# Patient Record
Sex: Female | Born: 1950 | Race: Black or African American | Hispanic: No | Smoking: Never smoker
Health system: Southern US, Community
[De-identification: ages and names within clinical notes are randomized; demographics above are authoritative.]

## PROBLEM LIST (undated history)

## (undated) DIAGNOSIS — E785 Hyperlipidemia, unspecified: Secondary | ICD-10-CM

## (undated) DIAGNOSIS — T7840XA Allergy, unspecified, initial encounter: Secondary | ICD-10-CM

## (undated) DIAGNOSIS — D649 Anemia, unspecified: Secondary | ICD-10-CM

## (undated) DIAGNOSIS — K219 Gastro-esophageal reflux disease without esophagitis: Secondary | ICD-10-CM

## (undated) DIAGNOSIS — R569 Unspecified convulsions: Secondary | ICD-10-CM

## (undated) HISTORY — PX: NO PAST SURGERIES: SHX2092

## (undated) HISTORY — DX: Hyperlipidemia, unspecified: E78.5

## (undated) HISTORY — DX: Anemia, unspecified: D64.9

## (undated) HISTORY — DX: Unspecified convulsions: R56.9

## (undated) HISTORY — DX: Gastro-esophageal reflux disease without esophagitis: K21.9

## (undated) HISTORY — DX: Allergy, unspecified, initial encounter: T78.40XA

## (undated) HISTORY — PX: TUBAL LIGATION: SHX77

---

## 2000-02-18 ENCOUNTER — Encounter (INDEPENDENT_AMBULATORY_CARE_PROVIDER_SITE_OTHER): Payer: Self-pay

## 2000-02-18 ENCOUNTER — Other Ambulatory Visit: Admission: RE | Admit: 2000-02-18 | Discharge: 2000-02-18 | Payer: Self-pay | Admitting: Gynecology

## 2000-08-22 ENCOUNTER — Ambulatory Visit (HOSPITAL_COMMUNITY): Admission: RE | Admit: 2000-08-22 | Discharge: 2000-08-22 | Payer: Self-pay | Admitting: Gastroenterology

## 2001-05-01 ENCOUNTER — Other Ambulatory Visit: Admission: RE | Admit: 2001-05-01 | Discharge: 2001-05-01 | Payer: Self-pay | Admitting: Gynecology

## 2002-06-13 ENCOUNTER — Other Ambulatory Visit: Admission: RE | Admit: 2002-06-13 | Discharge: 2002-06-13 | Payer: Self-pay | Admitting: Gynecology

## 2003-12-18 ENCOUNTER — Other Ambulatory Visit: Admission: RE | Admit: 2003-12-18 | Discharge: 2003-12-18 | Payer: Self-pay | Admitting: Gynecology

## 2005-02-03 ENCOUNTER — Other Ambulatory Visit: Admission: RE | Admit: 2005-02-03 | Discharge: 2005-02-03 | Payer: Self-pay | Admitting: Gynecology

## 2005-05-13 ENCOUNTER — Encounter: Admission: RE | Admit: 2005-05-13 | Discharge: 2005-06-04 | Payer: Self-pay | Admitting: Family Medicine

## 2005-06-14 DIAGNOSIS — R569 Unspecified convulsions: Secondary | ICD-10-CM

## 2005-06-14 HISTORY — DX: Unspecified convulsions: R56.9

## 2005-06-24 ENCOUNTER — Emergency Department (HOSPITAL_COMMUNITY): Admission: EM | Admit: 2005-06-24 | Discharge: 2005-06-24 | Payer: Self-pay | Admitting: Emergency Medicine

## 2005-07-20 ENCOUNTER — Encounter: Admission: RE | Admit: 2005-07-20 | Discharge: 2005-07-20 | Payer: Self-pay | Admitting: Neurology

## 2005-09-13 ENCOUNTER — Emergency Department (HOSPITAL_COMMUNITY): Admission: EM | Admit: 2005-09-13 | Discharge: 2005-09-13 | Payer: Self-pay | Admitting: Emergency Medicine

## 2006-05-07 ENCOUNTER — Encounter: Admission: RE | Admit: 2006-05-07 | Discharge: 2006-05-07 | Payer: Self-pay | Admitting: Neurology

## 2007-05-24 ENCOUNTER — Encounter (INDEPENDENT_AMBULATORY_CARE_PROVIDER_SITE_OTHER): Payer: Self-pay | Admitting: Obstetrics and Gynecology

## 2007-05-24 ENCOUNTER — Ambulatory Visit (HOSPITAL_COMMUNITY): Admission: RE | Admit: 2007-05-24 | Discharge: 2007-05-24 | Payer: Self-pay | Admitting: Obstetrics and Gynecology

## 2010-06-14 HISTORY — PX: COLONOSCOPY: SHX174

## 2010-10-27 NOTE — Op Note (Signed)
Katie Mcneil, SHARPS              ACCOUNT NO.:  [id number]   MEDICAL RECORD NO.:  [id number]          PATIENT TYPE:  AMB   LOCATION:  SDC                           FACILITY:  [facility]   PHYSICIAN:  Michelle L. Grewal, M.D.DATE OF BIRTH:  June 01, 1951   DATE OF PROCEDURE:  05/24/2007  DATE OF DISCHARGE:                               OPERATIVE REPORT   PREOPERATIVE DIAGNOSIS:  Endometrial polyps.   POSTOPERATIVE DIAGNOSIS:  Endometrial polyps.   PROCEDURE:  Dilation and curettage, hysteroscopy, ThermaChoice  endometrial ablation.   SURGEON:  Michelle L. Vincente Poli, M.D.   ANESTHESIA:  MAC with local.   FINDINGS:  Endometrial polyps.   PROCEDURE:  Patient taken to the operating room.  She was prepped and  draped in usual sterile fashion.  Anesthesia was of course administered.  A speculum was placed in the vagina.  The cervix was grasped with a  tenaculum.  A paracervical block was performed in standard fashion.  The  cervical internal os was gently dilated using Pratt dilators.  The  diagnostic hysteroscope was inserted with excellent visualization of the  uterine cavity.  There were a couple of small polypoid areas that were  noted.  This was very consistent with preoperative in-office  sonohysterogram.  The hysteroscope was removed.  A thorough sharp  uterine curettage was performed and all tissue was sent to pathology.  The tissue did appear to be polypoid in appearance.  We then inserted  the ThermaChoice III balloon and the ThermaChoice endometrial ablation  was performed according to the manufacturer's specifications for an 8  minute cycle.  At the end of the procedure the intact balloon was  removed.  There was no bleeding noted.  The patient tolerated procedure  extremely well.  All instruments were removed from the vagina.  All  sponge, lap and instrument counts were correct x2.      Michelle L. Vincente Poli, M.D.  Electronically Signed     MLG/MEDQ  D:  05/24/2007  T:   05/24/2007  Job:  [id number]

## 2010-10-30 NOTE — Procedures (Signed)
[facility]  Patient:    Katie Mcneil, Katie Mcneil                       MRN: [id number] Proc. Date: 08/22/00 Attending:  Everardo All. Madilyn Fireman, M.D. CC:         Gretta Cool, M.D.   Procedure Report  PROCEDURE:  Colonoscopy.  GASTROENTEROLOGIST:  Everardo All. Madilyn Fireman, M.D.  INDICATIONS FOR PROCEDURE:  Heme-positive stools and anemia.  DESCRIPTION OF PROCEDURE:   The patient was placed in the left lateral decubitus position and placed on the pulse monitor with continuous low-flow oxygen delivered by nasal cannula.  She was sedated with 70 mg IV Demerol and 7 mg IV Versed.  The Olympus video colonoscope was inserted into the rectum and advanced to the cecum, confirmed by transillumination of McBurneys point and visualization of the ileocecal valve and appendiceal orifice.  The prep was excellent.  The cecum, ascending, transverse, descending, and sigmoid colon all appeared normal with no masses, polyps, diverticula, or other mucosal abnormalities.  The rectum likewise appeared normal on retroflexed view.  The anus revealed only small internal hemorrhoids.  The colonoscope was the withdrawn, and the patient returned to the recovery room in stable condition.  She tolerated the procedure well, and there were no immediate complications.  IMPRESSIONS:  Internal hemorrhoids, otherwise normal colonoscopy.  PLAN:  Will repeat hemoccults in two to three weeks and consider upper GI evaluation if persistently positive. DD:  08/22/00 TD:  08/22/00 Job: [id number]

## 2011-03-22 LAB — CBC
HCT: 34.4 — ABNORMAL LOW
MCHC: 33.5
Platelets: 349
RBC: 3.84 — ABNORMAL LOW

## 2011-06-04 LAB — HM COLONOSCOPY

## 2011-12-02 ENCOUNTER — Other Ambulatory Visit: Payer: Self-pay | Admitting: Obstetrics and Gynecology

## 2012-10-12 ENCOUNTER — Other Ambulatory Visit: Payer: Self-pay | Admitting: Neurology

## 2012-12-21 ENCOUNTER — Other Ambulatory Visit: Payer: Self-pay | Admitting: Obstetrics and Gynecology

## 2013-10-28 ENCOUNTER — Other Ambulatory Visit: Payer: Self-pay | Admitting: Neurology

## 2013-10-30 ENCOUNTER — Encounter: Payer: Self-pay | Admitting: Adult Health

## 2013-11-01 ENCOUNTER — Encounter (INDEPENDENT_AMBULATORY_CARE_PROVIDER_SITE_OTHER): Payer: Self-pay

## 2013-11-01 ENCOUNTER — Ambulatory Visit (INDEPENDENT_AMBULATORY_CARE_PROVIDER_SITE_OTHER): Payer: No Typology Code available for payment source | Admitting: Adult Health

## 2013-11-01 ENCOUNTER — Encounter: Payer: Self-pay | Admitting: Adult Health

## 2013-11-01 VITALS — BP 106/84 | HR 64 | Ht 66.0 in | Wt 152.0 lb

## 2013-11-01 DIAGNOSIS — R413 Other amnesia: Secondary | ICD-10-CM

## 2013-11-01 DIAGNOSIS — Z79899 Other long term (current) drug therapy: Secondary | ICD-10-CM

## 2013-11-01 DIAGNOSIS — G40309 Generalized idiopathic epilepsy and epileptic syndromes, not intractable, without status epilepticus: Secondary | ICD-10-CM

## 2013-11-01 NOTE — Patient Instructions (Signed)
Seizure, Adult A seizure means there is unusual activity in the brain. A seizure can cause changes in attention or behavior. Seizures often cause shaking (convulsions). Seizures often last from 30 seconds to 2 minutes. HOME CARE   If you are given medicines, take them exactly as told by your doctor.  Keep all doctor visits as told.  Do not swim or drive until your doctor says it is okay.  Teach others what to do if you have a seizure. They should:  Lay you on the ground.  Put a cushion under your head.  Loosen any tight clothing around your neck.  Turn you on your side.  Stay with you until you get better. GET HELP RIGHT AWAY IF:   The seizure lasts longer than 2 to 5 minutes.  The seizure is very bad.  The person does not wake up after the seizure.  The person's attention or behavior changes. Drive the person to the emergency room or call your local emergency services (911 in [place] MAKE SURE YOU:   Understand these instructions.  Will watch your condition.  Will get help right away if you are not doing well or get worse. Document Released: 11/17/2007 Document Revised: 08/23/2011 Document Reviewed: 05/19/2011 Exit[employer] Patient Information 2014 [employer].  

## 2013-11-01 NOTE — Progress Notes (Addendum)
PATIENT: Katie Mcneil DOB: 1950-07-24  REASON FOR VISIT: follow up HISTORY FROM: patient  HISTORY OF PRESENT ILLNESS: Katie Mcneil is a 63 year old right handed black female with a history of seizures. She returns today for follow-up. Patient currently takes Lamictal. The patient has not had a seizure since may 2006. She has lost both of her jobs and she states its due to the side effects of Lamictal. She states it affects her memory and concentration. Feels like she is foggy headed and can't think clearly. Notices that she went to a team building class and she was told to build an object with legos and she states she could not remember how to do it. States she felt like she needed a manual with step by step instructions. Expresses that this is also what happened at her job. She feels that this has been a gradual decline. She did not abruptly notice a change when she started the medication several years ago. 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   REVIEW OF SYSTEMS: Full 14 system review of systems performed and notable only for:  Constitutional: N/A  Eyes: N/A Ear/Nose/Throat: N/A  Skin: N/A  Cardiovascular: N/A  Respiratory: N/A  Gastrointestinal: N/A  Genitourinary: N/A Hematology/Lymphatic: N/A  Endocrine: N/A Musculoskeletal:N/A  Allergy/Immunology: N/A  Neurological: memory Psychiatric: decreased concentration  Sleep: restless leg   ALLERGIES: No Known Allergies  HOME MEDICATIONS: Outpatient Prescriptions Prior to Visit  Medication Sig Dispense Refill  . lamoTRIgine (LAMICTAL) 150 MG tablet TAKE 1 TABLET BY MOUTH TWICE A DAY  60 tablet  0   No facility-administered medications prior to visit.    PAST MEDICAL HISTORY: Past Medical History  Diagnosis Date  . Seizures   . Dyslipidemia     PAST SURGICAL HISTORY: History reviewed. No pertinent past surgical history.  FAMILY HISTORY: Family History  Problem Relation Age of Onset  . Seizures  Daughter   . Other Daughter     Psychological disorder    SOCIAL HISTORY: History   Social History  . Marital Status: Widowed    Spouse Name: N/A    Number of Children: 2  . Years of Education: N/A   Occupational History  . Not on file.   Social History Main Topics  . Smoking status: Never Smoker   . Smokeless tobacco: Never Used  . Alcohol Use: Yes     Comment: occassion  . Drug Use: No  . Sexual Activity: Not on file   Other Topics Concern  . Not on file   Social History Narrative   Patient is widowed and lives alone.   Patient is right-handed.   Patient has two children.   Patient is working full-time.      PHYSICAL EXAM  Filed Vitals:   11/01/13 1429  BP: 106/84  Pulse: 64  Height: 5\' 6"  (1.676 m)  Weight: 152 lb (68.947 kg)   Body mass index is 24.55 kg/(m^2). Generalized: Well developed, in no acute distress   Neurological examination  Mentation: Alert oriented to time, place, history taking. Follows all commands speech and language fluent Cranial nerve II-XII: Extraocular movements were full, visual field were full on confrontational test.  Motor: The motor testing reveals 5 over 5 strength of all 4 extremities. Good symmetric motor tone is noted throughout.  Sensory: Sensory testing is intact to soft touch on all 4 extremities. No evidence of extinction is noted.  Coordination: Cerebellar testing reveals good finger-nose-finger and heel-to-shin bilaterally however the  patient is hesitant.  Gait and station: Gait is normal. Tandem gait is unsteady. Romberg is negative. No drift is seen.  Reflexes: Deep tendon reflexes are symmetric and normal bilaterally.    DIAGNOSTIC DATA (LABS, IMAGING, TESTING) - I reviewed patient records, labs, notes, testing and imaging myself where available.  Lab Results  Component Value Date   WBC 5.1 05/22/2007   HGB 11.5* 05/22/2007   HCT 34.4* 05/22/2007   MCV 89.4 05/22/2007   PLT 349 05/22/2007    ASSESSMENT AND  PLAN 63 y.o. year old female  has a past medical history of Seizures and Dyslipidemia. here with:  1. Generalized convulsive epilepsy without mention of intractable epilepsy 2. Memory deficits 3. Encounter for long-term (current) use of other medications  Patient complains that she has lost two jobs due to the side effects of Lamictal. She feels that she can't concentrate and is foggy headed with some memory deficits. She states that the Lamictal has controlled her seizures well so she is very reluctant to change seizure medications. She is also concerned of the possible side effects of other seizure medications. I consulted with Dr. Jannifer Franklin and he spoke with the patient and completed a physical exam as well. At this time we will continue the Lamictal but we will check blood work looking for other possible causes of memory loss. We will also check an MRI of the brain to look for any acute changes that could affect her memory. I will call the patient with the results. Patient should follow up in 6 months or sooner if needed.    Ward Givens, MSN, NP-C 11/01/2013, 2:35 PM [facility] [address], [place], [place] [address] [phone]  Note: This document was prepared with digital dictation and possible smart phrase technology. Any transcriptional errors that result from this process are unintentional. Kathrynn Ducking

## 2013-11-02 LAB — CBC WITH DIFFERENTIAL
BASOS ABS: 0 10*3/uL (ref 0.0–0.2)
Basos: 1 %
EOS ABS: 0.2 10*3/uL (ref 0.0–0.4)
Eos: 5 %
HEMATOCRIT: 35.9 % (ref 34.0–46.6)
Hemoglobin: 11.4 g/dL (ref 11.1–15.9)
IMMATURE GRANS (ABS): 0 10*3/uL (ref 0.0–0.1)
IMMATURE GRANULOCYTES: 0 %
LYMPHS ABS: 2.2 10*3/uL (ref 0.7–3.1)
LYMPHS: 44 %
MCH: 28.1 pg (ref 26.6–33.0)
MCHC: 31.8 g/dL (ref 31.5–35.7)
MCV: 88 fL (ref 79–97)
MONOS ABS: 0.4 10*3/uL (ref 0.1–0.9)
Monocytes: 9 %
NEUTROS ABS: 2 10*3/uL (ref 1.4–7.0)
Neutrophils Relative %: 41 %
PLATELETS: 303 10*3/uL (ref 150–379)
RBC: 4.06 x10E6/uL (ref 3.77–5.28)
RDW: 14.4 % (ref 12.3–15.4)
WBC: 4.9 10*3/uL (ref 3.4–10.8)

## 2013-11-02 LAB — COMPREHENSIVE METABOLIC PANEL
A/G RATIO: 2 (ref 1.1–2.5)
ALBUMIN: 4.8 g/dL (ref 3.6–4.8)
ALK PHOS: 88 IU/L (ref 39–117)
ALT: 20 IU/L (ref 0–32)
AST: 30 IU/L (ref 0–40)
BILIRUBIN TOTAL: 0.4 mg/dL (ref 0.0–1.2)
BUN/Creatinine Ratio: 17 (ref 11–26)
BUN: 12 mg/dL (ref 8–27)
CO2: 25 mmol/L (ref 18–29)
Calcium: 9.7 mg/dL (ref 8.7–10.3)
Chloride: 101 mmol/L (ref 97–108)
Creatinine, Ser: 0.69 mg/dL (ref 0.57–1.00)
GFR calc non Af Amer: 94 mL/min/{1.73_m2} (ref >59)
GFR, EST AFRICAN AMERICAN: 109 mL/min/{1.73_m2} (ref >59)
GLOBULIN, TOTAL: 2.4 g/dL (ref 1.5–4.5)
GLUCOSE: 88 mg/dL (ref 65–99)
Potassium: 3.9 mmol/L (ref 3.5–5.2)
SODIUM: 142 mmol/L (ref 134–144)
TOTAL PROTEIN: 7.2 g/dL (ref 6.0–8.5)

## 2013-11-02 LAB — TSH: TSH: 1.44 u[IU]/mL (ref 0.450–4.500)

## 2013-11-02 LAB — RPR: SYPHILIS RPR SCR: NONREACTIVE

## 2013-11-02 LAB — VITAMIN B12: VITAMIN B 12: 1345 pg/mL — AB (ref 211–946)

## 2013-11-02 LAB — HIV ANTIBODY (ROUTINE TESTING W REFLEX): HIV 1/HIV 2 AB: NONREACTIVE

## 2013-11-02 NOTE — Progress Notes (Signed)
Quick Note:  Called patient and shared normal labs , she verbalized understanding ______

## 2013-11-15 ENCOUNTER — Other Ambulatory Visit: Payer: Self-pay

## 2013-11-28 ENCOUNTER — Other Ambulatory Visit: Payer: Self-pay | Admitting: Neurology

## 2014-05-01 ENCOUNTER — Encounter: Payer: Self-pay | Admitting: Neurology

## 2014-05-07 ENCOUNTER — Encounter: Payer: Self-pay | Admitting: Neurology

## 2014-05-08 ENCOUNTER — Telehealth: Payer: Self-pay | Admitting: Nurse Practitioner

## 2014-05-08 ENCOUNTER — Ambulatory Visit: Payer: Self-pay | Admitting: Nurse Practitioner

## 2014-05-08 NOTE — Telephone Encounter (Signed)
Patient was no show for today's office appointment.  

## 2014-05-13 ENCOUNTER — Encounter: Payer: Self-pay | Admitting: *Deleted

## 2014-06-23 ENCOUNTER — Other Ambulatory Visit: Payer: Self-pay | Admitting: Neurology

## 2014-06-25 NOTE — Telephone Encounter (Signed)
Patient No showed last appt.  Called patient, got no answer.  Left message.

## 2014-07-09 ENCOUNTER — Other Ambulatory Visit: Payer: Self-pay | Admitting: Neurology

## 2014-07-09 NOTE — Telephone Encounter (Signed)
Patient no showed last appt.  I called, got no answer.  Left message.

## 2014-08-05 ENCOUNTER — Other Ambulatory Visit: Payer: Self-pay | Admitting: Neurology

## 2014-08-06 NOTE — Telephone Encounter (Signed)
Patient no showed last appt.  I called, got no answer.  Left message.

## 2014-08-19 ENCOUNTER — Other Ambulatory Visit: Payer: Self-pay | Admitting: Neurology

## 2014-08-28 ENCOUNTER — Telehealth: Payer: Self-pay | Admitting: Adult Health

## 2014-08-28 MED ORDER — LAMOTRIGINE 150 MG PO TABS: 150.0000 mg | ORAL_TABLET | Freq: Two times a day (BID) | ORAL | Status: DC

## 2014-08-28 NOTE — Telephone Encounter (Signed)
Patient stated her Rx lamoTRIgine (LAMICTAL) 150 MG tablet she picked up on the 9th was enough for 2.5 weeks, patient is still short 1 week worth of medication.  She's scheduled to see Jinny Blossom, NP on 3/30 and thought she was getting enough medication to last until she came into office.   She takes 1 tablet 2 x's daily and cost is very expensive for 2 wk supply.  Questioning if a 30 day supply could be forwarded to [facility].  Please call and advise

## 2014-08-28 NOTE — Telephone Encounter (Signed)
Rx has been sent.  I called back to advise.  Got no answer.  Left message.   

## 2014-09-04 ENCOUNTER — Other Ambulatory Visit: Payer: Self-pay | Admitting: Neurology

## 2014-09-04 NOTE — Telephone Encounter (Signed)
#  [phone] was sent on 03/16

## 2014-09-11 ENCOUNTER — Encounter: Payer: Self-pay | Admitting: Adult Health

## 2014-09-11 ENCOUNTER — Ambulatory Visit (INDEPENDENT_AMBULATORY_CARE_PROVIDER_SITE_OTHER): Payer: No Typology Code available for payment source | Admitting: Adult Health

## 2014-09-11 VITALS — BP 110/70 | HR 70 | Ht 66.0 in | Wt 160.0 lb

## 2014-09-11 DIAGNOSIS — G40309 Generalized idiopathic epilepsy and epileptic syndromes, not intractable, without status epilepticus: Secondary | ICD-10-CM | POA: Diagnosis not present

## 2014-09-11 DIAGNOSIS — Z5181 Encounter for therapeutic drug level monitoring: Secondary | ICD-10-CM

## 2014-09-11 MED ORDER — LAMOTRIGINE 150 MG PO TABS: 150.0000 mg | ORAL_TABLET | Freq: Two times a day (BID) | ORAL | Status: DC

## 2014-09-11 NOTE — Progress Notes (Signed)
I have read the note, and I agree with the clinical assessment and plan.  Teandra Harlan KEITH   

## 2014-09-11 NOTE — Patient Instructions (Signed)
Continue Lamictal. I will check blood work today.  Please let us know if you have any seizure events.

## 2014-09-11 NOTE — Progress Notes (Signed)
PATIENT: Katie Mcneil DOB: 1951/04/10  REASON FOR VISIT: follow up- seizures HISTORY FROM: patient  HISTORY OF PRESENT ILLNESS: Katie Mcneil is a 64 year old female with a history of seizures. She returns today for follow-up. She is currently taking Lamictal 150 mg twice a day. She is tolerating this well. She denies any seizure events. She is able to complete all ADLs independently. She operates a Teacher, music without difficulty. The patient states that she recently got a new job and is doing well. Patient states that occasionally she'll feel "foggy headed" but she is able to compensate for this. Overall she states that she is doing well. She states she'll occasionally have some fatigue. She denies any new neurological symptoms. She returns today for evaluation.  HISTORY 11/01/13: Katie Mcneil is a 64 year old right handed black female with a history of seizures. She returns today for follow-up. Patient currently takes Lamictal. The patient has not had a seizure since may 2006. She has lost both of her jobs and she states its due to the side effects of Lamictal. She states it affects her memory and concentration. Feels like she is foggy headed and can't think clearly. Notices that she went to a team building class and she was told to build an object with legos and she states she could not remember how to do it. States she felt like she needed a manual with step by step instructions. Expresses that this is also what happened at her job. She feels that this has been a gradual decline. She did not abruptly notice a change when she started the medication several years ago. 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   REVIEW OF SYSTEMS: Out of a complete 14 system review of symptoms, the patient complains only of the following symptoms, and all other reviewed systems are negative.  Restless leg, cold intolerance, environmental allergies  ALLERGIES: No Known Allergies  HOME  MEDICATIONS: Outpatient Prescriptions Prior to Visit  Medication Sig Dispense Refill  . lamoTRIgine (LAMICTAL) 150 MG tablet Take 1 tablet (150 mg total) by mouth 2 (two) times daily. 60 tablet 0  . prednisoLONE acetate (PRED FORTE) 1 % ophthalmic suspension      No facility-administered medications prior to visit.    PAST MEDICAL HISTORY: Past Medical History  Diagnosis Date  . Seizures   . Dyslipidemia     PAST SURGICAL HISTORY: Past Surgical History  Procedure Laterality Date  . No past surgeries      FAMILY HISTORY: Family History  Problem Relation Age of Onset  . Seizures Daughter   . Other Daughter     Psychological disorder    SOCIAL HISTORY: History   Social History  . Marital Status: Widowed    Spouse Name: N/A  . Number of Children: 2  . Years of Education: N/A   Occupational History  . Not on file.   Social History Main Topics  . Smoking status: Never Smoker   . Smokeless tobacco: Never Used  . Alcohol Use: 0.0 oz/week    0 Standard drinks or equivalent per week     Comment: occassion  . Drug Use: No  . Sexual Activity: Not on file   Other Topics Concern  . Not on file   Social History Narrative   Patient is widowed and lives alone.   Patient is right-handed.   Patient has two children.   Patient is working full-time.      PHYSICAL EXAM  Filed Vitals:  09/11/14 1524  BP: 110/70  Pulse: 70  Height: 5\' 6"  (1.676 m)  Weight: 160 lb (72.576 kg)   Body mass index is 25.84 kg/(m^2).  Generalized: Well developed, in no acute distress   Neurological examination  Mentation: Alert oriented to time, place, history taking. Follows all commands speech and language fluent. MMSE 29/30 Cranial nerve II-XII: Pupils were equal round reactive to light. Extraocular movements were full, visual field were full on confrontational test. Facial sensation and strength were normal. Uvula tongue midline. Head turning and shoulder shrug  were normal and  symmetric. Motor: The motor testing reveals 5 over 5 strength of all 4 extremities. Good symmetric motor tone is noted throughout.  Sensory: Sensory testing is intact to soft touch on all 4 extremities. No evidence of extinction is noted.  Coordination: Cerebellar testing reveals good finger-nose-finger and heel-to-shin bilaterally.  Gait and station: Gait is normal. Tandem gait is normal. Romberg is negative. No drift is seen.  Reflexes: Deep tendon reflexes are symmetric and normal bilaterally.     DIAGNOSTIC DATA (LABS, IMAGING, TESTING) - I reviewed patient records, labs, notes, testing and imaging myself where available.  Lab Results  Component Value Date   WBC 4.9 11/01/2013   HGB 11.4 11/01/2013   HCT 35.9 11/01/2013   MCV 88 11/01/2013   PLT 303 11/01/2013      Component Value Date/Time   NA 142 11/01/2013 1530   K 3.9 11/01/2013 1530   CL 101 11/01/2013 1530   CO2 25 11/01/2013 1530   GLUCOSE 88 11/01/2013 1530   BUN 12 11/01/2013 1530   CREATININE 0.69 11/01/2013 1530   CALCIUM 9.7 11/01/2013 1530   PROT 7.2 11/01/2013 1530   AST 30 11/01/2013 1530   ALT 20 11/01/2013 1530   ALKPHOS 88 11/01/2013 1530   BILITOT 0.4 11/01/2013 1530   GFRNONAA 94 11/01/2013 1530   GFRAA 109 11/01/2013 1530     Lab Results  Component Value Date   VITAMINB12 1345* 11/01/2013   Lab Results  Component Value Date   TSH 1.440 11/01/2013      ASSESSMENT AND PLAN 64 y.o. year old female  has a past medical history of Seizures and Dyslipidemia. here with:  1. Seizures  Overall the patient is doing well. She will continue taking Lamictal 150 mg twice a day. Her memory score has remained stable. MMSE 29/30. I will check blood work today. If she has any additional seizure events she'll let us know. Otherwise she will follow-up in one year.    Ward Givens, MSN, NP-C 09/11/2014, 3:32 PM [facility] [address], [place], [place] [address] [phone]  Note: This document was prepared with digital dictation and possible smart phrase technology. Any transcriptional errors that result from this process are unintentional.

## 2014-09-12 LAB — CBC WITH DIFFERENTIAL/PLATELET
BASOS: 1 %
Basophils Absolute: 0 10*3/uL (ref 0.0–0.2)
EOS: 7 %
Eosinophils Absolute: 0.3 10*3/uL (ref 0.0–0.4)
HEMATOCRIT: 37.4 % (ref 34.0–46.6)
HEMOGLOBIN: 12 g/dL (ref 11.1–15.9)
IMMATURE GRANULOCYTES: 0 %
Immature Grans (Abs): 0 10*3/uL (ref 0.0–0.1)
LYMPHS ABS: 2.4 10*3/uL (ref 0.7–3.1)
Lymphs: 53 %
MCH: 27.9 pg (ref 26.6–33.0)
MCHC: 32.1 g/dL (ref 31.5–35.7)
MCV: 87 fL (ref 79–97)
MONOCYTES: 9 %
MONOS ABS: 0.4 10*3/uL (ref 0.1–0.9)
NEUTROS ABS: 1.4 10*3/uL (ref 1.4–7.0)
Neutrophils Relative %: 30 %
PLATELETS: 303 10*3/uL (ref 150–379)
RBC: 4.3 x10E6/uL (ref 3.77–5.28)
RDW: 14.1 % (ref 12.3–15.4)
WBC: 4.5 10*3/uL (ref 3.4–10.8)

## 2014-09-12 LAB — COMPREHENSIVE METABOLIC PANEL
A/G RATIO: 1.9 (ref 1.1–2.5)
ALBUMIN: 4.6 g/dL (ref 3.6–4.8)
ALK PHOS: 95 IU/L (ref 39–117)
ALT: 25 IU/L (ref 0–32)
AST: 25 IU/L (ref 0–40)
BUN / CREAT RATIO: 17 (ref 11–26)
BUN: 11 mg/dL (ref 8–27)
Bilirubin Total: 0.5 mg/dL (ref 0.0–1.2)
CALCIUM: 9.5 mg/dL (ref 8.7–10.3)
CO2: 25 mmol/L (ref 18–29)
CREATININE: 0.65 mg/dL (ref 0.57–1.00)
Chloride: 101 mmol/L (ref 97–108)
GFR calc Af Amer: 110 mL/min/{1.73_m2} (ref >59)
GFR, EST NON AFRICAN AMERICAN: 95 mL/min/{1.73_m2} (ref >59)
GLOBULIN, TOTAL: 2.4 g/dL (ref 1.5–4.5)
Glucose: 92 mg/dL (ref 65–99)
Potassium: 4.2 mmol/L (ref 3.5–5.2)
Sodium: 142 mmol/L (ref 134–144)
Total Protein: 7 g/dL (ref 6.0–8.5)

## 2014-09-12 LAB — LAMOTRIGINE LEVEL: Lamotrigine Lvl: 10.3 ug/mL (ref 2.0–20.0)

## 2014-12-20 ENCOUNTER — Ambulatory Visit
Admission: RE | Admit: 2014-12-20 | Discharge: 2014-12-20 | Disposition: A | Discharge status: Home / Self Care | Payer: PRIVATE HEALTH INSURANCE | Source: Ambulatory Visit | Attending: Family Medicine | Admitting: Family Medicine

## 2014-12-20 ENCOUNTER — Other Ambulatory Visit: Payer: Self-pay | Admitting: Family Medicine

## 2014-12-20 DIAGNOSIS — Z021 Encounter for pre-employment examination: Secondary | ICD-10-CM

## 2015-09-09 ENCOUNTER — Encounter: Payer: Self-pay | Admitting: Adult Health

## 2015-09-09 ENCOUNTER — Ambulatory Visit (INDEPENDENT_AMBULATORY_CARE_PROVIDER_SITE_OTHER): Payer: BLUE CROSS/BLUE SHIELD | Admitting: Adult Health

## 2015-09-09 VITALS — BP 123/73 | HR 71 | Resp 14 | Ht 66.0 in | Wt 160.0 lb

## 2015-09-09 DIAGNOSIS — R569 Unspecified convulsions: Secondary | ICD-10-CM | POA: Diagnosis not present

## 2015-09-09 DIAGNOSIS — Z5181 Encounter for therapeutic drug level monitoring: Secondary | ICD-10-CM

## 2015-09-09 MED ORDER — LAMOTRIGINE 150 MG PO TABS
150.0000 mg | ORAL_TABLET | Freq: Two times a day (BID) | ORAL | Status: DC
Start: 2015-09-09 — End: 2016-08-09

## 2015-09-09 NOTE — Patient Instructions (Signed)
Continue lamictal  Blood work today If your symptoms worsen or you develop new symptoms please let us know.

## 2015-09-09 NOTE — Progress Notes (Signed)
I have read the note, and I agree with the clinical assessment and plan.  Yashar Inclan KEITH   

## 2015-09-09 NOTE — Progress Notes (Signed)
PATIENT: Katie Mcneil DOB: [birth year]  REASON FOR VISIT: follow up- seizures HISTORY FROM: patient  HISTORY OF PRESENT ILLNESS: Katie Mcneil is a 65 year old female with a history of seizures. She returns today for follow-up. She is currently taking Lamictal 150 mg twice a day. She is tolerating this medication well. She denies any new seizure events. She states that occasionally she will feel "foggy headed" at work but this is not affecting her job at this time. At home she is able to complete all ADLs and apparently. She operates a Teacher, music without difficulty. She returns today for an evaluation.  HISTORY 09/11/14: Katie Mcneil is a 65 year old female with a history of seizures. She returns today for follow-up. She is currently taking Lamictal 150 mg twice a day. She is tolerating this well. She denies any seizure events. She is able to complete all ADLs independently. She operates a Teacher, music without difficulty. The patient states that she recently got a new job and is doing well. Patient states that occasionally she'll feel "foggy headed" but she is able to compensate for this. Overall she states that she is doing well. She states she'll occasionally have some fatigue. She denies any new neurological symptoms. She returns today for evaluation.  HISTORY 11/01/13: Katie Mcneil is a 65 year old right handed black female with a history of seizures. She returns today for follow-up. Patient currently takes Lamictal. The patient has not had a seizure since may 2006. She has lost both of her jobs and she states its due to the side effects of Lamictal. She states it affects her memory and concentration. Feels like she is foggy headed and can't think clearly. Notices that she went to a team building class and she was told to build an object with legos and she states she could not remember how to do it. States she felt like she needed a manual with step by step instructions. Expresses that this is also  what happened at her job. She feels that this has been a gradual decline. She did not abruptly notice a change when she started the medication several years ago. 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  REVIEW OF SYSTEMS: Out of a complete 14 system review of symptoms, the patient complains only of the following symptoms, and all other reviewed systems are negative.  Environmental allergies  ALLERGIES: No Known Allergies  HOME MEDICATIONS: Outpatient Prescriptions Prior to Visit  Medication Sig Dispense Refill  . lamoTRIgine (LAMICTAL) 150 MG tablet Take 1 tablet (150 mg total) by mouth 2 (two) times daily. 60 tablet 11   No facility-administered medications prior to visit.    PAST MEDICAL HISTORY: Past Medical History  Diagnosis Date  . Seizures ([place])   . Dyslipidemia     PAST SURGICAL HISTORY: Past Surgical History  Procedure Laterality Date  . No past surgeries      FAMILY HISTORY: Family History  Problem Relation Age of Onset  . Seizures Daughter   . Other Daughter     Psychological disorder    SOCIAL HISTORY: Social History   Social History  . Marital Status: Widowed    Spouse Name: N/A  . Number of Children: 2  . Years of Education: N/A   Occupational History  . Not on file.   Social History Main Topics  . Smoking status: Never Smoker   . Smokeless tobacco: Never Used  . Alcohol Use: 0.0 oz/week    0 Standard drinks or equivalent per  week     Comment: occassion  . Drug Use: No  . Sexual Activity: Not on file   Other Topics Concern  . Not on file   Social History Narrative   Patient is widowed and lives alone.   Patient is right-handed.   Patient has two children.   Patient is working full-time.      PHYSICAL EXAM  Filed Vitals:   09/09/15 1533  Height: 5\' 6"  (1.676 m)  Weight: 160 lb (72.576 kg)   Body mass index is 25.84 kg/(m^2).  Generalized: Well developed, in no acute distress   Neurological examination    Mentation: Alert oriented to time, place, history taking. Follows all commands speech and language fluent Cranial nerve II-XII: Pupils were equal round reactive to light. Extraocular movements were full, visual field were full on confrontational test. Facial sensation and strength were normal. Uvula tongue midline. Head turning and shoulder shrug  were normal and symmetric. Motor: The motor testing reveals 5 over 5 strength of all 4 extremities. Good symmetric motor tone is noted throughout.  Sensory: Sensory testing is intact to soft touch on all 4 extremities. No evidence of extinction is noted.  Coordination: Cerebellar testing reveals good finger-nose-finger and heel-to-shin bilaterally.  Gait and station: Gait is normal. Tandem gait is normal. Romberg is negative. No drift is seen.  Reflexes: Deep tendon reflexes are symmetric and normal bilaterally.   DIAGNOSTIC DATA (LABS, IMAGING, TESTING) - I reviewed patient records, labs, notes, testing and imaging myself where available.  Lab Results  Component Value Date   WBC 4.5 09/11/2014   HGB 12.0 09/11/2014   HCT 37.4 09/11/2014   MCV 87 09/11/2014   PLT 303 09/11/2014      Component Value Date/Time   NA 142 09/11/2014 1550   K 4.2 09/11/2014 1550   CL 101 09/11/2014 1550   CO2 25 09/11/2014 1550   GLUCOSE 92 09/11/2014 1550   BUN 11 09/11/2014 1550   CREATININE 0.65 09/11/2014 1550   CALCIUM 9.5 09/11/2014 1550   PROT 7.0 09/11/2014 1550   ALBUMIN 4.6 09/11/2014 1550   AST 25 09/11/2014 1550   ALT 25 09/11/2014 1550   ALKPHOS 95 09/11/2014 1550   BILITOT 0.5 09/11/2014 1550   BILITOT 0.4 11/01/2013 1530   GFRNONAA 95 09/11/2014 1550   GFRAA 110 09/11/2014 1550       ASSESSMENT AND PLAN 65 y.o. year old female  has a past medical history of Seizures ([place]) and Dyslipidemia. here with:  1. Seizures  Overall the patient is doing well. She'll continue on Lamictal 150 mg twice a day. I will check blood work today.  Patient advised that if her symptoms worsen or she develops any new symptoms she should let us know. She will follow-up in one year or sooner if needed.     Ward Givens, MSN, NP-C 09/09/2015, 3:34 PM [facility] [address] [place], [place] [address] [phone]

## 2015-09-11 LAB — CBC WITH DIFFERENTIAL/PLATELET
BASOS: 1 %
Basophils Absolute: 0.1 10*3/uL (ref 0.0–0.2)
EOS (ABSOLUTE): 0.5 10*3/uL — ABNORMAL HIGH (ref 0.0–0.4)
EOS: 10 %
HEMATOCRIT: 37.9 % (ref 34.0–46.6)
HEMOGLOBIN: 12.1 g/dL (ref 11.1–15.9)
IMMATURE GRANULOCYTES: 0 %
Immature Grans (Abs): 0 10*3/uL (ref 0.0–0.1)
LYMPHS ABS: 2.1 10*3/uL (ref 0.7–3.1)
Lymphs: 43 %
MCH: 27.8 pg (ref 26.6–33.0)
MCHC: 31.9 g/dL (ref 31.5–35.7)
MCV: 87 fL (ref 79–97)
MONOCYTES: 8 %
MONOS ABS: 0.4 10*3/uL (ref 0.1–0.9)
Neutrophils Absolute: 1.9 10*3/uL (ref 1.4–7.0)
Neutrophils: 38 %
Platelets: 309 10*3/uL (ref 150–379)
RBC: 4.36 x10E6/uL (ref 3.77–5.28)
RDW: 14.2 % (ref 12.3–15.4)
WBC: 5.1 10*3/uL (ref 3.4–10.8)

## 2015-09-11 LAB — COMPREHENSIVE METABOLIC PANEL
ALBUMIN: 4.6 g/dL (ref 3.6–4.8)
ALK PHOS: 88 IU/L (ref 39–117)
ALT: 19 IU/L (ref 0–32)
AST: 25 IU/L (ref 0–40)
Albumin/Globulin Ratio: 1.9 (ref 1.2–2.2)
BUN / CREAT RATIO: 15 (ref 11–26)
BUN: 9 mg/dL (ref 8–27)
Bilirubin Total: 0.5 mg/dL (ref 0.0–1.2)
CALCIUM: 9.6 mg/dL (ref 8.7–10.3)
CO2: 22 mmol/L (ref 18–29)
CREATININE: 0.62 mg/dL (ref 0.57–1.00)
Chloride: 103 mmol/L (ref 96–106)
GFR calc Af Amer: 111 mL/min/{1.73_m2} (ref >59)
GFR, EST NON AFRICAN AMERICAN: 96 mL/min/{1.73_m2} (ref >59)
GLOBULIN, TOTAL: 2.4 g/dL (ref 1.5–4.5)
GLUCOSE: 102 mg/dL — AB (ref 65–99)
Potassium: 3.6 mmol/L (ref 3.5–5.2)
SODIUM: 143 mmol/L (ref 134–144)
TOTAL PROTEIN: 7 g/dL (ref 6.0–8.5)

## 2015-09-11 LAB — LAMOTRIGINE LEVEL: Lamotrigine Lvl: 13.8 ug/mL (ref 2.0–20.0)

## 2015-09-12 ENCOUNTER — Telehealth: Payer: Self-pay | Admitting: Adult Health

## 2015-09-12 NOTE — Telephone Encounter (Signed)
I called the patient and left a voicemail. Her lab work is normal.

## 2015-09-15 NOTE — Telephone Encounter (Signed)
I called and left a VM advised that her lab work was normal. If she has any additional questions she can call our office.

## 2016-08-09 ENCOUNTER — Other Ambulatory Visit: Payer: Self-pay | Admitting: *Deleted

## 2016-08-09 MED ORDER — LAMOTRIGINE 150 MG PO TABS: 150.0000 mg | ORAL_TABLET | Freq: Two times a day (BID) | ORAL | 4 refills | Status: DC

## 2016-08-09 NOTE — Telephone Encounter (Signed)
Has yrly appt scheduled.  Has come annually for the last 3 yrs.

## 2016-09-08 ENCOUNTER — Telehealth: Payer: Self-pay | Admitting: Neurology

## 2016-09-08 ENCOUNTER — Ambulatory Visit: Payer: BLUE CROSS/BLUE SHIELD | Admitting: Neurology

## 2016-09-08 NOTE — Telephone Encounter (Signed)
This patient did not show for a revisit appointment today. 

## 2016-09-14 ENCOUNTER — Encounter: Payer: Self-pay | Admitting: Neurology

## 2016-10-26 DIAGNOSIS — Z0289 Encounter for other administrative examinations: Secondary | ICD-10-CM

## 2016-12-27 ENCOUNTER — Ambulatory Visit: Payer: BLUE CROSS/BLUE SHIELD | Admitting: Neurology

## 2017-06-14 HISTORY — PX: CATARACT EXTRACTION W/ INTRAOCULAR LENS IMPLANT: SHX1309

## 2017-06-29 ENCOUNTER — Encounter: Payer: Self-pay | Admitting: Neurology

## 2017-06-29 ENCOUNTER — Ambulatory Visit (INDEPENDENT_AMBULATORY_CARE_PROVIDER_SITE_OTHER): Payer: BLUE CROSS/BLUE SHIELD | Admitting: Neurology

## 2017-06-29 ENCOUNTER — Other Ambulatory Visit: Payer: Self-pay

## 2017-06-29 VITALS — BP 125/76 | HR 90 | Ht 66.0 in | Wt 162.0 lb

## 2017-06-29 DIAGNOSIS — G40309 Generalized idiopathic epilepsy and epileptic syndromes, not intractable, without status epilepticus: Secondary | ICD-10-CM | POA: Diagnosis not present

## 2017-06-29 DIAGNOSIS — Z5181 Encounter for therapeutic drug level monitoring: Secondary | ICD-10-CM | POA: Diagnosis not present

## 2017-06-29 NOTE — Progress Notes (Signed)
    Reason for visit: Seizures  Katie Mcneil is an 67 y.o. female  History of present illness:  Katie Mcneil is a 67 year old right-handed black female with a history of seizures that have been under good control with Lamictal taking 150 mg twice daily.  The patient is working, she is operating a Teacher, music without difficulty.  She reports no significant problems with tolerating the Lamictal.  She recent was diagnosed with dyslipidemia, a statin drug was recommended but she has not yet started this.  The patient has not been seen in almost 2 years, but she indicates that she has not had any seizures during that period of time.  She returns for an evaluation.  Past Medical History:  Diagnosis Date  . Dyslipidemia   . Seizures ([place])     Past Surgical History:  Procedure Laterality Date  . NO PAST SURGERIES      Family History  Problem Relation Age of Onset  . Seizures Daughter   . Other Daughter        Psychological disorder    Social history:  reports that  has never smoked. she has never used smokeless tobacco. She reports that she drinks alcohol. She reports that she does not use drugs.   No Known Allergies  Medications:  Prior to Admission medications   Medication Sig Start Date End Date Taking? Authorizing Provider  lamoTRIgine (LAMICTAL) 150 MG tablet Take 1 tablet (150 mg total) by mouth 2 (two) times daily. 08/09/16  Yes Ward Givens, NP    ROS:  Out of a complete 14 system review of symptoms, the patient complains only of the following symptoms, and all other reviewed systems are negative.  Environmental allergies  Blood pressure 125/76, pulse 90, height 5\' 6"  (1.676 m), weight 162 lb (73.5 kg).  Physical Exam  General: The patient is alert and cooperative at the time of the examination.  Skin: No significant peripheral edema is noted.   Neurologic Exam  Mental status: The patient is alert and oriented x 3 at the time of the examination. The patient  has apparent normal recent and remote memory, with an apparently normal attention span and concentration ability.   Cranial nerves: Facial symmetry is present. Speech is normal, no aphasia or dysarthria is noted. Extraocular movements are full. Visual fields are full.  Motor: The patient has good strength in all 4 extremities.  Sensory examination: Soft touch sensation is symmetric on the face, arms, and legs.  Coordination: The patient has good finger-nose-finger and heel-to-shin bilaterally.  The patient appears to have difficulty following verbal directions.  Gait and station: The patient has a normal gait. Tandem gait is slightly unsteady. Romberg is negative. No drift is seen.  Reflexes: Deep tendon reflexes are symmetric.   Assessment/Plan:  1.  History of seizures, well controlled  The patient will have blood work done today, she will continue the Lamictal at the current dose and follow-up in 1 year.  She will contact our office of any new issues arise.  Jill Alexanders MD 06/29/2017 8:52 AM  [facility] [address] [place], [place] [address]  Phone [phone] Fax [phone]

## 2017-06-30 LAB — CBC WITH DIFFERENTIAL/PLATELET
Basophils Absolute: 0 10*3/uL (ref 0.0–0.2)
Basos: 1 %
EOS (ABSOLUTE): 0.2 10*3/uL (ref 0.0–0.4)
EOS: 4 %
HEMATOCRIT: 38.1 % (ref 34.0–46.6)
Hemoglobin: 11.8 g/dL (ref 11.1–15.9)
IMMATURE GRANULOCYTES: 0 %
Immature Grans (Abs): 0 10*3/uL (ref 0.0–0.1)
LYMPHS ABS: 1.2 10*3/uL (ref 0.7–3.1)
Lymphs: 22 %
MCH: 27.6 pg (ref 26.6–33.0)
MCHC: 31 g/dL — AB (ref 31.5–35.7)
MCV: 89 fL (ref 79–97)
MONOS ABS: 0.5 10*3/uL (ref 0.1–0.9)
Monocytes: 9 %
NEUTROS PCT: 64 %
Neutrophils Absolute: 3.4 10*3/uL (ref 1.4–7.0)
PLATELETS: 340 10*3/uL (ref 150–379)
RBC: 4.28 x10E6/uL (ref 3.77–5.28)
RDW: 14.6 % (ref 12.3–15.4)
WBC: 5.3 10*3/uL (ref 3.4–10.8)

## 2017-06-30 LAB — COMPREHENSIVE METABOLIC PANEL
A/G RATIO: 1.7 (ref 1.2–2.2)
ALK PHOS: 97 IU/L (ref 39–117)
ALT: 19 IU/L (ref 0–32)
AST: 21 IU/L (ref 0–40)
Albumin: 4.8 g/dL (ref 3.6–4.8)
BILIRUBIN TOTAL: 0.3 mg/dL (ref 0.0–1.2)
BUN / CREAT RATIO: 13 (ref 12–28)
BUN: 9 mg/dL (ref 8–27)
CHLORIDE: 104 mmol/L (ref 96–106)
CO2: 25 mmol/L (ref 20–29)
Calcium: 10 mg/dL (ref 8.7–10.3)
Creatinine, Ser: 0.72 mg/dL (ref 0.57–1.00)
GFR calc Af Amer: 102 mL/min/{1.73_m2} (ref >59)
GFR calc non Af Amer: 88 mL/min/{1.73_m2} (ref >59)
GLOBULIN, TOTAL: 2.8 g/dL (ref 1.5–4.5)
Glucose: 87 mg/dL (ref 65–99)
POTASSIUM: 4.4 mmol/L (ref 3.5–5.2)
SODIUM: 144 mmol/L (ref 134–144)
Total Protein: 7.6 g/dL (ref 6.0–8.5)

## 2017-06-30 LAB — LAMOTRIGINE LEVEL: Lamotrigine Lvl: 8.8 ug/mL (ref 2.0–20.0)

## 2017-09-07 ENCOUNTER — Other Ambulatory Visit: Payer: Self-pay | Admitting: Adult Health

## 2017-09-07 NOTE — Telephone Encounter (Signed)
Lamictal refilled x 1 year.

## 2018-05-05 ENCOUNTER — Ambulatory Visit (INDEPENDENT_AMBULATORY_CARE_PROVIDER_SITE_OTHER): Payer: BLUE CROSS/BLUE SHIELD

## 2018-05-05 ENCOUNTER — Encounter: Payer: Self-pay | Admitting: Podiatry

## 2018-05-05 ENCOUNTER — Ambulatory Visit (INDEPENDENT_AMBULATORY_CARE_PROVIDER_SITE_OTHER): Payer: BLUE CROSS/BLUE SHIELD | Admitting: Podiatry

## 2018-05-05 VITALS — BP 114/62 | HR 72 | Resp 16

## 2018-05-05 DIAGNOSIS — M2012 Hallux valgus (acquired), left foot: Secondary | ICD-10-CM

## 2018-05-05 DIAGNOSIS — M2011 Hallux valgus (acquired), right foot: Secondary | ICD-10-CM

## 2018-05-05 DIAGNOSIS — M2041 Other hammer toe(s) (acquired), right foot: Secondary | ICD-10-CM

## 2018-05-05 NOTE — Patient Instructions (Addendum)
Bunion A bunion is a bump on the base of the big toe that forms when the bones of the big toe joint move out of position. Bunions may be small at first, but they often get larger over time. The can make walking painful. What are the causes? A bunion may be caused by:  Wearing narrow or pointed shoes that force the big toe to press against the other toes.  Abnormal foot development that causes the foot to roll inward (pronate).  Changes in the foot that are caused by certain diseases, such as rheumatoid arthritis and polio.  A foot injury.  What increases the risk? The following factors may make you more likely to develop this condition:  Wearing shoes that squeeze the toes together.  Having certain diseases, such as: ? Rheumatoid arthritis. ? Polio. ? Cerebral palsy.  Having family members who have bunions.  Being born with a foot deformity, such as flat feet or low arches.  Doing activities that put a lot of pressure on the feet, such as ballet dancing.  What are the signs or symptoms? The main symptom of a bunion is a noticeable bump on the big toe. Other symptoms may include:  Pain.  Swelling around the big toe.  Redness and inflammation.  Thick or hardened skin on the big toe or between the toes.  Stiffness or loss of motion in the big toe.  Trouble with walking.  How is this diagnosed? A bunion may be diagnosed based on your symptoms, medical history, and activities. You may have tests, such as:  X-rays. These allow your health care provider to check the position of the bones in your foot and look for damage to your joint. They also help your health care provider to determine the severity of your bunion and the best way to treat it.  Joint aspiration. In this test, a sample of fluid is removed from the toe joint. This test, which may be done if you are in a lot of pain, helps to rule out diseases that cause painful swelling of the joints, such as  arthritis.  How is this treated? There is no cure for a bunion, but treatment can help to prevent a bunion from getting worse. Treatment depends on the severity of your symptoms. Your health care provider may recommend:  Wearing shoes that have a wide toe box.  Using bunion pads to cushion the affected area.  Taping your toes together to keep them in a normal position.  Placing a device inside your shoe (orthotics) to help reduce pressure on your toe joint.  Taking medicine to ease pain, inflammation, and swelling.  Applying heat or ice to the affected area.  Doing stretching exercises.  Surgery to remove scar tissue and move the toes back into their normal position. This treatment is rare.  Follow these instructions at home:  Support your toe joint with proper footwear, shoe padding, or taping as told by your health care provider.  Take over-the-counter and prescription medicines only as told by your health care provider.  If directed, apply ice to the injured area: ? Put ice in a plastic bag. ? Place a towel between your skin and the bag. ? Leave the ice on for 20 minutes, 2-3 times per day.  If directed, apply heat to the affected area before you exercise. Use the heat source that your health care provider recommends, such as a moist heat pack or a heating pad. ? Place a towel between your   skin and the heat source. ? Leave the heat on for 20-30 minutes. ? Remove the heat if your skin turns bright red. This is especially important if you are unable to feel pain, heat, or cold. You may have a greater risk of getting burned.  Do exercises as told by your health care provider.  Keep all follow-up visits as told by your health care provider. Contact a health care provider if:  Your symptoms get worse.  Your symptoms do not improve in 2 weeks. Get help right away if:  You have severe pain and trouble with walking. This information is not intended to replace advice given  to you by your health care provider. Make sure you discuss any questions you have with your health care provider. Document Released: 05/31/2005 Document Revised: 11/06/2015 Document Reviewed: 12/29/2014 Elsevier Interactive Patient Education  2018 Elsevier Inc.   Hammer Toe Hammer toe is a change in the shape (a deformity) of your second, third, or fourth toe. The deformity causes the middle joint of your toe to stay bent. This causes pain, especially when you are wearing shoes. Hammer toe starts gradually. At first, the toe can be straightened. Gradually over time, the deformity becomes stiff and permanent. Early treatments to keep the toe straight may relieve pain. As the deformity becomes stiff and permanent, surgery may be needed to straighten the toe. What are the causes? Hammer toe is caused by abnormal bending of the toe joint that is closest to your foot. It happens gradually over time. This pulls on the muscles and connections (tendons) of the toe joint, making them weak and stiff. It is often related to wearing shoes that are too short or narrow and do not let your toes straighten. What increases the risk? You may be at greater risk for hammer toe if you:  Are female.  Are older.  Wear shoes that are too small.  Wear high-heeled shoes that pinch your toes.  Are a ballet dancer.  Have a second toe that is longer than your big toe (first toe).  Injure your foot or toe.  Have arthritis.  Have a family history of hammer toe.  Have a nerve or muscle disorder.  What are the signs or symptoms? The main symptoms of this condition are pain and deformity of the toe. The pain is worse when wearing shoes, walking, or running. Other symptoms may include:  Corns or calluses over the bent part of the toe or between the toes.  Redness and a burning feeling on the toe.  An open sore that forms on the top of the toe.  Not being able to straighten the toe.  How is this  diagnosed? This condition is diagnosed based on your symptoms and a physical exam. During the exam, your health care provider will try to straighten your toe to see how stiff the deformity is. You may also have tests, such as:  A blood test to check for rheumatoid arthritis.  An X-ray to show how severe the deformity is.  How is this treated? Treatment for this condition will depend on how stiff the deformity is. Surgery is often needed. However, sometimes a hammer toe can be straightened without surgery. Treatments that do not involve surgery include:  Taping the toe into a straightened position.  Using pads and cushions to protect the toe (orthotics).  Wearing shoes that provide enough room for the toes.  Doing toe-stretching exercises at home.  Taking an NSAID to reduce pain and   swelling.  If these treatments do not help or the toe cannot be straightened, surgery is the next option. The most common surgeries used to straighten a hammer toe include:  Arthroplasty. In this procedure, part of the joint is removed, and that allows the toe to straighten.  Fusion. In this procedure, cartilage between the two bones of the joint is taken out and the bones are fused together into one longer bone.  Implantation. In this procedure, part of the bone is removed and replaced with an implant to let the toe move again.  Flexor tendon transfer. In this procedure, the tendons that curl the toes down (flexor tendons) are repositioned.  Follow these instructions at home:  Take over-the-counter and prescription medicines only as told by your health care provider.  Do toe straightening and stretching exercises as told by your health care provider.  Keep all follow-up visits as told by your health care provider. This is important. How is this prevented?  Wear shoes that give your toes enough room and do not cause pain.  Do not wear high-heeled shoes. Contact a health care provider if:  Your  pain gets worse.  Your toe becomes red or swollen.  You develop an open sore on your toe. This information is not intended to replace advice given to you by your health care provider. Make sure you discuss any questions you have with your health care provider. Document Released: 05/28/2000 Document Revised: 12/19/2015 Document Reviewed: 09/24/2015 Elsevier Interactive Patient Education  2018 Elsevier Inc.   

## 2018-05-05 NOTE — Progress Notes (Signed)
   Subjective:    Patient ID: Katie Mcneil, female    DOB: 1951/05/17, 67 y.o.   MRN: [id number]  HPI    Review of Systems  All other systems reviewed and are negative.      Objective:   Physical Exam        Assessment & Plan:

## 2018-05-07 NOTE — Progress Notes (Signed)
Subjective:   Patient ID: Katie Mcneil, female   DOB: 67 y.o.   MRN: [id number]   HPI Patient presents stating that she had bunion surgery done 30 years ago right and then the left one done 10 years ago by [place] on the left foot.  States the right one is giving her trouble the second toes giving her trouble and she knows that she needs surgery and she wants [place] to be able to take care of this.  Patient does not smoke and likes to be active   Review of Systems  All other systems reviewed and are negative.       Objective:  Physical Exam  Neurovascular status intact muscle strength was adequate range of motion within normal limits with patient noted to have structural deformity of the right first metatarsal with redness around the joint and prominence with deviation hallux against second toe with elevation of the second toe.  The left shows mild deformity but nowhere near to the same degree and there has been previous corrections on both feet.  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  This is difficult because of revisional work but I do think distal osteotomy will give her a good clinical result even though we will not be able to get full radiographic result and also digital fusion with probable Akin osteotomy.  I reviewed procedure correction and explained all this to her and she is scheduled January and will be seen back to go over in greater detail  X-ray indicates that the osteotomy from previous is done well left and there is under correction right with elevation of the intermetatarsal angle and deviation hallux  second toe with rigid elevation second digit right

## 2018-05-26 ENCOUNTER — Ambulatory Visit: Payer: BLUE CROSS/BLUE SHIELD | Admitting: Podiatry

## 2018-06-16 ENCOUNTER — Telehealth: Payer: Self-pay | Admitting: Podiatry

## 2018-06-16 NOTE — Telephone Encounter (Signed)
Called pt and left a voicemail to see if she wanted to reschedule her appointment so she can come in for a consult and sign consent forms for the surgery she is scheduled for on 14 January.

## 2018-06-19 NOTE — Telephone Encounter (Addendum)
I attempted to call the patient regarding scheduling a consultation with Dr. Paulla Dolly.  We have her scheduled for surgery on June 27, 2018.  I left her a message to call me back.

## 2018-06-19 NOTE — Telephone Encounter (Addendum)
I attempted to call the patient again.  I left a message on her home voicemail.  Her work number would not allow me to leave a message.

## 2018-06-21 NOTE — Telephone Encounter (Signed)
I attempted to call the patient again.  I left her a message to call me back tomorrow.

## 2018-06-22 NOTE — Telephone Encounter (Signed)
"  I am returning your call."  I was calling you in regards to your surgery that's scheduled for next week.  I had left you a message that I want to cancel it.  My mother fell and broke her hip, so I am having to help take care of her."  I will cancel your surgery and let Dr. Paulla Dolly know.  I hope everything goes well with your mother.

## 2018-06-29 ENCOUNTER — Ambulatory Visit: Payer: BLUE CROSS/BLUE SHIELD | Admitting: Adult Health

## 2018-09-26 ENCOUNTER — Telehealth: Payer: Self-pay

## 2018-09-26 NOTE — Telephone Encounter (Signed)
I contacted the pt and left a vm in regards to her 4/28 appt.  Trying to verify if pt would be interesting in moving her appt to a virtual video visit with Dr. Jannifer Franklin. MM will still be on maternity leave on 4/28.

## 2018-09-27 ENCOUNTER — Telehealth: Payer: Self-pay | Admitting: Neurology

## 2018-09-27 NOTE — Telephone Encounter (Signed)
This is a Willis/Megan NP patient who needs to be rescheduled due to MM being out. Patient can be called and converted to video visit with Judson Roch NP.

## 2018-09-27 NOTE — Telephone Encounter (Signed)
Unable to get in contact with the patient to convert their office visit into a webex or telephone visit. I left them a voicemail asking to return my call. Office number was provided.   

## 2018-09-29 ENCOUNTER — Other Ambulatory Visit: Payer: Self-pay | Admitting: Neurology

## 2018-10-10 NOTE — Telephone Encounter (Signed)
Attempted to reach the pt in regards to 10/11/18 appt. Pts vm was full and I could not leave a vm.

## 2018-10-11 ENCOUNTER — Encounter: Payer: Self-pay | Admitting: Adult Health

## 2018-10-11 ENCOUNTER — Ambulatory Visit: Payer: BLUE CROSS/BLUE SHIELD | Admitting: Adult Health

## 2018-10-11 NOTE — Telephone Encounter (Signed)
Attempted to contact patient today due to her being on Megan's schedule, as Jinny Blossom is out of office. Patient's voicemail was full so I could not leave a message about rescheduling. Patient's appointment was cancelled and I sent her a letter to make her aware.

## 2018-10-26 LAB — HM PAP SMEAR

## 2018-10-26 LAB — HM DEXA SCAN

## 2018-12-14 ENCOUNTER — Telehealth: Payer: Self-pay | Admitting: *Deleted

## 2018-12-14 ENCOUNTER — Ambulatory Visit (INDEPENDENT_AMBULATORY_CARE_PROVIDER_SITE_OTHER): Payer: BC Managed Care – PPO | Admitting: Adult Health

## 2018-12-14 ENCOUNTER — Other Ambulatory Visit: Payer: Self-pay

## 2018-12-14 ENCOUNTER — Encounter: Payer: Self-pay | Admitting: Adult Health

## 2018-12-14 VITALS — BP 110/65 | HR 69 | Temp 97.8°F | Ht 66.0 in | Wt 167.0 lb

## 2018-12-14 DIAGNOSIS — G40309 Generalized idiopathic epilepsy and epileptic syndromes, not intractable, without status epilepticus: Secondary | ICD-10-CM

## 2018-12-14 DIAGNOSIS — Z5181 Encounter for therapeutic drug level monitoring: Secondary | ICD-10-CM

## 2018-12-14 MED ORDER — LAMOTRIGINE 150 MG PO TABS: 150.0000 mg | ORAL_TABLET | Freq: Two times a day (BID) | ORAL | 3 refills | Status: DC

## 2018-12-14 MED ORDER — LAMOTRIGINE ER 300 MG PO TB24: 300.0000 mg | ORAL_TABLET | Freq: Every day | ORAL | 11 refills | Status: DC

## 2018-12-14 NOTE — Telephone Encounter (Signed)
Called [facility] [phone] spoke to TODD.  I relayed to cancel the Lamictal IR and use lamictal ER prescription.  He stated understanding.  I relayed pt aware.

## 2018-12-14 NOTE — Patient Instructions (Signed)
Continue Lamictal °Blood work today °If you have any seizure events please let us know.  ° ° °

## 2018-12-14 NOTE — Progress Notes (Signed)
I have read the note, and I agree with the clinical assessment and plan.  Katie Mcneil   

## 2018-12-14 NOTE — Progress Notes (Signed)
PATIENT: Katie Mcneil DOB: 1950/06/30  REASON FOR VISIT: follow up HISTORY FROM: patient  HISTORY OF PRESENT ILLNESS: Today 12/14/18:  Katie Mcneil is a 68 year old female with a history of seizures.  She returns today for follow-up.  She remains on Lamictal 150 mg twice a day.  She is able to complete all ADLs independently.  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  She states that when she takes the full dose of Lamictal at bedtime she feels drowsy the next morning.  She states that there is been times that she only took half a dose and she felt better the next morning.  She has not had any seizure events.  She reports that she has headaches but she feels that this is sinus related.  Reports that she has nasal congestion daily.  She returns today for follow-up  HISTORY Katie Mcneil is a 68 year old right-handed black female with a history of seizures that have been under good control with Lamictal taking 150 mg twice daily.  The patient is working, she is operating a Teacher, music without difficulty.  She reports no significant problems with tolerating the Lamictal.  She recent was diagnosed with dyslipidemia, a statin drug was recommended but she has not yet started this.  The patient has not been seen in almost 2 years, but she indicates that she has not had any seizures during that period of time.  She returns for an evaluation.  REVIEW OF SYSTEMS: Out of a complete 14 system review of symptoms, the patient complains only of the following symptoms, and all other reviewed systems are negative.  See HPI  ALLERGIES: No Known Allergies  HOME MEDICATIONS: Outpatient Medications Prior to Visit  Medication Sig Dispense Refill  . lamoTRIgine (LAMICTAL) 150 MG tablet TAKE 1 TABLET BY MOUTH TWICE A DAY 180 tablet 0   No facility-administered medications prior to visit.     PAST MEDICAL HISTORY: Past Medical History:  Diagnosis Date  .  Dyslipidemia   . Seizures ([place])     PAST SURGICAL HISTORY: Past Surgical History:  Procedure Laterality Date  . NO PAST SURGERIES      FAMILY HISTORY: Family History  Problem Relation Age of Onset  . Seizures Daughter   . Other Daughter        Psychological disorder    SOCIAL HISTORY: Social History   Socioeconomic History  . Marital status: Widowed    Spouse name: Not on file  . Number of children: 2  . Years of education: Not on file  . Highest education level: Not on file  Occupational History  . Not on file  Social Needs  . Financial resource strain: Not on file  . Food insecurity    Worry: Not on file    Inability: Not on file  . Transportation needs    Medical: Not on file    Non-medical: Not on file  Tobacco Use  . Smoking status: Never Smoker  . Smokeless tobacco: Never Used  Substance and Sexual Activity  . Alcohol use: Yes    Alcohol/week: 0.0 standard drinks    Comment: occassion  . Drug use: No  . Sexual activity: Not on file  Lifestyle  . Physical activity    Days per week: Not on file    Minutes per session: Not on file  . Stress: Not on file  Relationships  . Social connections    Talks on phone: Not on file  Gets together: Not on file    Attends religious service: Not on file    Active member of club or organization: Not on file    Attends meetings of clubs or organizations: Not on file    Relationship status: Not on file  . Intimate partner violence    Fear of current or ex partner: Not on file    Emotionally abused: Not on file    Physically abused: Not on file    Forced sexual activity: Not on file  Other Topics Concern  . Not on file  Social History Narrative   Patient is widowed and lives alone.   Patient is right-handed.   Patient has two children.   Patient is working full-time.      PHYSICAL EXAM  Vitals:   12/14/18 0936  BP: 110/65  Temp: 97.8 F (36.6 C)  Weight: 167 lb (75.8 kg)  Height: 5\' 6"  (1.676 m)    Body mass index is 26.95 kg/m.  Generalized: Well developed, in no acute distress   Neurological examination  Mentation: Alert oriented to time, place, history taking. Follows all commands speech and language fluent Cranial nerve II-XII: Pupils were equal round reactive to light. Extraocular movements were full, visual field were full on confrontational test. Facial sensation and strength were normal. Uvula tongue midline. Head turning and shoulder shrug  were normal and symmetric. Motor: The motor testing reveals 5 over 5 strength of all 4 extremities. Good symmetric motor tone is noted throughout.  Sensory: Sensory testing is intact to soft touch on all 4 extremities. No evidence of extinction is noted.  Coordination: Cerebellar testing reveals good finger-nose-finger and heel-to-shin bilaterally.  Gait and station: Gait is normal. Tandem gait is normal. Romberg is negative. No drift is seen.  Reflexes: Deep tendon reflexes are symmetric and normal bilaterally.   DIAGNOSTIC DATA (LABS, IMAGING, TESTING) - I reviewed patient records, labs, notes, testing and imaging myself where available.  Lab Results  Component Value Date   WBC 5.3 06/29/2017   HGB 11.8 06/29/2017   HCT 38.1 06/29/2017   MCV 89 06/29/2017   PLT 340 06/29/2017      Component Value Date/Time   NA 144 06/29/2017 0906   K 4.4 06/29/2017 0906   CL 104 06/29/2017 0906   CO2 25 06/29/2017 0906   GLUCOSE 87 06/29/2017 0906   BUN 9 06/29/2017 0906   CREATININE 0.72 06/29/2017 0906   CALCIUM 10.0 06/29/2017 0906   PROT 7.6 06/29/2017 0906   ALBUMIN 4.8 06/29/2017 0906   AST 21 06/29/2017 0906   ALT 19 06/29/2017 0906   ALKPHOS 97 06/29/2017 0906   BILITOT 0.3 06/29/2017 0906   GFRNONAA 88 06/29/2017 0906   GFRAA 102 06/29/2017 0906   No results found for: CHOL, HDL, LDLCALC, LDLDIRECT, TRIG, CHOLHDL No results found for: HGBA1C Lab Results  Component Value Date   VITAMINB12 1,345 (H) 11/01/2013   Lab  Results  Component Value Date   TSH 1.440 11/01/2013      ASSESSMENT AND PLAN 68 y.o. year old female  has a past medical history of Dyslipidemia and Seizures ([place]). here with:  1.  Seizures  The patient will be switched from Lamictal IR to Lamictal extended release 300 mg daily.  Hopefully this will improve her morning drowsiness.  I have advised patient if she does not notice any improvement she should let us know.  I have reviewed seizure precautions with the patient.  Advised that if her symptoms worsen  or she develops new symptoms she should let us know.  She will follow-up in 6 months or sooner if needed.   I spent 15 minutes with the patient. 50% of this time was spent reviewing medication and seizure precautions   Ward Givens, MSN, NP-C 12/14/2018, 9:42 AM [facility] [address] [place], [place] [address] [phone]

## 2018-12-18 LAB — CBC WITH DIFFERENTIAL/PLATELET
Basophils Absolute: 0 10*3/uL (ref 0.0–0.2)
Basos: 1 %
EOS (ABSOLUTE): 0.3 10*3/uL (ref 0.0–0.4)
Eos: 5 %
Hematocrit: 35.7 % (ref 34.0–46.6)
Hemoglobin: 11.3 g/dL (ref 11.1–15.9)
Immature Grans (Abs): 0 10*3/uL (ref 0.0–0.1)
Immature Granulocytes: 0 %
Lymphocytes Absolute: 1.3 10*3/uL (ref 0.7–3.1)
Lymphs: 27 %
MCH: 27.6 pg (ref 26.6–33.0)
MCHC: 31.7 g/dL (ref 31.5–35.7)
MCV: 87 fL (ref 79–97)
Monocytes Absolute: 0.6 10*3/uL (ref 0.1–0.9)
Monocytes: 12 %
Neutrophils Absolute: 2.7 10*3/uL (ref 1.4–7.0)
Neutrophils: 55 %
Platelets: 289 10*3/uL (ref 150–450)
RBC: 4.09 x10E6/uL (ref 3.77–5.28)
RDW: 13.6 % (ref 11.7–15.4)
WBC: 4.9 10*3/uL (ref 3.4–10.8)

## 2018-12-18 LAB — COMPREHENSIVE METABOLIC PANEL
ALT: 23 IU/L (ref 0–32)
AST: 24 IU/L (ref 0–40)
Albumin/Globulin Ratio: 1.9 (ref 1.2–2.2)
Albumin: 4.6 g/dL (ref 3.8–4.8)
Alkaline Phosphatase: 95 IU/L (ref 39–117)
BUN/Creatinine Ratio: 12 (ref 12–28)
BUN: 9 mg/dL (ref 8–27)
Bilirubin Total: 0.3 mg/dL (ref 0.0–1.2)
CO2: 25 mmol/L (ref 20–29)
Calcium: 10.2 mg/dL (ref 8.7–10.3)
Chloride: 103 mmol/L (ref 96–106)
Creatinine, Ser: 0.77 mg/dL (ref 0.57–1.00)
GFR calc Af Amer: 92 mL/min/{1.73_m2} (ref >59)
GFR calc non Af Amer: 80 mL/min/{1.73_m2} (ref >59)
Globulin, Total: 2.4 g/dL (ref 1.5–4.5)
Glucose: 83 mg/dL (ref 65–99)
Potassium: 4.7 mmol/L (ref 3.5–5.2)
Sodium: 146 mmol/L — ABNORMAL HIGH (ref 134–144)
Total Protein: 7 g/dL (ref 6.0–8.5)

## 2018-12-18 LAB — LAMOTRIGINE LEVEL: Lamotrigine Lvl: 9.2 ug/mL (ref 2.0–20.0)

## 2018-12-18 NOTE — Telephone Encounter (Signed)
-----   Message from Ward Givens, NP sent at 12/18/2018  1:02 PM EDT ----- Labs results are unremarkable. Please call patient with results.

## 2019-01-02 NOTE — Telephone Encounter (Signed)
Mailed copy of mychart message to pt.

## 2019-07-19 DIAGNOSIS — Z20822 Contact with and (suspected) exposure to covid-19: Secondary | ICD-10-CM | POA: Diagnosis not present

## 2019-07-28 DIAGNOSIS — Z23 Encounter for immunization: Secondary | ICD-10-CM | POA: Diagnosis not present

## 2019-08-24 DIAGNOSIS — Z23 Encounter for immunization: Secondary | ICD-10-CM | POA: Diagnosis not present

## 2019-11-06 ENCOUNTER — Encounter: Payer: Self-pay | Admitting: Adult Health

## 2019-12-12 ENCOUNTER — Other Ambulatory Visit: Payer: Self-pay | Admitting: Adult Health

## 2019-12-12 DIAGNOSIS — L720 Epidermal cyst: Secondary | ICD-10-CM | POA: Diagnosis not present

## 2019-12-24 ENCOUNTER — Telehealth: Payer: Self-pay

## 2019-12-24 NOTE — Telephone Encounter (Signed)
Pt left a VM asking for a call to schedule an appt. Please call.

## 2019-12-25 ENCOUNTER — Telehealth: Payer: BC Managed Care – PPO | Admitting: Adult Health

## 2019-12-25 NOTE — Telephone Encounter (Signed)
Attempted to call the patient w/o success. LM on the VM for the patient to call back and schedule a f/u appt.

## 2019-12-25 NOTE — Telephone Encounter (Signed)
Attempted to call pt, LVM to call back to schedule appt

## 2020-01-12 ENCOUNTER — Other Ambulatory Visit: Payer: Self-pay | Admitting: Adult Health

## 2020-01-15 ENCOUNTER — Encounter: Payer: Self-pay | Admitting: Adult Health

## 2020-01-15 ENCOUNTER — Ambulatory Visit (INDEPENDENT_AMBULATORY_CARE_PROVIDER_SITE_OTHER): Payer: BC Managed Care – PPO | Admitting: Adult Health

## 2020-01-15 VITALS — BP 130/71 | HR 61 | Ht 66.0 in | Wt 167.0 lb

## 2020-01-15 DIAGNOSIS — G40309 Generalized idiopathic epilepsy and epileptic syndromes, not intractable, without status epilepticus: Secondary | ICD-10-CM | POA: Diagnosis not present

## 2020-01-15 MED ORDER — LAMOTRIGINE ER 300 MG PO TB24: 1.0000 | ORAL_TABLET | Freq: Every day | ORAL | 3 refills | Status: DC

## 2020-01-15 NOTE — Progress Notes (Signed)
I have read the note, and I agree with the clinical assessment and plan.  Jett Fukuda K Kerington Hildebrant   

## 2020-01-15 NOTE — Progress Notes (Signed)
PATIENT: Katie Mcneil DOB: 10-02-1950  REASON FOR VISIT: follow up HISTORY FROM: patient  HISTORY OF PRESENT ILLNESS: Today 01/15/20:  Katie Mcneil Is a 69 year old female with a history of seizures.  She returns today for follow-up.  She remains on Lamictal extended release 300 mg daily.  She denies any seizure events.  Denies any changes with her gait or balance.  She does report that she is under a lot of stress right now with her daughter who is suffering from mental illness.  She also feels that she may have ADD but this has not been formally diagnosed.  HISTORY 12/14/18:  Katie Mcneil is a 69 year old female with a history of seizures.  She returns today for follow-up.  She remains on Lamictal 150 mg twice a day.  She is able to complete all ADLs independently.  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  She states that when she takes the full dose of Lamictal at bedtime she feels drowsy the next morning.  She states that there is been times that she only took half a dose and she felt better the next morning.  She has not had any seizure events.  She reports that she has headaches but she feels that this is sinus related.  Reports that she has nasal congestion daily.  She returns today for follow-up  REVIEW OF SYSTEMS: Out of a complete 14 system review of symptoms, the patient complains only of the following symptoms, and all other reviewed systems are negative.  See HPI ALLERGIES: No Known Allergies  HOME MEDICATIONS: Outpatient Medications Prior to Visit  Medication Sig Dispense Refill  . LamoTRIgine 300 MG TB24 24 hour tablet TAKE 1 TABLET BY MOUTH EVERY DAY 30 tablet 2   No facility-administered medications prior to visit.    PAST MEDICAL HISTORY: Past Medical History:  Diagnosis Date  . Dyslipidemia   . Seizures ([place])     PAST SURGICAL HISTORY: Past Surgical History:  Procedure Laterality Date  . NO PAST SURGERIES       FAMILY HISTORY: Family History  Problem Relation Age of Onset  . Seizures Daughter   . Other Daughter        Psychological disorder    SOCIAL HISTORY: Social History   Socioeconomic History  . Marital status: Widowed    Spouse name: Not on file  . Number of children: 2  . Years of education: Not on file  . Highest education level: Not on file  Occupational History  . Not on file  Tobacco Use  . Smoking status: Never Smoker  . Smokeless tobacco: Never Used  Substance and Sexual Activity  . Alcohol use: Yes    Alcohol/week: 0.0 standard drinks    Comment: occassion  . Drug use: No  . Sexual activity: Not on file  Other Topics Concern  . Not on file  Social History Narrative   Patient is widowed and lives alone.   Patient is right-handed.   Patient has two children.   Patient is working full-time.   Social Determinants of Health   Financial Resource Strain:   . Difficulty of Paying Living Expenses:   Food Insecurity:   . Worried About Charity fundraiser in the Last Year:   . Arboriculturist in the Last Year:   Transportation Needs:   . Film/video editor (Medical):   [name] Lack of Transportation (Non-Medical):   Physical Activity:   . Days  of Exercise per Week:   . Minutes of Exercise per Session:   Stress:   . Feeling of Stress :   Social Connections:   . Frequency of Communication with Friends and Family:   . Frequency of Social Gatherings with Friends and Family:   . Attends Religious Services:   . Active Member of Clubs or Organizations:   . Attends Archivist Meetings:   [name] Marital Status:   Intimate Partner Violence:   . Fear of Current or Ex-Partner:   . Emotionally Abused:   [name] Physically Abused:   . Sexually Abused:       PHYSICAL EXAM  Vitals:   01/15/20 1251  BP: 130/71  Pulse: 61  Weight: 167 lb (75.8 kg)  Height: 5\' 6"  (1.676 m)   Body mass index is 26.95 kg/m.  Generalized: Well developed, in no acute distress    Neurological examination  Mentation: Alert oriented to time, place, history taking. Follows all commands speech and language fluent Cranial nerve II-XII: Pupils were equal round reactive to light. Extraocular movements were full, visual field were full on confrontational test. . Head turning and shoulder shrug  were normal and symmetric. Motor: The motor testing reveals 5 over 5 strength of all 4 extremities. Good symmetric motor tone is noted throughout.  Sensory: Sensory testing is intact to soft touch on all 4 extremities. No evidence of extinction is noted.  Coordination: Cerebellar testing reveals good finger-nose-finger and heel-to-shin bilaterally.  Gait and station: Gait is normal.  Reflexes: Deep tendon reflexes are symmetric and normal bilaterally.   DIAGNOSTIC DATA (LABS, IMAGING, TESTING) - I reviewed patient records, labs, notes, testing and imaging myself where available.  Lab Results  Component Value Date   WBC 4.9 12/14/2018   HGB 11.3 12/14/2018   HCT 35.7 12/14/2018   MCV 87 12/14/2018   PLT 289 12/14/2018      Component Value Date/Time   NA 146 (H) 12/14/2018 1017   K 4.7 12/14/2018 1017   CL 103 12/14/2018 1017   CO2 25 12/14/2018 1017   GLUCOSE 83 12/14/2018 1017   BUN 9 12/14/2018 1017   CREATININE 0.77 12/14/2018 1017   CALCIUM 10.2 12/14/2018 1017   PROT 7.0 12/14/2018 1017   ALBUMIN 4.6 12/14/2018 1017   AST 24 12/14/2018 1017   ALT 23 12/14/2018 1017   ALKPHOS 95 12/14/2018 1017   BILITOT 0.3 12/14/2018 1017   GFRNONAA 80 12/14/2018 1017   GFRAA 92 12/14/2018 1017    Lab Results  Component Value Date   VITAMINB12 1,345 (H) 11/01/2013   Lab Results  Component Value Date   TSH 1.440 11/01/2013      ASSESSMENT AND PLAN 69 y.o. year old female  has a past medical history of Dyslipidemia and Seizures ([place]). here with:  1.  Seizures   Continue Lamictal extended release 300 mg daily  Advised if she has any seizure event she should let  us know  Follow-up in 1 year or sooner if needed   I spent 20 minutes of face-to-face and non-face-to-face time with patient.  This included previsit chart review, lab review, study review, order entry, electronic health record documentation, patient education.  Ward Givens, MSN, NP-C 01/15/2020, 1:07 PM [facility] [address], [place], [place] [address] [phone]

## 2020-01-15 NOTE — Patient Instructions (Signed)
Your Plan:  Continue Lamictal ER  If your symptoms worsen or you develop new symptoms please let us know.   Thank you for coming to see [place] at [facility]. I hope we have been able to provide you high quality care today.  You may receive a patient satisfaction survey over the next few weeks. We would appreciate your feedback and comments so that we may continue to improve ourselves and the health of our patients.

## 2020-02-13 DIAGNOSIS — Z1231 Encounter for screening mammogram for malignant neoplasm of breast: Secondary | ICD-10-CM | POA: Diagnosis not present

## 2020-02-13 DIAGNOSIS — Z6827 Body mass index (BMI) 27.0-27.9, adult: Secondary | ICD-10-CM | POA: Diagnosis not present

## 2020-02-13 DIAGNOSIS — Z01419 Encounter for gynecological examination (general) (routine) without abnormal findings: Secondary | ICD-10-CM | POA: Diagnosis not present

## 2020-02-13 LAB — HM MAMMOGRAPHY

## 2020-07-17 ENCOUNTER — Telehealth: Payer: Self-pay | Admitting: Adult Health

## 2020-07-17 NOTE — Telephone Encounter (Signed)
Pt. states she is off balance & it takes forever for her to think of a common word. She is wanting to be scheduled for an appt. Please advise.

## 2020-07-17 NOTE — Telephone Encounter (Signed)
I called and spoke to pt.  She states having more issues with memory.  Has family hx of dementia.  She has initially had memory deficits as diagnosis.  No recent MMSE noted.  Have seen mostly for Sz.  She spoke of being off balance (this has been some thing she has had previously.ibuprofen will look for earlier appt. She may even see pcp, for checkup.

## 2020-09-10 ENCOUNTER — Other Ambulatory Visit: Payer: Self-pay | Admitting: Physician Assistant

## 2020-09-10 ENCOUNTER — Ambulatory Visit (INDEPENDENT_AMBULATORY_CARE_PROVIDER_SITE_OTHER): Payer: BC Managed Care – PPO | Admitting: Physician Assistant

## 2020-09-10 ENCOUNTER — Encounter: Payer: Self-pay | Admitting: Physician Assistant

## 2020-09-10 ENCOUNTER — Other Ambulatory Visit: Payer: Self-pay

## 2020-09-10 VITALS — BP 120/74 | HR 78 | Temp 98.0°F | Ht 65.0 in | Wt 163.2 lb

## 2020-09-10 DIAGNOSIS — F4323 Adjustment disorder with mixed anxiety and depressed mood: Secondary | ICD-10-CM

## 2020-09-10 DIAGNOSIS — Z23 Encounter for immunization: Secondary | ICD-10-CM | POA: Diagnosis not present

## 2020-09-10 DIAGNOSIS — G40309 Generalized idiopathic epilepsy and epileptic syndromes, not intractable, without status epilepticus: Secondary | ICD-10-CM

## 2020-09-10 DIAGNOSIS — Z0001 Encounter for general adult medical examination with abnormal findings: Secondary | ICD-10-CM

## 2020-09-10 DIAGNOSIS — Z636 Dependent relative needing care at home: Secondary | ICD-10-CM | POA: Diagnosis not present

## 2020-09-10 DIAGNOSIS — E785 Hyperlipidemia, unspecified: Secondary | ICD-10-CM | POA: Diagnosis not present

## 2020-09-10 DIAGNOSIS — Z1211 Encounter for screening for malignant neoplasm of colon: Secondary | ICD-10-CM

## 2020-09-10 DIAGNOSIS — R413 Other amnesia: Secondary | ICD-10-CM | POA: Diagnosis not present

## 2020-09-10 DIAGNOSIS — E663 Overweight: Secondary | ICD-10-CM

## 2020-09-10 LAB — CBC WITH DIFFERENTIAL/PLATELET
Basophils Absolute: 0 10*3/uL (ref 0.0–0.1)
Basophils Relative: 0.8 % (ref 0.0–3.0)
Eosinophils Absolute: 0.2 10*3/uL (ref 0.0–0.7)
Eosinophils Relative: 3.8 % (ref 0.0–5.0)
HCT: 36.6 % (ref 36.0–46.0)
Hemoglobin: 11.8 g/dL — ABNORMAL LOW (ref 12.0–15.0)
Lymphocytes Relative: 37.1 % (ref 12.0–46.0)
Lymphs Abs: 2 10*3/uL (ref 0.7–4.0)
MCHC: 32.3 g/dL (ref 30.0–36.0)
MCV: 87.6 fl (ref 78.0–100.0)
Monocytes Absolute: 0.5 10*3/uL (ref 0.1–1.0)
Monocytes Relative: 9.2 % (ref 3.0–12.0)
Neutro Abs: 2.6 10*3/uL (ref 1.4–7.7)
Neutrophils Relative %: 49.1 % (ref 43.0–77.0)
Platelets: 244 10*3/uL (ref 150.0–400.0)
RBC: 4.18 Mil/uL (ref 3.87–5.11)
RDW: 13.8 % (ref 11.5–15.5)
WBC: 5.3 10*3/uL (ref 4.0–10.5)

## 2020-09-10 LAB — COMPREHENSIVE METABOLIC PANEL
ALT: 11 U/L (ref 0–35)
AST: 17 U/L (ref 0–37)
Albumin: 4.4 g/dL (ref 3.5–5.2)
Alkaline Phosphatase: 80 U/L (ref 39–117)
BUN: 13 mg/dL (ref 6–23)
CO2: 28 mEq/L (ref 19–32)
Calcium: 9.4 mg/dL (ref 8.4–10.5)
Chloride: 105 mEq/L (ref 96–112)
Creatinine, Ser: 0.69 mg/dL (ref 0.40–1.20)
GFR: 88.97 mL/min (ref >60.00)
Glucose, Bld: 88 mg/dL (ref 70–99)
Potassium: 4 mEq/L (ref 3.5–5.1)
Sodium: 142 mEq/L (ref 135–145)
Total Bilirubin: 0.7 mg/dL (ref 0.2–1.2)
Total Protein: 6.9 g/dL (ref 6.0–8.3)

## 2020-09-10 LAB — LIPID PANEL
Cholesterol: 275 mg/dL — ABNORMAL HIGH (ref 0–200)
HDL: 58.4 mg/dL (ref >39.00)
LDL Cholesterol: 202 mg/dL — ABNORMAL HIGH (ref 0–99)
NonHDL: 216.8
Total CHOL/HDL Ratio: 5
Triglycerides: 73 mg/dL (ref 0.0–149.0)
VLDL: 14.6 mg/dL (ref 0.0–40.0)

## 2020-09-10 LAB — VITAMIN B12: Vitamin B-12: 525 pg/mL (ref 211–911)

## 2020-09-10 LAB — TSH: TSH: 1.33 u[IU]/mL (ref 0.35–4.50)

## 2020-09-10 NOTE — Patient Instructions (Addendum)
It was great to see you!  Please work on getting more sleep and caring for yourself. Referrals placed today for: colonoscopy, neuro cognitive assessment and talk therapy  Please go to the lab for blood work.   Our office will call you with your results unless you have chosen to receive results via MyChart.  If your blood work is normal we will follow-up each year for physicals and as scheduled for chronic medical problems.  If anything is abnormal we will treat accordingly and get you in for a follow-up.  Take care,  [facility] Maintenance, Female Adopting a healthy lifestyle and getting preventive care are important in promoting health and wellness. Ask your health care provider about:  The right schedule for you to have regular tests and exams.  Things you can do on your own to prevent diseases and keep yourself healthy. What should I know about diet, weight, and exercise? Eat a healthy diet  Eat a diet that includes plenty of vegetables, fruits, low-fat dairy products, and lean protein.  Do not eat a lot of foods that are high in solid fats, added sugars, or sodium.   Maintain a healthy weight Body mass index (BMI) is used to identify weight problems. It estimates body fat based on height and weight. Your health care provider can help determine your BMI and help you achieve or maintain a healthy weight. Get regular exercise Get regular exercise. This is one of the most important things you can do for your health. Most adults should:  Exercise for at least 150 minutes each week. The exercise should increase your heart rate and make you sweat (moderate-intensity exercise).  Do strengthening exercises at least twice a week. This is in addition to the moderate-intensity exercise.  Spend less time sitting. Even light physical activity can be beneficial. Watch cholesterol and blood lipids Have your blood tested for lipids and cholesterol at 69 years of age, then have  this test every 5 years. Have your cholesterol levels checked more often if:  Your lipid or cholesterol levels are high.  You are older than 69 years of age.  You are at high risk for heart disease. What should I know about cancer screening? Depending on your health history and family history, you may need to have cancer screening at various ages. This may include screening for:  Breast cancer.  Cervical cancer.  Colorectal cancer.  Skin cancer.  Lung cancer. What should I know about heart disease, diabetes, and high blood pressure? Blood pressure and heart disease  High blood pressure causes heart disease and increases the risk of stroke. This is more likely to develop in people who have high blood pressure readings, are of African descent, or are overweight.  Have your blood pressure checked: ? Every 3-5 years if you are 8-71 years of age. ? Every year if you are 75 years old or older. Diabetes Have regular diabetes screenings. This checks your fasting blood sugar level. Have the screening done:  Once every three years after age 52 if you are at a normal weight and have a low risk for diabetes.  More often and at a younger age if you are overweight or have a high risk for diabetes. What should I know about preventing infection? Hepatitis B If you have a higher risk for hepatitis B, you should be screened for this virus. Talk with your health care provider to find out if you are at risk for hepatitis B infection.  Hepatitis C Testing is recommended for:  Everyone born from [birth year] through 1964.  Anyone with known risk factors for hepatitis C. Sexually transmitted infections (STIs)  Get screened for STIs, including gonorrhea and chlamydia, if: ? You are sexually active and are younger than 70 years of age. ? You are older than 69 years of age and your health care provider tells you that you are at risk for this type of infection. ? Your sexual activity has changed since  you were last screened, and you are at increased risk for chlamydia or gonorrhea. Ask your health care provider if you are at risk.  Ask your health care provider about whether you are at high risk for HIV. Your health care provider may recommend a prescription medicine to help prevent HIV infection. If you choose to take medicine to prevent HIV, you should first get tested for HIV. You should then be tested every 3 months for as long as you are taking the medicine. Pregnancy  If you are about to stop having your period (premenopausal) and you may become pregnant, seek counseling before you get pregnant.  Take 400 to 800 micrograms (mcg) of folic acid every day if you become pregnant.  Ask for birth control (contraception) if you want to prevent pregnancy. Osteoporosis and menopause Osteoporosis is a disease in which the bones lose minerals and strength with aging. This can result in bone fractures. If you are 42 years old or older, or if you are at risk for osteoporosis and fractures, ask your health care provider if you should:  Be screened for bone loss.  Take a calcium or vitamin D supplement to lower your risk of fractures.  Be given hormone replacement therapy (HRT) to treat symptoms of menopause. Follow these instructions at home: Lifestyle  Do not use any products that contain nicotine or tobacco, such as cigarettes, e-cigarettes, and chewing tobacco. If you need help quitting, ask your health care provider.  Do not use street drugs.  Do not share needles.  Ask your health care provider for help if you need support or information about quitting drugs. Alcohol use  Do not drink alcohol if: ? Your health care provider tells you not to drink. ? You are pregnant, may be pregnant, or are planning to become pregnant.  If you drink alcohol: ? Limit how much you use to 0-1 drink a day. ? Limit intake if you are breastfeeding.  Be aware of how much alcohol is in your drink. In  the [place] one drink equals one 12 oz bottle of beer (355 mL), one 5 oz glass of wine (148 mL), or one 1 oz glass of hard liquor (44 mL). General instructions  Schedule regular health, dental, and eye exams.  Stay current with your vaccines.  Tell your health care provider if: ? You often feel depressed. ? You have ever been abused or do not feel safe at home. Summary  Adopting a healthy lifestyle and getting preventive care are important in promoting health and wellness.  Follow your health care provider's instructions about healthy diet, exercising, and getting tested or screened for diseases.  Follow your health care provider's instructions on monitoring your cholesterol and blood pressure. This information is not intended to replace advice given to you by your health care provider. Make sure you discuss any questions you have with your health care provider. Document Revised: 05/24/2018 Document Reviewed: 05/24/2018 Elsevier Patient Education  2021 [employer].

## 2020-09-10 NOTE — Progress Notes (Signed)
Katie Mcneil is a 70 y.o. female here to establish care and annual physical.  I acted as a Education administrator for [employer], PA-C Anselmo Pickler, LPN   History of Present Illness:   No chief complaint on file.   Acute Concerns: Memory issues -- difficulty with word finding at times. Denies family members noticing that she has had changes to her memory or losing things that she normally does not. Caregiver burden and situational anxiety/depression -- she goes to see her mom every weekend in [place], [place]. Goes on Saturdays and returns Sundays ;4 hour drive each way. Tries to go each weekend. Her daughter has significant anxiety/depression and lives with her and this has been a significant strain on her. Denies SI/HI or need for meds.  Chronic Issues: HLD -- has never been on rx medications Seizures -- started in 2006. Has only had a couple of seizures. Has been on lamictal 300 mg daily and has had no seizures since.  Health Maintenance: Immunizations -- will give Prevnar 13 today. Colonoscopy -- overdue Mammogram -- UTD, will get report PAP -- UTD, will get report Bone Density -- due Diet -- tries to eat more fruits and veggies; recently bought nutri-bullet; mostly water Sleep habits -- doesn't feel like she gets enough sleep; midnight -- 645a Exercise -- none Weight -- Weight: 163 lb 4 oz (74 kg)  Mood -- overall ok Alcohol -- none Tobacco -- none  Depression screen [facility] 2/9 09/10/2020  Decreased Interest 0  Down, Depressed, Hopeless 0  PHQ - 2 Score 0    No flowsheet data found.   Other providers/specialists: Patient Care Team: [name], [place] as PCP - General (Physician Assistant)   Past Medical History:  Diagnosis Date  . Dyslipidemia   . Seizures ([place])      Social History   Tobacco Use  . Smoking status: Never Smoker  . Smokeless tobacco: Never Used  Vaping Use  . Vaping Use: Never used  Substance Use Topics  . Alcohol use: Not Currently    Alcohol/week:  0.0 standard drinks  . Drug use: No    Past Surgical History:  Procedure Laterality Date  . CATARACT EXTRACTION W/ INTRAOCULAR LENS IMPLANT Bilateral 2019  . [place]  . NO PAST SURGERIES    . TUBAL LIGATION      Family History  Problem Relation Age of Onset  . Anuerysm Father   . Neuropathy Mother   . Seizures Daughter   . Other Daughter        Psychological disorder  . Leukemia Sister   . Alzheimer's disease Sister 46  . Dementia Brother   . Heart attack Maternal Grandmother   . CVA Maternal Grandfather   . Dementia Paternal Grandmother   . Colon cancer Neg Hx     No Known Allergies   Current Medications:   Current Outpatient Medications:  .  LamoTRIgine 300 MG TB24 24 hour tablet, Take 1 tablet (300 mg total) by mouth daily., Disp: 90 tablet, Rfl: 3 .  Multiple Vitamins-Minerals (ZINC PO), Take 1 tablet by mouth daily in the afternoon., Disp: , Rfl:  .  Omega-3 Fatty Acids (FISH OIL) 1000 MG CAPS, Take 1 capsule by mouth daily in the afternoon., Disp: , Rfl:  .  Vitamin D, Cholecalciferol, 25 MCG (1000 UT) CAPS, Take 1 capsule by mouth daily in the afternoon., Disp: , Rfl:    Review of Systems:   Review of Systems  Constitutional: Negative for chills,  fever, malaise/fatigue and weight loss.  HENT: Negative for hearing loss, sinus pain and sore throat.   Respiratory: Negative for cough and hemoptysis.   Cardiovascular: Negative for chest pain, palpitations, leg swelling and PND.  Gastrointestinal: Negative for abdominal pain, constipation, diarrhea, heartburn, nausea and vomiting.  Genitourinary: Negative for dysuria, frequency and urgency.  Musculoskeletal: Negative for back pain, myalgias and neck pain.  Skin: Negative for itching and rash.  Neurological: Negative for dizziness, tingling, seizures and headaches.  Endo/Heme/Allergies: Negative for polydipsia.  Psychiatric/Behavioral: Negative for depression. The patient is not  nervous/anxious.     Vitals:   Vitals:   09/10/20 0905  BP: 120/74  Pulse: 78  Temp: 98 F (36.7 C)  TempSrc: Temporal  SpO2: 97%  Weight: 163 lb 4 oz (74 kg)  Height: 5\' 5"  (1.651 m)      Body mass index is 27.17 kg/m.  Physical Exam:   Physical Exam Vitals and nursing note reviewed.  Constitutional:      General: She is not in acute distress.    Appearance: Normal appearance. She is well-developed. She is not ill-appearing or toxic-appearing.  HENT:     Head: Normocephalic and atraumatic.     Right Ear: Tympanic membrane, ear canal and external ear normal. Tympanic membrane is not erythematous, retracted or bulging.     Left Ear: Tympanic membrane, ear canal and external ear normal. Tympanic membrane is not erythematous, retracted or bulging.  Eyes:     General: Lids are normal.     Conjunctiva/sclera: Conjunctivae normal.     Pupils: Pupils are equal, round, and reactive to light.  Neck:     Trachea: Trachea normal.  Cardiovascular:     Rate and Rhythm: Normal rate and regular rhythm.     Heart sounds: Normal heart sounds, S1 normal and S2 normal.  Pulmonary:     Effort: Pulmonary effort is normal. No tachypnea or respiratory distress.     Breath sounds: Normal breath sounds. No decreased breath sounds, wheezing, rhonchi or rales.  Abdominal:     General: Bowel sounds are normal.     Palpations: Abdomen is soft.     Tenderness: There is no abdominal tenderness.  Musculoskeletal:        General: Normal range of motion.     Cervical back: Full passive range of motion without pain.  Lymphadenopathy:     Cervical: No cervical adenopathy.  Skin:    General: Skin is warm and dry.  Neurological:     Mental Status: She is alert.     GCS: GCS eye subscore is 4. GCS verbal subscore is 5. GCS motor subscore is 6.     Cranial Nerves: No cranial nerve deficit.     Sensory: No sensory deficit.     Deep Tendon Reflexes: Reflexes are normal and symmetric.  Psychiatric:         Speech: Speech normal.        Behavior: Behavior normal. Behavior is cooperative.        Assessment and Plan:   Diagnoses and all orders for this visit:  Encounter for general adult medical examination with abnormal findings Today patient counseled on age appropriate routine health concerns for screening and prevention, each reviewed and up to date or declined. Immunizations reviewed and up to date or declined. Labs ordered and reviewed. Risk factors for depression reviewed and negative. Hearing function and visual acuity are intact. ADLs screened and addressed as needed. Functional ability and level of safety  reviewed and appropriate. Education, counseling and referrals performed based on assessed risks today. Patient provided with a copy of personalized plan for preventive services.  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  Memory deficit Update blood work. With strong family hx of dementia, will refer for neurocog testing. -     CBC with Differential/Platelet -     Comprehensive metabolic panel -     TSH -     Vitamin B12 -     Ambulatory referral to Neurology  Caregiver burden; Situational mixed anxiety and depressive disorder Denies any immediate needs but would like talk therapy. -     Ambulatory referral to Psychology  Overweight Encouraged exercise as able.  Special screening for malignant neoplasms, colon -     Ambulatory referral to Gastroenterology  Hyperlipidemia, unspecified hyperlipidemia type  Update lipid panel and provide recommendations as able. -     Lipid panel  Need for prophylactic vaccination against Streptococcus pneumoniae (pneumococcus) -     Pneumococcal polysaccharide vaccine 23-valent greater than or equal to 2yo subcutaneous/IM  Need for prophylactic vaccination with combined diphtheria-tetanus-pertussis (DTP) vaccine -     Tdap vaccine greater than or equal to 7yo IM   CMA or LPN served as  scribe during this visit. History, Physical, and Plan performed by medical provider. The above documentation has been reviewed and is accurate and complete.   [name], PA-C

## 2020-09-11 ENCOUNTER — Other Ambulatory Visit: Payer: Self-pay | Admitting: *Deleted

## 2020-09-11 DIAGNOSIS — E785 Hyperlipidemia, unspecified: Secondary | ICD-10-CM

## 2020-09-11 MED ORDER — ATORVASTATIN CALCIUM 20 MG PO TABS: 20.0000 mg | ORAL_TABLET | Freq: Every day | ORAL | 0 refills | Status: DC

## 2020-09-16 ENCOUNTER — Encounter: Payer: Self-pay | Admitting: Counselor

## 2020-10-01 ENCOUNTER — Encounter: Payer: Self-pay | Admitting: Physician Assistant

## 2020-10-01 DIAGNOSIS — M8580 Other specified disorders of bone density and structure, unspecified site: Secondary | ICD-10-CM | POA: Insufficient documentation

## 2020-10-10 ENCOUNTER — Encounter: Payer: Self-pay | Admitting: Physician Assistant

## 2020-10-10 DIAGNOSIS — D509 Iron deficiency anemia, unspecified: Secondary | ICD-10-CM | POA: Insufficient documentation

## 2020-12-05 ENCOUNTER — Encounter: Payer: BC Managed Care – PPO | Admitting: Counselor

## 2020-12-12 ENCOUNTER — Encounter: Payer: BC Managed Care – PPO | Admitting: Counselor

## 2020-12-18 ENCOUNTER — Encounter: Payer: BC Managed Care – PPO | Admitting: Counselor

## 2020-12-24 ENCOUNTER — Telehealth: Payer: Self-pay

## 2020-12-24 NOTE — Telephone Encounter (Signed)
Contacted patient about this referral and left her a VM giving the name/number to call and get herself scheduled

## 2021-01-01 ENCOUNTER — Ambulatory Visit (HOSPITAL_BASED_OUTPATIENT_CLINIC_OR_DEPARTMENT_OTHER): Payer: BC Managed Care – PPO | Admitting: Internal Medicine

## 2021-01-14 ENCOUNTER — Encounter: Payer: Self-pay | Admitting: Adult Health

## 2021-01-14 ENCOUNTER — Ambulatory Visit (INDEPENDENT_AMBULATORY_CARE_PROVIDER_SITE_OTHER): Payer: BC Managed Care – PPO | Admitting: Adult Health

## 2021-01-14 VITALS — BP 128/77 | HR 76 | Ht 65.0 in | Wt 164.6 lb

## 2021-01-14 DIAGNOSIS — G40309 Generalized idiopathic epilepsy and epileptic syndromes, not intractable, without status epilepticus: Secondary | ICD-10-CM

## 2021-01-14 MED ORDER — LAMOTRIGINE ER 300 MG PO TB24: 1.0000 | ORAL_TABLET | Freq: Every day | ORAL | 3 refills | Status: DC

## 2021-01-14 NOTE — Progress Notes (Signed)
PATIENT: Katie Mcneil DOB: 10/08/1950  REASON FOR VISIT: follow up HISTORY FROM: patient  HISTORY OF PRESENT ILLNESS: Today 01/14/21:  Katie Mcneil is a 70 year female with a history of seizures.  She returns today for follow-up.  She continues on Lamictal extended release 300 mg daily.  She denies any seizure events.  Overall she feels that she is tolerating the medication well.  Denies any new symptoms.  No changes with her gait or balance.  Recently had blood work with her PCP  01/15/20: Katie Mcneil Is a 70 year old female with a history of seizures.  She returns today for follow-up.  She remains on Lamictal extended release 300 mg daily.  She denies any seizure events.  Denies any changes with her gait or balance.  She does report that she is under a lot of stress right now with her daughter who is suffering from mental illness.  She also feels that she may have ADD but this has not been formally diagnosed.  HISTORY 12/14/18:   Katie Mcneil is a 70 year old female with a history of seizures.  She returns today for follow-up.  She remains on Lamictal 150 mg twice a day.  She is able to complete all ADLs independently.  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  She states that when she takes the full dose of Lamictal at bedtime she feels drowsy the next morning.  She states that there is been times that she only took half a dose and she felt better the next morning.  She has not had any seizure events.  She reports that she has headaches but she feels that this is sinus related.  Reports that she has nasal congestion daily.  She returns today for follow-up  REVIEW OF SYSTEMS: Out of a complete 14 system review of symptoms, the patient complains only of the following symptoms, and all other reviewed systems are negative.  See HPI ALLERGIES: No Known Allergies  HOME MEDICATIONS: Outpatient Medications Prior to Visit  Medication Sig Dispense Refill    atorvastatin (LIPITOR) 20 MG tablet Take 1 tablet (20 mg total) by mouth daily. (Patient not taking: Reported on 01/14/2021) 90 tablet 0   LamoTRIgine 300 MG TB24 24 hour tablet Take 1 tablet (300 mg total) by mouth daily. 90 tablet 3   Multiple Vitamins-Minerals (ZINC PO) Take 1 tablet by mouth daily in the afternoon.     Omega-3 Fatty Acids (FISH OIL) 1000 MG CAPS Take 1 capsule by mouth daily in the afternoon.     Vitamin D, Cholecalciferol, 25 MCG (1000 UT) CAPS Take 1 capsule by mouth daily in the afternoon.     No facility-administered medications prior to visit.    PAST MEDICAL HISTORY: Past Medical History:  Diagnosis Date   Dyslipidemia    Seizures ([place])     PAST SURGICAL HISTORY: Past Surgical History:  Procedure Laterality Date   CATARACT EXTRACTION W/ INTRAOCULAR LENS IMPLANT Bilateral 2019   CESAREAN SECTION  1981, 1990   NO PAST SURGERIES     TUBAL LIGATION      FAMILY HISTORY: Family History  Problem Relation Age of Onset   Anuerysm Father    Neuropathy Mother    Seizures Daughter    Other Daughter        Psychological disorder   Leukemia Sister    Alzheimer's disease Sister 43   Dementia Brother    Heart attack Maternal Grandmother    CVA  Maternal Grandfather    Dementia Paternal Grandmother    Colon cancer Neg Hx     SOCIAL HISTORY: Social History   Socioeconomic History   Marital status: Widowed    Spouse name: Not on file   Number of children: 2   Years of education: Not on file   Highest education level: Not on file  Occupational History   Not on file  Tobacco Use   Smoking status: Never   Smokeless tobacco: Never  Vaping Use   Vaping Use: Never used  Substance and Sexual Activity   Alcohol use: Not Currently    Alcohol/week: 0.0 standard drinks   Drug use: No   Sexual activity: Not Currently  Other Topics Concern   Not on file  Social History Narrative   Works in Engineer, 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Civil engineer, contracting   Lives with daughter   Has two children    Widowed   Social Determinants of Health   Financial Resource Strain: Not on file  Food Insecurity: Not on file  Transportation Needs: Not on file  Physical Activity: Not on file  Stress: Not on file  Social Connections: Not on file  Intimate Partner Violence: Not on file      PHYSICAL EXAM  Vitals:   01/14/21 1525  BP: 128/77  Pulse: 76  Weight: 164 lb 9.6 oz (74.7 kg)  Height: '5\' 5"'$  (1.651 m)   Body mass index is 27.39 kg/m.  Generalized: Well developed, in no acute distress   Neurological examination  Mentation: Alert oriented to time, place, history taking. Follows all commands speech and language fluent Cranial nerve II-XII: Pupils were equal round reactive to light. Extraocular movements were full, visual field were full on confrontational test. . Head turning and shoulder shrug  were normal and symmetric. Motor: The motor testing reveals 5 over 5 strength of all 4 extremities. Good symmetric motor tone is noted throughout.  Sensory: Sensory testing is intact to soft touch on all 4 extremities. No evidence of extinction is noted.  Coordination: Cerebellar testing reveals good finger-nose-finger and heel-to-shin bilaterally.  Gait and station: Gait is normal.  Reflexes: Deep tendon reflexes are symmetric and normal bilaterally.   DIAGNOSTIC DATA (LABS, IMAGING, TESTING) - I reviewed patient records, labs, notes, testing and imaging myself where available.  Lab Results  Component Value Date   WBC 5.3 09/10/2020   HGB 11.8 (L) 09/10/2020   HCT 36.6 09/10/2020   MCV 87.6 09/10/2020   PLT 244.0 09/10/2020      Component Value Date/Time   NA 142 09/10/2020 0958   NA 146 (H) 12/14/2018 1017   K 4.0 09/10/2020 0958   CL 105 09/10/2020 0958   CO2 28 09/10/2020 0958   GLUCOSE 88 09/10/2020 0958   BUN 13 09/10/2020 0958   BUN 9 12/14/2018 1017   CREATININE 0.69 09/10/2020 0958   CALCIUM 9.4 09/10/2020 0958   PROT 6.9 09/10/2020 0958   PROT 7.0 12/14/2018  1017   ALBUMIN 4.4 09/10/2020 0958   ALBUMIN 4.6 12/14/2018 1017   AST 17 09/10/2020 0958   ALT 11 09/10/2020 0958   ALKPHOS 80 09/10/2020 0958   BILITOT 0.7 09/10/2020 0958   BILITOT 0.3 12/14/2018 1017   GFRNONAA 80 12/14/2018 1017   GFRAA 92 12/14/2018 1017    Lab Results  Component Value Date   [id number] 09/10/2020   Lab Results  Component Value Date   TSH 1.33 09/10/2020      ASSESSMENT AND PLAN 70 y.o.  year old female  has a past medical history of Dyslipidemia and Seizures ([place]). here with:  1.  Seizures  Continue Lamictal extended release 300 mg daily Advised if she has any seizure event she should let us know Follow-up in 1 year or sooner if needed  Ward Givens, MSN, NP-C 01/14/2021, 3:35 PM [facility] [address] [place], [place] [address] [phone]

## 2021-01-14 NOTE — Progress Notes (Signed)
I have read the note, and I agree with the clinical assessment and plan.  Quantavius Humm K Latif Nazareno   

## 2021-01-14 NOTE — Patient Instructions (Addendum)
Your Plan:  Continue Lamictal  If your symptoms worsen or you develop new symptoms please let us know.   Thank you for coming to see [place] at [facility]. I hope we have been able to provide you high quality care today.  You may receive a patient satisfaction survey over the next few weeks. We would appreciate your feedback and comments so that we may continue to improve ourselves and the health of our patients.

## 2021-02-17 DIAGNOSIS — H10023 Other mucopurulent conjunctivitis, bilateral: Secondary | ICD-10-CM | POA: Diagnosis not present

## 2021-02-26 ENCOUNTER — Encounter: Payer: Self-pay | Admitting: Counselor

## 2021-02-27 ENCOUNTER — Encounter: Payer: BC Managed Care – PPO | Admitting: Counselor

## 2021-02-27 ENCOUNTER — Ambulatory Visit (INDEPENDENT_AMBULATORY_CARE_PROVIDER_SITE_OTHER): Payer: BC Managed Care – PPO | Admitting: Counselor

## 2021-02-27 ENCOUNTER — Encounter: Payer: Self-pay | Admitting: Counselor

## 2021-02-27 ENCOUNTER — Other Ambulatory Visit: Payer: Self-pay

## 2021-02-27 ENCOUNTER — Ambulatory Visit: Payer: BC Managed Care – PPO

## 2021-02-27 DIAGNOSIS — G3184 Mild cognitive impairment, so stated: Secondary | ICD-10-CM

## 2021-02-27 DIAGNOSIS — Z636 Dependent relative needing care at home: Secondary | ICD-10-CM

## 2021-02-27 DIAGNOSIS — R413 Other amnesia: Secondary | ICD-10-CM

## 2021-02-27 DIAGNOSIS — F09 Unspecified mental disorder due to known physiological condition: Secondary | ICD-10-CM

## 2021-02-27 NOTE — Progress Notes (Signed)
[facility]  Patient Name: Katie Mcneil MRN: [id number] Date of Birth: June 19, 1950 Age: 70 y.o. Education: 18 years  Referral Circumstances and Background Information  Ms. Samoria Socha is a 70 y.o., right-hand dominant, widowed woman with a history of generalized convulsive epilepsy, pure cholesterolemia, and memory and thinking problems referred by [name], PA with [place].   On interview, the patient reported that her memory problems started in 2013 when she was having a hard time at work. She was on antiepileptic medication and reported that she thinks that her difficulties at work were related to that. She was forgetting to get assignments done. Eventually, her medications were changed and she felt better. More recently, over the past 1 to 2 years she has started "drawing blanks" and having difficulties with recall of information. It doesn't sound like the difficulty she appreciates has been progressively worsening. 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 She is concerned that something could be starting, because she has a strong family history of dementia. On specific review of symptoms, she has no rapid forgetting of information, she is not repeating herself, she does not have difficulties with orientation to time, although she may have very minor difficulties with time relationships. She denied any problems with decision making and problem solving or attention/concentration. With respect to mood, the patient's daughter has mental health issues (she is 9, has not been able to work, and lives at home). Her mother also has advanced neuropathy and she tries to stay with her 3 or 4 days a week. She lives about 3 hours away. The patient says that she is "subconsciously depressed," her daughter has told her  she thinks she is depressed and needs to see someone (daughter has experience with mental health). She reported that her energy is "ok," although she doesn't get enough sleep. She typically gets no more than 5 or 6 hours, she is "up doing things" and realizes this is not healthy but has a hard time changing her behavior. She sleeps well when she does go to bed but she never feels rested when she gets up. Her appetite and weight are normal for her.   The patient denied that there is anything that she is having a hard time doing as a result of her memory and thinking problems. She denied that anyone has taken any issues with her performance at work, it sounds like she is doing fine. She has no difficulties driving, she is not getting lost, she has no accidents, she goes to see her mother several hours a way and has no problems navigating. She manages her own medications and her medical appointments. She forgets appointments occasionally, although she is good about taking her medication. The patient is independent with financial management, she pays her own bills and has no issues doing so. She is able to cook and has no problems with that. She is not forgetting ingredients. She does like to eat out a fair amount (often fast food). She spends a fair amount of time taking care of her family, her daughter and her mother, and it sounds like she doesn't have enough time for herself.   Past Medical History and Review of Relevant Studies   Patient Active Problem List   Diagnosis Date Noted   Iron deficiency anemia 10/10/2020   Osteopenia 10/01/2020  Encounter for long-term (current) use of other medications 11/01/2013   Generalized convulsive epilepsy ([place]) 11/01/2013    Review of Neuroimaging and Relevant Medical History: The patient struck her head and lost consciousness on one occasion, although it's not clear her LOC was related to the blow to her head. This was in 2006. She has diminished recall for this  event (although again, she was probably postictal). She has generalized convulsive epilepsy, which developed in 2006. She had two seizures and has not had any since she got on Lamotrigine 16 years ago. She has no strokes or neurological surgeries.   Patient receives care for her seizure disorder at [facility], she is following with Vaughan Browner, NP. Megan's notes reviewed and appreciated. No abnormalities on elemental neurological exam at most recent appointment (01/14/2021).   Current Outpatient Medications  Medication Sig Dispense Refill   atorvastatin (LIPITOR) 20 MG tablet Take 1 tablet (20 mg total) by mouth daily. (Patient not taking: Reported on 01/14/2021) 90 tablet 0   LamoTRIgine 300 MG TB24 24 hour tablet Take 1 tablet (300 mg total) by mouth daily. 90 tablet 3   Multiple Vitamins-Minerals (ZINC PO) Take 1 tablet by mouth daily in the afternoon.     Omega-3 Fatty Acids (FISH OIL) 1000 MG CAPS Take 1 capsule by mouth daily in the afternoon.     Vitamin D, Cholecalciferol, 25 MCG (1000 UT) CAPS Take 1 capsule by mouth daily in the afternoon.     No current facility-administered medications for this visit.   Family History  Problem Relation Age of Onset   Anuerysm Father    Neuropathy Mother    Seizures Daughter    Other Daughter        Psychological disorder   Leukemia Sister    Alzheimer's disease Sister 4   Dementia Brother    Heart attack Maternal Grandmother    CVA Maternal Grandfather    Dementia Paternal Grandmother    Colon cancer Neg Hx    There is a family history of dementia. Her sister died of the condition when she was 3, it was diagnosed as Alzheimer's. Her brother has not been subtyped but he has been having cognitive difficulties and holds a dementia diagnosis at age 52. She said that he has a lot of problems related to exposure to chemicals from working in floor cleaning. She reported there are other family members with dementia, although it may not have been  neurodegenerative. Her father was demented but had a ruptured aneurysm. Her maternal grandmother had dementia, although she was at least in her 31s. There is a family history of psychiatric illness. The patient's daughter has depression, anxiety, and it sounds like she is quite incapacitated. She also has a niece with significant mental illness.   Psychosocial History  Developmental, Educational and Employment History: The patient is a native of [place], [place]. She reported a normal childhood. She did fairly well in school and denied ever being held back or having any problems. 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 She then did a masters in [place] at [employer]. 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 She left that industry in 2013. She now works in Catering manager, which she finds  less challenging and lower stress. She reported that she likes her job although she would like to be challenged more.   Psychiatric History: The patient has no psychiatric history of note.  Substance Use History: The patient does not use illicit substances, does not consume alcohol, and does not smoke.   Relationship History and Living Cimcumstances: The patient was married for 27 years and her husband passed in 2005. She is not in a relationship currently. She has one daughter at home and one daughter who lives in [place].   Mental Status and Behavioral Observations  Sensorium/Arousal: The patient's level of arousal was awake and alert. Hearing and vision were adequate for testing purposes. Orientation: The patient was alert and oriented to person, place, time, and situation.  Appearance: Dressed in appropriate, casual clothing with adequate grooming and  hygiene.  Behavior: Patient was adequately engaged in the interview and testing, approach to testing was persistent and she seemed adequately engaged.  Speech/language: Normal in rate, rhythm, volume and prosody Gait/Posture: Normal, narrow based Movement: No rest tremor or other concerning abnormalities noted Social Comportment: Pleasant, appropriate Mood: "Subconsciously depressed." Affect: Neutral to euthymic Thought process/content: Thought process was logical and goal oriented, she had no difficulty answering questions to provide a detailed history. Thought content was appropriate to the topics discussed.  Safety: No concerns identified in this euthymic patient Insight: Good  Montreal Cognitive Assessment  02/27/2021  Visuospatial/ Executive (0/5) 2  Naming (0/3) 3  Attention: Read list of digits (0/2) 2  Attention: Read list of letters (0/1) 1  Attention: Serial 7 subtraction starting at 100 (0/3) 3  Language: Repeat phrase (0/2) 1  Language : Fluency (0/1) 0  Abstraction (0/2) 2  Delayed Recall (0/5) 4  Orientation (0/6) 6  Total 24  Adjusted Score (based on education) 24   Test Procedures  Wide Range Achievement Test - 4             Word Reading Reynolds Intellectual Screening Test Neuropsychological Assessment Battery  Memory Module  Naming  Digit Span Repeatable Battery for the Assessment of Neuropsychological Status (Form A)  Figure Copy  Judgment of Line Orientation  Coding  Figure Recall The Dot Counting Test A Random Letter Test Controlled Oral Word Association (F-A-S) Semantic Fluency (Animals) Trail Making Test A & B Complex Ideational Material Modified [place] Card Sorting Test Geriatric Depression Scale - Short Form  Plan  Dimitra Malafronte was seen for a psychiatric diagnostic evaluation and neuropsychological testing. She is a 70 year old, right-hand dominant widowed woman with a strong family history of AD (2/3 of her progeny) presenting for  baseline assessment. The changes she notices are fairly mild and mainly involve word finding problems and delayed retrieval of information. Nevertheless, it seems reasonable to proceed and this may be therapeutic for her in terms of providing reassurance. She is performing within normal limits on the MoCA, considering appropriate demographic adjustment. Full and complete note with impressions, recommendations, and interpretation of test data to follow.   Viviano Simas Nicole Kindred, PsyD, [place] Clinical Neuropsychologist  Informed Consent  Risks and benefits of the evaluation were discussed with the patient prior to all testing procedures. I conducted a clinical interview and neuropsychological testing (at least two tests) with Arlester Marker and Lamar Benes, B.S. (Technician) administered additional test procedures. The patient was able to tolerate the testing procedures and the patient (and/or family if applicable) is likely to benefit from further follow up to receive the diagnosis and treatment recommendations, which will be  rendered at the next encounter.

## 2021-02-27 NOTE — Progress Notes (Signed)
   Psychometrist Note   Cognitive testing was administered to Katie Mcneil by Katie Mcneil, B.S. (Technician) under the supervision of Katie Mcneil, Psy.D., ABN. Katie Mcneil was able to tolerate all test procedures. Katie Mcneil met with the patient as needed to manage any emotional reactions to the testing procedures. Rest breaks were offered.    The battery of tests administered was selected by Katie Mcneil with consideration to the patient's current level of functioning, the nature of her symptoms, emotional and behavioral responses during the interview, level of literacy, observed level of motivation/effort, and the nature of the referral question. This battery was communicated to the psychometrist. Communication between Katie Mcneil and the psychometrist was ongoing throughout the evaluation and Katie Mcneil was immediately accessible at all times. Katie Mcneil provided supervision to the technician on the date of this service, to the extent necessary to assure the quality of all services provided.    Katie Mcneil will return in approximately one week for an interactive feedback session with Katie Mcneil, at which time test performance, clinical impressions, and treatment recommendations will be reviewed in detail. The patient understands she can contact our office should she require our assistance before this time.   A total of 125 minutes of billable time were spent with Katie Mcneil by the technician, including test administration and scoring time. Billing for these services is reflected in Katie Mcneil note.   This note reflects time spent with the psychometrician and does not include test scores, clinical history, or any interpretations made by Katie Mcneil. The full report will follow in a separate note.

## 2021-02-27 NOTE — Progress Notes (Signed)
[facility]  Patient Name: Katie Mcneil MRN: [id number] Date of Birth: 03-May-1951 Age: 70 y.o. Education: 18 years  Measurement properties of test scores: IQ, Index, and Standard Scores (SS): Mean = 100; Standard Deviation = 15 Scaled Scores (Ss): Mean = 10; Standard Deviation = 3 Z scores (Z): Mean = 0; Standard Deviation = 1 T scores (T); Mean = 50; Standard Deviation = 10  TEST SCORES:    Note: This summary of test scores accompanies the interpretive report and should not be interpreted by unqualified individuals or in isolation without reference to the report. Test scores are relative to age, gender, and educational history as available and appropriate.   Performance Validity        "A" Random Letter Test Raw  Descriptor      Errors 0 Within Expectation  The Dot Counting Test: 26 Below Expectation      Mental Status Screening     Total Score Descriptor  MoCA 24 Normal      Expected Functioning        Wide Range Achievement Test: Standard/Scaled Score Percentile      Word Reading 109 73      Reynolds Intellectual Screening Test Standard/T-score Percentile      Guess What 52 58      Odd Item Out 48 42  RIST Index 100 50      Attention/Processing Speed        Neuropsychological Assessment Battery (Attention Module, Form 1): Scaled/T-score Percentile      Digits Forward 68 96      Digits Backwards 45 31      Repeatable Battery for the Assessment of Neuropsychological Status (Form A): Standard Score Percentile     Coding 4 2      Language        Neuropsychological Assessment Battery (Language Module, Form 1): T-score Percentile      Naming   (31) 56 73      Verbal Fluency:  T Score Percentile      Controlled Oral Word Association (F-A-S) 41 18      Semantic Fluency (Animals) 58 79      Memory:        Neuropsychological Assessment Battery (Memory Module, Form 1): T-score/Standard Score Percentile  Memory Index (MEM):  98 45      List Learning           List A Immediate Recall   (6 , 6 , 9) 41 18         List B Immediate Recall   (4) 44 27         List A Short Delayed Recall   (5) 34 5         List A Long Delayed Recall   (5) 36 8         List A Percent Retention   (100 %) --- 54         List A Long Delayed Yes/No Recognition Hits   (8) --- 3         List A Long Delayed Yes/No Recognition False Alarms   (4) --- 42         List A Recognition Discriminability Index --- 16      Shape Learning           Immediate Recognition   (5 , 7 , 8) 60 84         Delayed Recognition   (6) 50  50         Percent Retention   (75 %) --- 25         Delayed Forced-Choice Recognition Hits   (7) --- 31         Delayed Forced-Choice Recognition False Alarms   (1) --- 50         Delayed Forced-Choice Recognition Discriminability --- 38      Story Learning           Immediate Recall   (31, 39) 55 69         Delayed Recall   (38) 60 84         Percent Retention   (97 %) --- 66      Daily Living Memory            Immediate Recall   (26, 19) 52 58          Delayed Recall   (9, 7) 59 82          Percent Retention (94 %) --- 69          Recognition Hits   (9) --- 58      Repeatable Battery for the Assessment of Neuropsychological Status (Form A): Scaled Score Percentile         Figure Recall   (11) 8 25      Visuospatial/Constructional Functioning        Repeatable Battery for the Assessment of Neuropsychological Status (Form A): Standard/Scaled Score Percentile      Visuospatial/Constructional Index 100 50         Figure Copy   (20) 14 91         Judgment of Line Orientation   (12) --- 3-9      Executive Functioning        Modified [place] Card Sorting Test (MWCST): Standard/T-Score Percentile      Number of Categories Correct 35 7      Number of Perseverative Errors 43 25      Number of Total Errors 38 12      Percent Perseverative Errors 51 54  Executive Function Composite 82 12      Trail Making Test: T-Score  Percentile      Part A 35 7      Part B 47 38      [place] Diagnostic Aphasia Exam: Raw Score Scaled Score      Complex Ideational Material 12 12      Clock Drawing Raw Score Descriptor      Command 8 Borderline Impairment      Rating Scales         Raw Score Descriptor  Patient Health Questionnaire - 9 6 Mild  GAD-7 0 Within Normal Limits   Aviel Davalos V. Nicole Kindred PsyD, [place] Clinical Neuropsychologist

## 2021-03-01 DIAGNOSIS — S59291A Other physeal fracture of lower end of radius, right arm, initial encounter for closed fracture: Secondary | ICD-10-CM | POA: Diagnosis not present

## 2021-03-01 DIAGNOSIS — G40909 Epilepsy, unspecified, not intractable, without status epilepticus: Secondary | ICD-10-CM | POA: Insufficient documentation

## 2021-03-01 DIAGNOSIS — M8580 Other specified disorders of bone density and structure, unspecified site: Secondary | ICD-10-CM | POA: Diagnosis not present

## 2021-03-01 DIAGNOSIS — S52501A Unspecified fracture of the lower end of right radius, initial encounter for closed fracture: Secondary | ICD-10-CM | POA: Diagnosis not present

## 2021-03-01 DIAGNOSIS — W1839XA Other fall on same level, initial encounter: Secondary | ICD-10-CM | POA: Diagnosis not present

## 2021-03-01 DIAGNOSIS — G40309 Generalized idiopathic epilepsy and epileptic syndromes, not intractable, without status epilepticus: Secondary | ICD-10-CM | POA: Diagnosis not present

## 2021-03-02 NOTE — Progress Notes (Signed)
[facility]  Patient Name: Katie Mcneil MRN: [id number] Date of Birth: [birth year] Age: 70 y.o. 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 She reported that her sister, brother, and potentially other family members all developed the condition, although it's not entirely clear if all of these dementias were neurodegenerative, which might make them less concerning in terms of her genetic risk. She notices minimal day-to-day changes and is still working but does "draw a blank" when trying to recall things.   On neuropsychological evaluation, Katie Mcneil demonstrated average overall ability and performance within expectations of that standard in essentially all areas assessed. This is with the exception of processing speed measures, which were low. These measures could reflect speed accuracy tradeoff or test taking approach or they could reflect legitimate difficulties with underlying task demands. Positively, she demonstrated very good performance on language measures that tend to be specific for Alzheimer's and average memory performance overall. She reported mild levels of depressive symptoms, and all the symptoms she reported were somatic so this may be a false positive.   Katie Mcneil is thus demonstrating reasonable cognitive performance. She did have some low processing speed scores that could reflect decline although it's not clear these meet the burden for a frank diagnosis of mild neurocognitive disorder. If there were an underlying cause for her cognitive difficulties and these represented actual findings, I would think a vascular etiology most likely. MRI could be considered if the patient desires aggressive  workup but otherwise, I would simply have her re-evaluated as needed. She certainly has no findings concerning for Alzheimer's or other neurodegenerative pathology and will be reassured. I am not convinced she has a clinically diagnosable affective disorder either, although she expressed interest in therapy and has already been referred. I will discuss with her genetic contributions to dementia, I do not think she needs to be overly worried given the family history she reports. She may also benefit from [place], exercise, stress management, and setting boundaries around her care giving responsibilities as needed.   Diagnostic Impressions: Cognitive complaints with normal neuropsychological exam  Test Findings  Test scores are summarized in additional documentation associated with this encounter. Test scores are relative to age, gender, and educational history as available and appropriate. There were no concerns about performance validity as all findings fell within normal expectations.   General Intellectual Functioning/Achievement:  Performance on single word reading was toward the upper end of the average range. Performance was average on the RIST index, with comparable average range scores on the more visually and verbally oriented subtests. The RIST index was uses a standard of comparison for cognitive test performance (particularly memory testing).  Attention and Processing Efficiency: Performance on indicators of attention was good, with superior range digit repetition forward and average digit repetition backward. While scores on both these measures are within normal limits, the latter finding does represent a relative weakness.  Performance on timed measures of processing speed was weak, with extremely low timed number-symbol coding and unusually low simple numeric sequencing. The latter measure is a particularly easy task. It is unclear to me whether this reflects speed accuracy trade off,  test taking style, or bona fide difficulties with processing speed.  Language: Visual object confrontation naming was errorless. Generation of words  was low average in response to the letters F-A-S whereas generation of words in response to the category prompt "animals" was high average.  Visuospatial Function: Performance on visuospatial/constructional measures was average. Copy of a line drawing was errorless. By contrast, her performance was unusually low on judgment of angular line orientations. I do not think this is particularly concerning given that intact construction implies intact perception.  Learning and Memory: Performance on measures of learning and memory fell at an average level overall on the NAB memory index. Good acquisition and retention of information across time was demonstrated in both visual and verbal domains.  In the verbal realm, immediate recall of a 12-item word list was 6, 6, and 9 words across 3 learning trials, which is low average. Her short delayed recall was unusually low at 5 words and her long delayed recall was also unusually low at 5 words. Delayed yes/no recognition discrimination for words from the list versus false choices was low average. Memory for short story was average on immediate recall and high average on delayed recall with very good retention. Memory for brief daily living type information was average on immediate recall and high average on delayed recall, with good average range retention and recognition.  In the visual realm, immediate recognition for a series of designs that is difficult to verbally encode was 5, 7, and 8 of a possible 9 designs across 3 learning trials, which is high average. Delayed recognition for the designs was good and fell at an average level. Retention of information across time was also average, as well as delayed force choice recognition discriminability using a yes/no format. Delayed recall for a modestly complex  geometric figure was average.  Executive Functions: Performance on executive indicators was within gross limits of normal, with a low average score on the Executive Function Composition of the [employer]. She had an unusually low categories completed score, but her perseverative errors score was low average. Alternating sequencing of numbers and letters of the alphabet was average on the Trail Making Test B. She performed carelessly when reasoning with verbal information on the complex ideational material. 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  Rating Scale(s): Ms. Hottenstein reported a mild level of depressive symptoms, although given that most of the symptoms she reports are somatic in nature, I think this may be a false positive.  Viviano Simas Nicole Kindred, PsyD, ABN Clinical Neuropsychologist  Coding and Compliance  Billing below reflects technician time, my direct face-to-face time with the patient, time spent in test administration, and time spent in professional activities including but not limited to: neuropsychological test interpretation, integration of neuropsychological test data with clinical history, report preparation, treatment planning, care coordination, and review of diagnostically pertinent medical history or studies.   Services associated with this encounter: Clinical Interview [phone]) plus 160 minutes ([id number]; Neuropsychological Evaluation by Professional)  125 minutes (96138/[id number]; Neuropsychological Testing by Technician)

## 2021-03-06 ENCOUNTER — Ambulatory Visit (INDEPENDENT_AMBULATORY_CARE_PROVIDER_SITE_OTHER): Payer: BC Managed Care – PPO | Admitting: Counselor

## 2021-03-06 ENCOUNTER — Encounter: Payer: Self-pay | Admitting: Counselor

## 2021-03-06 ENCOUNTER — Other Ambulatory Visit: Payer: Self-pay

## 2021-03-06 DIAGNOSIS — R419 Unspecified symptoms and signs involving cognitive functions and awareness: Secondary | ICD-10-CM | POA: Diagnosis not present

## 2021-03-06 NOTE — Progress Notes (Signed)
   [facility]  Feedback Note: I met with Katie Mcneil to review the findings resulting from her neuropsychological evaluation. She presented with her daughter, Katie Mcneil, who said she has noticed maybe a "little" decline over the past 5 years or so, like the patient's brain has "slowed down just a bit." She is not really concerned. Since the last appointment, she has broken her wrist. She has been having balance problems. She mentioned these to [name] at her last appointment and she was not concerned. Time was spent reviewing the impressions and recommendations that are detailed in the evaluation report. We discussed impression of essentially normal performance, albeit with a few scattered low processing speed scores that could be picking up on something. I do not think I have enough confidence in these findings to formally provide a neurocognitive diagnosis, and they could be easily due to medication side effects. There are certainly no findings concerning for Alzheimer's with average (overall) memory performance, as reflected in the patient instructions. I took time to explain the findings and answer all the patient's questions. I encouraged [name] to contact me should she have any further questions or if further follow up is desired.   Current Medications and Medical History   Current Outpatient Medications  Medication Sig Dispense Refill   atorvastatin (LIPITOR) 20 MG tablet Take 1 tablet (20 mg total) by mouth daily. (Patient not taking: Reported on 01/14/2021) 90 tablet 0   LamoTRIgine 300 MG TB24 24 hour tablet Take 1 tablet (300 mg total) by mouth daily. 90 tablet 3   Multiple Vitamins-Minerals (ZINC PO) Take 1 tablet by mouth daily in the afternoon.     Omega-3 Fatty Acids (FISH OIL) 1000 MG CAPS Take 1 capsule by mouth daily in the afternoon.     Vitamin D, Cholecalciferol, 25 MCG (1000 UT) CAPS Take 1 capsule by mouth daily in the afternoon.      No current facility-administered medications for this visit.    Patient Active Problem List   Diagnosis Date Noted   Iron deficiency anemia 10/10/2020   Osteopenia 10/01/2020   Encounter for long-term (current) use of other medications 11/01/2013   Generalized convulsive epilepsy ([place]) 11/01/2013    Mental Status and Behavioral Observations  Katie Mcneil presented on time to the present encounter and was alert and generally oriented. Speech was normal in rate, rhythm, volume, and prosody. Self-reported mood was "good" and affect was anxious. She expressed great concern about her cognitive test findings and was worried that she would be getting bad news. Thought process was logical and goal oriented and thought content was appropriate. There were no safety concerns identified at today's encounter, such as thoughts of harming self or others.   Plan  Feedback provided regarding the patient's neuropsychological evaluation. She is doing fairly  well from a cognitive perspective, does have some marginal processing speed findings that could reflect medication effects or another etiology but are not very concerning for a progressive condition without any other impaired areas or real signs/symptoms of functional decline. Katie Mcneil was encouraged to contact me if any questions arise or if further follow up is desired.   Viviano Simas Nicole Kindred, PsyD, ABN Clinical Neuropsychologist  Service(s) Provided at This Encounter: 28 minutes [phone]; Conjoint therapy with patient present)

## 2021-03-06 NOTE — Patient Instructions (Signed)
We discussed your performance and presentation on assessment today, which were largely normal. You did have a few scattered low scores on measures of processing speed and maybe other frontal-subcortical abilities and an argument could be made for a VERY mild level of cognitiive difficulty, but I am not convinced.   If you are satisfied with this reassurance, then I think you can return as needed. If you would like a more aggressive approach and work up, it would be reasonable to obtain an MRI of the brain so that we can see if there are any areas of vascular damage or shrinkage or other things that might be amenable to treatment.   We also discussed prevention of dementia, using dietary and other strategies and the importance of not overfocusing on cognitive performance.   Avoid overfocusing on cognitive performance. Memory and cognition are notoriously fallible and if you are looking for cognitive problems, you are bound to find them. Once someone gets worried about their memory and thinking, they may overfocus on how they are doing day-to-day, and then when normal day-to-day cognitive errors are made, this becomes a cause for more concern. This concern and anxiety then decreases focus from the task at hand, reducing concentration, causing more cognitive problems, and creating a vicious cycle. Rather than critiquing your performance, I would encourage you to remain present minded and focus on the task at hand. Perhaps most importantly, have reasonable expectations for yourself.  Healthy people forget things, lose focus, and do not perform 100% correctly all the time. Some cognitive errors are normal and are not necessarily a sign that there is something wrong with your brain.   There is now good quality evidence from at least one large scale study that a modified mediterranean diet may help slow cognitive decline. This is known as the "MIND" diet. The Mind diet is not so much a specific diet as it is a set  of recommendations for things that you should and should not eat.   Foods that are ENCOURAGED on the MIND Diet:  Green, leafy vegetables: Aim for six or more servings per week. This includes kale, spinach, cooked greens and salads.  All other vegetables: Try to eat another vegetable in addition to the green leafy vegetables at least once a day. It is best to choose non-starchy vegetables because they have a lot of nutrients with a low number of calories.  Berries: Eat berries at least twice a week. There is a plethora of research on strawberries, and other berries such as blueberries, raspberries and blackberries have also been found to have antioxidant and brain health benefits.  Nuts: Try to get five servings of nuts or more each week. The creators of the [place] don't specify what kind of nuts to consume, but it is probably best to vary the type of nuts you eat to obtain a variety of nutrients. Peanuts are a legume and do not fall into this category.  Olive oil: Use olive oil as your main cooking oil. There may be other heart-healthy alternatives such as algae oil, though there is not yet sufficient research upon which to base a formal recommendation.  Whole grains: Aim for at least three servings daily. Choose minimally processed grains like oatmeal, quinoa, brown rice, whole-wheat pasta and 100% whole-wheat bread.  Fish: Eat fish at least once a week. It is best to choose fatty fish like salmon, sardines, trout, tuna and mackerel for their high amounts of omega-3 fatty acids.  Beans: Include  beans in at least four meals every week. This includes all beans, lentils and soybeans.  Poultry: Try to eat chicken or [place] at least twice a week. Note that fried chicken is not encouraged on the MIND diet.  Wine: Aim for no more than one glass of alcohol daily. Both red and white wine may benefit the brain. However, much research has focused on the red wine compound resveratrol, which may help protect  against Alzheimer's disease.  Foods that are DISCOURAGED on the MIND Diet: Butter and margarine: Try to eat less than 1 tablespoon (about 14 grams) daily. Instead, try using olive oil as your primary cooking fat, and dipping your bread in olive oil with herbs.  Cheese: The MIND diet recommends limiting your cheese consumption to less than once per week.  Red meat: Aim for no more than three servings each week. This includes all beef, pork, lamb and products made from these meats.  [place] food: The MIND diet highly discourages fried food, especially the kind from fast-food restaurants. Limit your consumption to less than once per week.  Pastries and sweets: This includes most of the processed junk food and desserts you can think of. Ice cream, cookies, brownies, snack cakes, donuts, candy and more. Try to limit these to no more than four times a week.  Exercise is one of the best medicines for promoting health and maintaining cognitive fitness at all stages in life. Exercise probably has the largest documented effect on brain health and performance of any lifestyle intervention. Studies have shown that even previously sedentary individuals who start exercising as late as age 61 show a significant survival benefit as compared to their non-exercising peers. In the [place], the current guidelines are for 30 minutes of moderate exercise per day, but increasing your activity level less than that may also be helpful. You do not have to get your 30 minutes of exercise in one shot and exercising for short periods of time spread throughout the day can be helpful. Go for several walks, learn to dance, or do something else you enjoy that gets your body moving. Of course, if you have an underlying medical condition or there is any question about whether it is safe for you to exercise, you should consult a medical treatment provider prior to beginning exercise.   I would like to reassure you that family history is  not destiny. Age is always the biggest risk factor for dementia, even in individuals with a strong family history. Your family history is not particularly concerning, given that we are not even sure that your brother's condition is due to Alzheimer's or neurodegeneration (you said he had issues with chemical exposure).

## 2021-03-09 DIAGNOSIS — S52571A Other intraarticular fracture of lower end of right radius, initial encounter for closed fracture: Secondary | ICD-10-CM | POA: Diagnosis not present

## 2021-03-14 ENCOUNTER — Emergency Department (HOSPITAL_BASED_OUTPATIENT_CLINIC_OR_DEPARTMENT_OTHER): Payer: BC Managed Care – PPO

## 2021-03-14 ENCOUNTER — Encounter (HOSPITAL_BASED_OUTPATIENT_CLINIC_OR_DEPARTMENT_OTHER): Payer: Self-pay

## 2021-03-14 ENCOUNTER — Emergency Department (HOSPITAL_BASED_OUTPATIENT_CLINIC_OR_DEPARTMENT_OTHER)
Admission: EM | Admit: 2021-03-14 | Discharge: 2021-03-15 | Disposition: A | Discharge status: Home / Self Care | Payer: BC Managed Care – PPO | Attending: Emergency Medicine | Admitting: Emergency Medicine

## 2021-03-14 ENCOUNTER — Other Ambulatory Visit: Payer: Self-pay

## 2021-03-14 DIAGNOSIS — J029 Acute pharyngitis, unspecified: Secondary | ICD-10-CM | POA: Insufficient documentation

## 2021-03-14 DIAGNOSIS — R0602 Shortness of breath: Secondary | ICD-10-CM | POA: Insufficient documentation

## 2021-03-14 DIAGNOSIS — Z20822 Contact with and (suspected) exposure to covid-19: Secondary | ICD-10-CM | POA: Diagnosis not present

## 2021-03-14 DIAGNOSIS — J069 Acute upper respiratory infection, unspecified: Secondary | ICD-10-CM

## 2021-03-14 DIAGNOSIS — R0981 Nasal congestion: Secondary | ICD-10-CM | POA: Diagnosis not present

## 2021-03-14 DIAGNOSIS — C8590 Non-Hodgkin lymphoma, unspecified, unspecified site: Secondary | ICD-10-CM

## 2021-03-14 DIAGNOSIS — R059 Cough, unspecified: Secondary | ICD-10-CM | POA: Insufficient documentation

## 2021-03-14 HISTORY — DX: Non-Hodgkin lymphoma, unspecified, unspecified site: C85.90

## 2021-03-14 LAB — CBC WITH DIFFERENTIAL/PLATELET
Abs Immature Granulocytes: 0.04 10*3/uL (ref 0.00–0.07)
Basophils Absolute: 0.1 10*3/uL (ref 0.0–0.1)
Basophils Relative: 1 %
Eosinophils Absolute: 0.4 10*3/uL (ref 0.0–0.5)
Eosinophils Relative: 3 %
HCT: 33 % — ABNORMAL LOW (ref 36.0–46.0)
Hemoglobin: 10.2 g/dL — ABNORMAL LOW (ref 12.0–15.0)
Immature Granulocytes: 0 %
Lymphocytes Relative: 27 %
Lymphs Abs: 3.5 10*3/uL (ref 0.7–4.0)
MCH: 27.4 pg (ref 26.0–34.0)
MCHC: 30.9 g/dL (ref 30.0–36.0)
MCV: 88.7 fL (ref 80.0–100.0)
Monocytes Absolute: 1 10*3/uL (ref 0.1–1.0)
Monocytes Relative: 8 %
Neutro Abs: 8.2 10*3/uL — ABNORMAL HIGH (ref 1.7–7.7)
Neutrophils Relative %: 61 %
Platelets: UNDETERMINED 10*3/uL (ref 150–400)
RBC: 3.72 MIL/uL — ABNORMAL LOW (ref 3.87–5.11)
RDW: 13.6 % (ref 11.5–15.5)
WBC: 13.1 10*3/uL — ABNORMAL HIGH (ref 4.0–10.5)
nRBC: 0 % (ref 0.0–0.2)

## 2021-03-14 LAB — BASIC METABOLIC PANEL
Anion gap: 9 (ref 5–15)
BUN: 16 mg/dL (ref 8–23)
CO2: 25 mmol/L (ref 22–32)
Calcium: 8.9 mg/dL (ref 8.9–10.3)
Chloride: 104 mmol/L (ref 98–111)
Creatinine, Ser: 0.56 mg/dL (ref 0.44–1.00)
GFR, Estimated: >60 mL/min (ref >60)
Glucose, Bld: 120 mg/dL — ABNORMAL HIGH (ref 70–99)
Potassium: 3.6 mmol/L (ref 3.5–5.1)
Sodium: 138 mmol/L (ref 135–145)

## 2021-03-14 LAB — RESP PANEL BY RT-PCR (FLU A&B, COVID) ARPGX2
Influenza A by PCR: NEGATIVE
Influenza B by PCR: NEGATIVE
SARS Coronavirus 2 by RT PCR: NEGATIVE

## 2021-03-14 MED ORDER — AEROCHAMBER PLUS FLO-VU MEDIUM MISC
1.0000 | Freq: Once | Status: AC
  Administered 2021-03-14: 1
  Filled 2021-03-14: qty 1

## 2021-03-14 MED ORDER — IOHEXOL 350 MG/ML SOLN
80.0000 mL | Freq: Once | INTRAVENOUS | Status: AC | PRN
  Administered 2021-03-14: 80 mL via INTRAVENOUS

## 2021-03-14 MED ORDER — ALBUTEROL SULFATE HFA 108 (90 BASE) MCG/ACT IN AERS
INHALATION_SPRAY | RESPIRATORY_TRACT | Status: AC
  Administered 2021-03-14: 2 via RESPIRATORY_TRACT
  Filled 2021-03-14: qty 6.7

## 2021-03-14 MED ORDER — ALBUTEROL SULFATE HFA 108 (90 BASE) MCG/ACT IN AERS: 2.0000 | INHALATION_SPRAY | RESPIRATORY_TRACT | Status: DC | PRN

## 2021-03-14 NOTE — ED Provider Notes (Signed)
[facility] EMERGENCY DEPT Provider Note   CSN: [id number] Arrival date & time: 03/14/21  2121     History Chief Complaint  Patient presents with   Cough   Shortness of Breath    Katie Mcneil is a 70 y.o. female presenting to emergency department the positive D-dimer test.  The patient ports she had a sore throat, cough, shortness of breath that began yesterday.  She thought she had a viral illness.  She went to [facility] urgent care, where she had an x-ray, per medical records review this was reportedly normal.  However she had a D-dimer test at that place which was positive.  She was told to come to the ER for further evaluation.  She denies any chest pain or pressure.  She is at the COVID vaccines +2 booster shots, most recently on Monday for second booster.  No hemoptysis or asymmetric LE edema. Patient denies personal hx of DVT or PE, but does report family hx of PE.  No recent hormone use (including OCP); travel for >6 hours; prolonged immobilization for greater than 3 days; surgeries or trauma in the last 4 weeks; or malignancy with treatment within 6 months.   HPI     Past Medical History:  Diagnosis Date   Dyslipidemia    Seizures ([place])     Patient Active Problem List   Diagnosis Date Noted   Iron deficiency anemia 10/10/2020   Osteopenia 10/01/2020   Encounter for long-term (current) use of other medications 11/01/2013   Generalized convulsive epilepsy ([place]) 11/01/2013    Past Surgical History:  Procedure Laterality Date   CATARACT EXTRACTION W/ INTRAOCULAR LENS IMPLANT Bilateral 2019   CESAREAN SECTION  1981, 1990   NO PAST SURGERIES     TUBAL LIGATION       OB History   No obstetric history on file.     Family History  Problem Relation Age of Onset   Anuerysm Father    Neuropathy Mother    Seizures Daughter    Other Daughter        Psychological disorder   Leukemia Sister    Alzheimer's disease Sister 59   Dementia Brother     Heart attack Maternal Grandmother    CVA Maternal Grandfather    Dementia Paternal Grandmother    Colon cancer Neg Hx     Social History   Tobacco Use   Smoking status: Never   Smokeless tobacco: Never  Vaping Use   Vaping Use: Never used  Substance Use Topics   Alcohol use: Not Currently    Alcohol/week: 0.0 standard drinks   Drug use: No    Home Medications Prior to Admission medications   Medication Sig Start Date End Date Taking? Authorizing Provider  atorvastatin (LIPITOR) 20 MG tablet Take 1 tablet (20 mg total) by mouth daily. Patient not taking: Reported on 01/14/2021 09/11/20   [name], PA  LamoTRIgine 300 MG TB24 24 hour tablet Take 1 tablet (300 mg total) by mouth daily. 01/14/21   Ward Givens, NP  Multiple Vitamins-Minerals (ZINC PO) Take 1 tablet by mouth daily in the afternoon.    [provider]  Omega-3 Fatty Acids (FISH OIL) 1000 MG CAPS Take 1 capsule by mouth daily in the afternoon.    [provider]  Vitamin D, Cholecalciferol, 25 MCG (1000 UT) CAPS Take 1 capsule by mouth daily in the afternoon.    [provider]    Allergies    Patient has no  known allergies.  Review of Systems   Review of Systems  Constitutional:  Negative for chills and fever.  Eyes:  Negative for photophobia and visual disturbance.  Respiratory:  Positive for cough and shortness of breath.   Cardiovascular:  Negative for chest pain and palpitations.  Gastrointestinal:  Negative for abdominal pain, nausea and vomiting.  Genitourinary:  Negative for dysuria and hematuria.  Musculoskeletal:  Negative for arthralgias and back pain.  Skin:  Negative for color change and rash.  Neurological:  Negative for syncope and headaches.  All other systems reviewed and are negative.  Physical Exam Updated Vital Signs BP (!) 112/55   Pulse 89   Temp 98.8 F (37.1 C) (Oral)   Resp 20   Ht 5\' 6"  (1.676 m)   Wt 74.4 kg   SpO2 94%   BMI 26.47 kg/m    Physical Exam Constitutional:      General: She is not in acute distress. HENT:     Head: Normocephalic and atraumatic.  Eyes:     Conjunctiva/sclera: Conjunctivae normal.     Pupils: Pupils are equal, round, and reactive to light.  Cardiovascular:     Rate and Rhythm: Normal rate and regular rhythm.  Pulmonary:     Effort: Pulmonary effort is normal. No respiratory distress.     Comments: Diminished BS left lung field (lower) 93% on room air Abdominal:     General: There is no distension.     Tenderness: There is no abdominal tenderness.  Skin:    General: Skin is warm and dry.  Neurological:     General: No focal deficit present.     Mental Status: She is alert. Mental status is at baseline.  Psychiatric:        Mood and Affect: Mood normal.        Behavior: Behavior normal.    ED Results / Procedures / Treatments   Labs (all labs ordered are listed, but only abnormal results are displayed) Labs Reviewed  BASIC METABOLIC PANEL - Abnormal; Notable for the following components:      Result Value   Glucose, Bld 120 (*)    All other components within normal limits  CBC WITH DIFFERENTIAL/PLATELET - Abnormal; Notable for the following components:   WBC 13.1 (*)    RBC 3.72 (*)    Hemoglobin 10.2 (*)    HCT 33.0 (*)    Neutro Abs 8.2 (*)    All other components within normal limits  RESP PANEL BY RT-PCR (FLU A&B, COVID) ARPGX2    EKG None  Radiology No results found.  Procedures Procedures   Medications Ordered in ED Medications  albuterol (VENTOLIN HFA) 108 (90 Base) MCG/ACT inhaler 2 puff (2 puffs Inhalation Given 03/14/21 2221)  AeroChamber Plus Flo-Vu Medium MISC 1 each (1 each Other Given 03/14/21 2204)  iohexol (OMNIPAQUE) 350 MG/ML injection 80 mL (80 mLs Intravenous Contrast Given 03/14/21 2309)    ED Course  I have reviewed the triage vital signs and the nursing notes.  Pertinent labs & imaging results that were available during my care of the  patient were reviewed by me and considered in my medical decision making (see chart for details).  Differential diagnosis includes pneumonia versus viral infection including COVID versus pulmonary embolism versus pleural effusion versus other.  Given her sore throat and other symptoms, this still seems mostly typical for a viral infection.  However with a positive D-dimer and an oxygen level of 93%, I think a CT  PE scan is reasonable.  I personally reviewed her prior medical records including her outpatient work-up at [facility] today and her positive D-dimer results.  I personally ordered, interpreted, and reviewed her lab test as well as a CT scan of the chest, which was pending at the time of signout.  Clinical Course as of 03/14/21 2320  Sat Mar 14, 2021  2319 Signed out to Dr Tyrone Nine EDP pending f/u on CT imaging. [MT]    Clinical Course User Index [MT] Tegan Burnside, Carola Rhine, MD   Final Clinical Impression(s) / ED Diagnoses Final diagnoses:  None    Rx / DC Orders ED Discharge Orders     None        Wyvonnia Dusky, MD 03/14/21 2320

## 2021-03-14 NOTE — Discharge Instructions (Addendum)
Your CT scan did not show a pneumonia or a blood clot in your lung.  Please follow-up with your family doctor in the office.  There was a incidental finding on CT scan.  There is some concern for swollen lymph nodes inside the chest.  This is something that your family doctor will likely need to evaluate your risk and see if you need to see a different specialist aware if they would like to repeat imaging and see if anything has changed.  Take tylenol 2 pills 4 times a day.  Drink plenty of fluids.  Return for worsening shortness of breath, headache, confusion. Follow up with your family doctor.

## 2021-03-14 NOTE — ED Notes (Signed)
RT instructed pt on proper use of MDI/spacer for better deposition of medication. Pt instructed to place mask over nose/mouth exhale completely then to take in a deep breath and hold. Pt able to perform and understand process. RT will continue to monitor.

## 2021-03-14 NOTE — ED Triage Notes (Addendum)
Pt was seen at [facility] earlier today for a productive cough, SOB, and sore throat that started yesterday. They did a CXR, d-dimer, and swab. Her d-dimer came back elevated and they told her to go to an ER for a CT scan. Pt denies any pain. COVID & flu swab came back negative. D-dimer- 1,050.

## 2021-03-14 NOTE — ED Provider Notes (Signed)
I received the patient in signout from Dr. Langston Masker, briefly the patient is a 70 year old lady with shortness of breath and cough and congestion.  Went to a urgent care where a D-dimer was ordered and was positive and sent here for a CT angiogram to rule out pulmonary embolism.  Plan for CTA.  CTA is negative both for pulmonary embolism and occult pneumonia.  Incidentally the patient was found to have mediastinal adenopathy.  I had discussed this finding with her and recommended following up with her family doctor in the office for further evaluation.   Deno Etienne, DO 03/15/21 0004

## 2021-03-17 ENCOUNTER — Ambulatory Visit (INDEPENDENT_AMBULATORY_CARE_PROVIDER_SITE_OTHER): Payer: BC Managed Care – PPO | Admitting: Physician Assistant

## 2021-03-17 ENCOUNTER — Other Ambulatory Visit: Payer: Self-pay

## 2021-03-17 ENCOUNTER — Encounter: Payer: Self-pay | Admitting: Physician Assistant

## 2021-03-17 VITALS — BP 130/70 | HR 82 | Temp 98.0°F | Ht 66.0 in | Wt 161.0 lb

## 2021-03-17 DIAGNOSIS — R591 Generalized enlarged lymph nodes: Secondary | ICD-10-CM | POA: Diagnosis not present

## 2021-03-17 DIAGNOSIS — R2689 Other abnormalities of gait and mobility: Secondary | ICD-10-CM | POA: Diagnosis not present

## 2021-03-17 DIAGNOSIS — Z1211 Encounter for screening for malignant neoplasm of colon: Secondary | ICD-10-CM

## 2021-03-17 DIAGNOSIS — R710 Precipitous drop in hematocrit: Secondary | ICD-10-CM

## 2021-03-17 DIAGNOSIS — J069 Acute upper respiratory infection, unspecified: Secondary | ICD-10-CM | POA: Diagnosis not present

## 2021-03-17 DIAGNOSIS — Z23 Encounter for immunization: Secondary | ICD-10-CM | POA: Diagnosis not present

## 2021-03-17 NOTE — Progress Notes (Signed)
Katie Mcneil is a 70 y.o. female here for a ED follow up.   History of Present Illness:   Chief Complaint  Patient presents with   Follow-up    ED on 03/14/2021.    HPI  Upper Respiratory Infection On March 14, 2021, Katie Mcneil presented to the ED with c/o sore throat, cough, and SOB that began on 03/13/21. Prior to presenting to the ED, she went to [facility] urgent care where she underwent an x-ray and D-dimer test. Upon discovery of positive D-dimer result she was advised to go to the ED for further evaluation.   She has been using the albuterol 108 mcg and tessalon 100 mg a couple of times but admits that it gives her a headache. Currently her symptoms are slightly improved but she still has light SOB. She is interested in attending physical therapy.   Lymphadenopathy On CTA performed 03/14/21, she had the following findings:  CLINICAL DATA:  High probability for PE. Cough and shortness of breath.   EXAM: CT ANGIOGRAPHY CHEST WITH CONTRAST    FINDINGS: Cardiovascular: Satisfactory opacification of the pulmonary arteries to the segmental level. No evidence of pulmonary embolism. Normal heart size. No pericardial effusion.   Mediastinum/Nodes: There is an indeterminate 11 mm right thyroid nodule. There is an enlarged subcarinal lymph node measuring 13 mm short axis. There are enlarged bilateral hilar lymph nodes measuring up to 15 mm short axis. There also enlarged bilateral axillary lymph nodes. The largest lymph node on the left measures 1.8 by 3.7 cm in the largest lymph node on the right measures 1.3 by 2.0 cm. The esophagus is nondilated.   Lungs/Pleura: Lungs are clear. No pleural effusion or pneumothorax.   Upper Abdomen: There is likely adenopathy in the periaortic region image 4/136 measuring 3.7 x 1.4 cm.   Musculoskeletal: No chest wall abnormality. No acute or significant osseous findings.   Review of the MIP images confirms the above findings.    IMPRESSION: 1. No evidence for pulmonary embolism. 2. Mediastinal, hilar, bilateral axillary and upper abdominal lymphadenopathy of uncertain etiology. Findings may be related to lymphoma or metastatic disease. Recommend clinical correlation and follow-up. 3. 1.1 cm incidental right thyroid nodule. No follow-up imaging is recommended.  CBC at ER visit on 03/14/21    Component Value Date/Time   WBC 13.1 (H) 03/14/2021 2155   RBC 3.72 (L) 03/14/2021 2155   HGB 10.2 (L) 03/14/2021 2155   HGB 11.3 12/14/2018 1017   HCT 33.0 (L) 03/14/2021 2155   HCT 35.7 12/14/2018 1017   PLT PLATELET CLUMPS NOTED ON SMEAR, UNABLE TO ESTIMATE 03/14/2021 2155   PLT 289 12/14/2018 1017   MCV 88.7 03/14/2021 2155   MCV 87 12/14/2018 1017   MCH 27.4 03/14/2021 2155   MCHC 30.9 03/14/2021 2155   RDW 13.6 03/14/2021 2155   RDW 13.6 12/14/2018 1017   LYMPHSABS 3.5 03/14/2021 2155   LYMPHSABS 1.3 12/14/2018 1017   MONOABS 1.0 03/14/2021 2155   EOSABS 0.4 03/14/2021 2155   EOSABS 0.3 12/14/2018 1017   BASOSABS 0.1 03/14/2021 2155   BASOSABS 0.0 12/14/2018 1017     Denies rectal bleeding, unintentional weight loss, fever, night sweats, or abdominal pain. She reports that she has had concerns about swelling in cervical LN in the past but was never worked-up by prior providers. Sister was dx with chronic leukemia around age 50.  Patient is due for colonoscopy 05/2021. She has appointment for mammogram in January.   Gait and balance Katie Mcneil  has become concerned when it came to her gait and balance. On her prior visit with Dr. Clabe Seal, 01/14/21, she felt as though she was struggling during that time. Denies any changes in vision, weakness, slurred speech.   Past Medical History:  Diagnosis Date   Dyslipidemia    Seizures ([facility])      Social History   Tobacco Use   Smoking status: Never   Smokeless tobacco: Never  Vaping Use   Vaping Use: Never used  Substance Use Topics   Alcohol use: Not  Currently    Alcohol/week: 0.0 standard drinks   Drug use: No    Past Surgical History:  Procedure Laterality Date   CATARACT EXTRACTION W/ INTRAOCULAR LENS IMPLANT Bilateral 2019   CESAREAN SECTION  1981, 1990   NO PAST SURGERIES     TUBAL LIGATION      Family History  Problem Relation Age of Onset   Anuerysm Father    Neuropathy Mother    Seizures Daughter    Other Daughter        Psychological disorder   Leukemia Sister    Alzheimer's disease Sister 16   Dementia Brother    Heart attack Maternal Grandmother    CVA Maternal Grandfather    Dementia Paternal Grandmother    Colon cancer Neg Hx     No Known Allergies  Current Medications:   Current Outpatient Medications:    albuterol (VENTOLIN HFA) 108 (90 Base) MCG/ACT inhaler, PLEASE SEE ATTACHED FOR DETAILED DIRECTIONS, Disp: , Rfl:    atorvastatin (LIPITOR) 20 MG tablet, Take 1 tablet (20 mg total) by mouth daily. (Patient not taking: No sig reported), Disp: 90 tablet, Rfl: 0   benzonatate (TESSALON) 100 MG capsule, Take 200 mg by mouth 3 (three) times daily as needed., Disp: , Rfl:    LamoTRIgine 300 MG TB24 24 hour tablet, Take 1 tablet (300 mg total) by mouth daily., Disp: 90 tablet, Rfl: 3   Omega-3 Fatty Acids (FISH OIL) 1000 MG CAPS, Take 1 capsule by mouth daily in the afternoon., Disp: , Rfl:    Vitamin D, Cholecalciferol, 25 MCG (1000 UT) CAPS, Take 1 capsule by mouth daily in the afternoon., Disp: , Rfl:    Review of Systems:   ROS Negative unless otherwise specified per HPI.  Vitals:   Vitals:   03/17/21 1304  BP: 130/70  Pulse: 82  Temp: 98 F (36.7 C)  TempSrc: Temporal  SpO2: 99%  Weight: 161 lb (73 kg)  Height: 5\' 6"  (1.676 m)     Body mass index is 25.99 kg/m.  Physical Exam:   Physical Exam Vitals and nursing note reviewed.  Constitutional:      General: She is not in acute distress.    Appearance: She is well-developed. She is not ill-appearing or toxic-appearing.   Cardiovascular:     Rate and Rhythm: Normal rate and regular rhythm.     Pulses: Normal pulses.     Heart sounds: Normal heart sounds, S1 normal and S2 normal.  Pulmonary:     Effort: Pulmonary effort is normal.     Breath sounds: Normal breath sounds.  Skin:    General: Skin is warm and dry.  Neurological:     General: No focal deficit present.     Mental Status: She is alert.     GCS: GCS eye subscore is 4. GCS verbal subscore is 5. GCS motor subscore is 6.     Cranial Nerves: Cranial nerves are intact.  Sensory: Sensation is intact.     Motor: Motor function is intact. No weakness.     Coordination: Coordination is intact.  Psychiatric:        Speech: Speech normal.        Behavior: Behavior normal. Behavior is cooperative.    Assessment and Plan:   Balance problem Referral for PT She is also considering getting second opinion from neurology Asked her to continue to monitor her symptoms and let us know if she has any new/worrisome sx that would require more urgent evaluation Neuro exam benign  Lymphadenopathy Due to incidental findings will urgently refer to surgical oncology for further evaluation  Decreased hemoglobin Repeat Hgb in 1 week Update colonoscopy -- referral ordered  Upper respiratory tract infection, unspecified type Lungs CTA Continue inhaler and supportive care If worsening sx, recommend letting us know  I,Havlyn C Ratchford,acting as a scribe for [employer], PA.,have documented all relevant documentation on the behalf of [name], PA,as directed by  [name], PA while in the presence of [name], [place].  I, [name], [place], have reviewed all documentation for this visit. The documentation on 03/17/21 for the exam, diagnosis, procedures, and orders are all accurate and complete.   [name], PA-C

## 2021-03-17 NOTE — Patient Instructions (Signed)
It was great to see you!  Referral placed today for Physical Therapy with Lauren here at our office. Please schedule your appointment at your convenience.   Please get your mammogram as scheduled.  You will be contacted about scheduling your colonoscopy with [facility].  Please follow-up with [place] in [place] -- so we can recheck your hemoglobin and your white blood cell count.  Take care,  [name] PA-C

## 2021-03-20 ENCOUNTER — Encounter: Payer: Self-pay | Admitting: Gastroenterology

## 2021-03-20 DIAGNOSIS — R591 Generalized enlarged lymph nodes: Secondary | ICD-10-CM | POA: Diagnosis not present

## 2021-03-23 ENCOUNTER — Encounter (HOSPITAL_BASED_OUTPATIENT_CLINIC_OR_DEPARTMENT_OTHER): Payer: Self-pay | Admitting: Obstetrics and Gynecology

## 2021-03-23 ENCOUNTER — Inpatient Hospital Stay (HOSPITAL_BASED_OUTPATIENT_CLINIC_OR_DEPARTMENT_OTHER)
Admission: EM | Admit: 2021-03-23 | Discharge: 2021-03-28 | DRG: 988 | Disposition: A | Discharge status: Home / Self Care | Payer: BC Managed Care – PPO | Attending: Internal Medicine | Admitting: Internal Medicine

## 2021-03-23 ENCOUNTER — Encounter: Payer: Self-pay | Admitting: Physician Assistant

## 2021-03-23 ENCOUNTER — Emergency Department (HOSPITAL_BASED_OUTPATIENT_CLINIC_OR_DEPARTMENT_OTHER): Payer: BC Managed Care – PPO

## 2021-03-23 ENCOUNTER — Ambulatory Visit (INDEPENDENT_AMBULATORY_CARE_PROVIDER_SITE_OTHER): Payer: BC Managed Care – PPO | Admitting: Physician Assistant

## 2021-03-23 ENCOUNTER — Other Ambulatory Visit: Payer: Self-pay

## 2021-03-23 ENCOUNTER — Emergency Department (HOSPITAL_BASED_OUTPATIENT_CLINIC_OR_DEPARTMENT_OTHER): Payer: BC Managed Care – PPO | Admitting: Radiology

## 2021-03-23 VITALS — BP 118/64 | HR 88 | Temp 99.9°F | Ht 66.0 in | Wt 159.5 lb

## 2021-03-23 DIAGNOSIS — Z9842 Cataract extraction status, left eye: Secondary | ICD-10-CM | POA: Diagnosis not present

## 2021-03-23 DIAGNOSIS — Z9851 Tubal ligation status: Secondary | ICD-10-CM

## 2021-03-23 DIAGNOSIS — D508 Other iron deficiency anemias: Secondary | ICD-10-CM | POA: Diagnosis not present

## 2021-03-23 DIAGNOSIS — Z9841 Cataract extraction status, right eye: Secondary | ICD-10-CM

## 2021-03-23 DIAGNOSIS — E785 Hyperlipidemia, unspecified: Secondary | ICD-10-CM | POA: Diagnosis not present

## 2021-03-23 DIAGNOSIS — R591 Generalized enlarged lymph nodes: Secondary | ICD-10-CM | POA: Diagnosis present

## 2021-03-23 DIAGNOSIS — R599 Enlarged lymph nodes, unspecified: Secondary | ICD-10-CM | POA: Diagnosis not present

## 2021-03-23 DIAGNOSIS — R5381 Other malaise: Secondary | ICD-10-CM | POA: Diagnosis not present

## 2021-03-23 DIAGNOSIS — Z961 Presence of intraocular lens: Secondary | ICD-10-CM | POA: Diagnosis not present

## 2021-03-23 DIAGNOSIS — G40909 Epilepsy, unspecified, not intractable, without status epilepticus: Secondary | ICD-10-CM | POA: Diagnosis present

## 2021-03-23 DIAGNOSIS — R61 Generalized hyperhidrosis: Secondary | ICD-10-CM | POA: Diagnosis not present

## 2021-03-23 DIAGNOSIS — Z20822 Contact with and (suspected) exposure to covid-19: Secondary | ICD-10-CM | POA: Diagnosis present

## 2021-03-23 DIAGNOSIS — R059 Cough, unspecified: Secondary | ICD-10-CM | POA: Diagnosis not present

## 2021-03-23 DIAGNOSIS — R87619 Unspecified abnormal cytological findings in specimens from cervix uteri: Secondary | ICD-10-CM | POA: Insufficient documentation

## 2021-03-23 DIAGNOSIS — A419 Sepsis, unspecified organism: Secondary | ICD-10-CM | POA: Diagnosis not present

## 2021-03-23 DIAGNOSIS — R509 Fever, unspecified: Secondary | ICD-10-CM | POA: Diagnosis not present

## 2021-03-23 DIAGNOSIS — R590 Localized enlarged lymph nodes: Secondary | ICD-10-CM | POA: Diagnosis present

## 2021-03-23 DIAGNOSIS — R0682 Tachypnea, not elsewhere classified: Secondary | ICD-10-CM | POA: Diagnosis present

## 2021-03-23 DIAGNOSIS — R609 Edema, unspecified: Secondary | ICD-10-CM | POA: Diagnosis not present

## 2021-03-23 DIAGNOSIS — D259 Leiomyoma of uterus, unspecified: Secondary | ICD-10-CM | POA: Diagnosis not present

## 2021-03-23 DIAGNOSIS — R0602 Shortness of breath: Secondary | ICD-10-CM

## 2021-03-23 DIAGNOSIS — D72110 Idiopathic hypereosinophilic syndrome (ihes): Secondary | ICD-10-CM | POA: Diagnosis not present

## 2021-03-23 DIAGNOSIS — J189 Pneumonia, unspecified organism: Secondary | ICD-10-CM | POA: Diagnosis not present

## 2021-03-23 DIAGNOSIS — C8595 Non-Hodgkin lymphoma, unspecified, lymph nodes of inguinal region and lower limb: Secondary | ICD-10-CM | POA: Diagnosis present

## 2021-03-23 DIAGNOSIS — D72829 Elevated white blood cell count, unspecified: Secondary | ICD-10-CM | POA: Diagnosis present

## 2021-03-23 DIAGNOSIS — Z79899 Other long term (current) drug therapy: Secondary | ICD-10-CM

## 2021-03-23 DIAGNOSIS — J90 Pleural effusion, not elsewhere classified: Secondary | ICD-10-CM | POA: Diagnosis not present

## 2021-03-23 DIAGNOSIS — D649 Anemia, unspecified: Secondary | ICD-10-CM | POA: Diagnosis present

## 2021-03-23 DIAGNOSIS — R569 Unspecified convulsions: Secondary | ICD-10-CM

## 2021-03-23 DIAGNOSIS — I700 Atherosclerosis of aorta: Secondary | ICD-10-CM | POA: Diagnosis not present

## 2021-03-23 LAB — TROPONIN I (HIGH SENSITIVITY)
Troponin I (High Sensitivity): 3 ng/L (ref <18)
Troponin I (High Sensitivity): 4 ng/L (ref <18)

## 2021-03-23 LAB — COMPREHENSIVE METABOLIC PANEL
ALT: 39 U/L (ref 0–44)
AST: 31 U/L (ref 15–41)
Albumin: 4 g/dL (ref 3.5–5.0)
Alkaline Phosphatase: 123 U/L (ref 38–126)
Anion gap: 12 (ref 5–15)
BUN: 9 mg/dL (ref 8–23)
CO2: 23 mmol/L (ref 22–32)
Calcium: 9.6 mg/dL (ref 8.9–10.3)
Chloride: 102 mmol/L (ref 98–111)
Creatinine, Ser: 0.64 mg/dL (ref 0.44–1.00)
GFR, Estimated: >60 mL/min (ref >60)
Glucose, Bld: 96 mg/dL (ref 70–99)
Potassium: 3.7 mmol/L (ref 3.5–5.1)
Sodium: 137 mmol/L (ref 135–145)
Total Bilirubin: 0.5 mg/dL (ref 0.3–1.2)
Total Protein: 7.9 g/dL (ref 6.5–8.1)

## 2021-03-23 LAB — CBC WITH DIFFERENTIAL/PLATELET
Abs Immature Granulocytes: 0.12 10*3/uL — ABNORMAL HIGH (ref 0.00–0.07)
Basophils Absolute: 0.1 10*3/uL (ref 0.0–0.1)
Basophils Relative: 0 %
Eosinophils Absolute: 0.4 10*3/uL (ref 0.0–0.5)
Eosinophils Relative: 2 %
HCT: 33 % — ABNORMAL LOW (ref 36.0–46.0)
Hemoglobin: 10.4 g/dL — ABNORMAL LOW (ref 12.0–15.0)
Immature Granulocytes: 1 %
Lymphocytes Relative: 17 %
Lymphs Abs: 3 10*3/uL (ref 0.7–4.0)
MCH: 27.7 pg (ref 26.0–34.0)
MCHC: 31.5 g/dL (ref 30.0–36.0)
MCV: 88 fL (ref 80.0–100.0)
Monocytes Absolute: 1.6 10*3/uL — ABNORMAL HIGH (ref 0.1–1.0)
Monocytes Relative: 9 %
Neutro Abs: 12.4 10*3/uL — ABNORMAL HIGH (ref 1.7–7.7)
Neutrophils Relative %: 71 %
Platelets: 387 10*3/uL (ref 150–400)
RBC: 3.75 MIL/uL — ABNORMAL LOW (ref 3.87–5.11)
RDW: 13.4 % (ref 11.5–15.5)
WBC: 17.4 10*3/uL — ABNORMAL HIGH (ref 4.0–10.5)
nRBC: 0 % (ref 0.0–0.2)

## 2021-03-23 LAB — LACTIC ACID, PLASMA
Lactic Acid, Venous: 0.9 mmol/L (ref 0.5–1.9)
Lactic Acid, Venous: 0.5 mmol/L (ref 0.5–1.9)

## 2021-03-23 LAB — LIPASE, BLOOD: Lipase: <10 U/L — ABNORMAL LOW (ref 11–51)

## 2021-03-23 LAB — PROTIME-INR
INR: 1.1 (ref 0.8–1.2)
Prothrombin Time: 14 seconds (ref 11.4–15.2)

## 2021-03-23 LAB — RESP PANEL BY RT-PCR (FLU A&B, COVID) ARPGX2
Influenza A by PCR: NEGATIVE
Influenza B by PCR: NEGATIVE
SARS Coronavirus 2 by RT PCR: NEGATIVE

## 2021-03-23 LAB — BRAIN NATRIURETIC PEPTIDE: B Natriuretic Peptide: 75.3 pg/mL (ref 0.0–100.0)

## 2021-03-23 LAB — APTT: aPTT: 37 seconds — ABNORMAL HIGH (ref 24–36)

## 2021-03-23 MED ORDER — LACTATED RINGERS IV SOLN: INTRAVENOUS | Status: DC

## 2021-03-23 MED ORDER — SODIUM CHLORIDE 0.9 % IV BOLUS
1000.0000 mL | Freq: Once | INTRAVENOUS | Status: AC
  Administered 2021-03-23: 1000 mL via INTRAVENOUS

## 2021-03-23 MED ORDER — CEFTRIAXONE SODIUM 2 G IJ SOLR
2.0000 g | INTRAMUSCULAR | Status: AC
  Administered 2021-03-23 – 2021-03-27 (×5): 2 g via INTRAVENOUS
  Filled 2021-03-23 (×6): qty 20

## 2021-03-23 MED ORDER — IOHEXOL 350 MG/ML SOLN
75.0000 mL | Freq: Once | INTRAVENOUS | Status: AC | PRN
  Administered 2021-03-23: 75 mL via INTRAVENOUS

## 2021-03-23 MED ORDER — ALBUTEROL SULFATE HFA 108 (90 BASE) MCG/ACT IN AERS
2.0000 | INHALATION_SPRAY | RESPIRATORY_TRACT | Status: DC | PRN
  Administered 2021-03-23: 2 via RESPIRATORY_TRACT
  Filled 2021-03-23: qty 6.7

## 2021-03-23 MED ORDER — AZITHROMYCIN 500 MG IV SOLR
500.0000 mg | INTRAVENOUS | Status: DC
Start: 2021-03-23 — End: 2021-03-25
  Administered 2021-03-23 – 2021-03-24 (×2): 500 mg via INTRAVENOUS
  Filled 2021-03-23 (×3): qty 500

## 2021-03-23 MED ORDER — SODIUM CHLORIDE 0.9 % IV SOLN
INTRAVENOUS | Status: DC | PRN
  Administered 2021-03-23: 250 mL via INTRAVENOUS

## 2021-03-23 NOTE — Progress Notes (Signed)
Katie Mcneil is a 70 y.o. female here for a follow up of a pre-existing problem.  I acted as a Education administrator for [employer], PA-C [employer], LPN   History of Present Illness:   Chief Complaint  Patient presents with   Cough    HPI  Cough Pt c/o cough x 10 days, coughing and expectorating green sputum.  She was last seen by me on 03/17/2021, 6 days ago, and appeared clinically stable.  She did not endorse shortness of breath at that time.  She is here today with her daughter, they walked into our office today.  She states that she is having some increased shortness of breath and ongoing fatigue.  She is having work-up from surgical oncology due to recent CT findings of lymphadenopathy.  She has a PET scan scheduled soon.  She has been using Mucinex, Albuterol Inhaler and Tylenol.  She is feeling very short of breath with activity and very rundown.  Wt Readings from Last 3 Encounters:  03/23/21 159 lb 8 oz (72.3 kg)  03/17/21 161 lb (73 kg)  03/14/21 164 lb (74.4 kg)       Past Medical History:  Diagnosis Date   Dyslipidemia    Seizures ([facility])      Social History   Tobacco Use   Smoking status: Never   Smokeless tobacco: Never  Vaping Use   Vaping Use: Never used  Substance Use Topics   Alcohol use: Not Currently    Alcohol/week: 0.0 standard drinks   Drug use: No    Past Surgical History:  Procedure Laterality Date   CATARACT EXTRACTION W/ INTRAOCULAR LENS IMPLANT Bilateral 2019   CESAREAN SECTION  1981, 1990   NO PAST SURGERIES     TUBAL LIGATION      Family History  Problem Relation Age of Onset   Neuropathy Mother    Anuerysm Father    Leukemia Sister 11   Alzheimer's disease Sister 88   Dementia Brother    Heart attack Maternal Grandmother    CVA Maternal Grandfather    Dementia Paternal Grandmother    Seizures Daughter    Other Daughter        Psychological disorder   Colon cancer Neg Hx     No Known Allergies  Current Medications:    Current Outpatient Medications:    albuterol (VENTOLIN HFA) 108 (90 Base) MCG/ACT inhaler, PLEASE SEE ATTACHED FOR DETAILED DIRECTIONS, Disp: , Rfl:    atorvastatin (LIPITOR) 20 MG tablet, Take 1 tablet (20 mg total) by mouth daily. (Patient not taking: No sig reported), Disp: 90 tablet, Rfl: 0   LamoTRIgine 300 MG TB24 24 hour tablet, Take 1 tablet (300 mg total) by mouth daily., Disp: 90 tablet, Rfl: 3   Omega-3 Fatty Acids (FISH OIL) 1000 MG CAPS, Take 1 capsule by mouth daily in the afternoon., Disp: , Rfl:    Vitamin D, Cholecalciferol, 25 MCG (1000 UT) CAPS, Take 1 capsule by mouth daily in the afternoon., Disp: , Rfl:    benzonatate (TESSALON) 100 MG capsule, Take 200 mg by mouth 3 (three) times daily as needed. (Patient not taking: Reported on 03/23/2021), Disp: , Rfl:    Review of Systems:   ROS Negative unless otherwise specified per HPI.   Vitals:   Vitals:   03/23/21 1432  BP: 118/64  Pulse: 88  Temp: 99.9 F (37.7 C)  TempSrc: Temporal  SpO2: 93%  Weight: 159 lb 8 oz (72.3 kg)  Height: 5\' 6"  (1.676  m)     Body mass index is 25.74 kg/m.  Physical Exam:   Physical Exam Vitals and nursing note reviewed.  Constitutional:      General: She is not in acute distress.    Appearance: She is well-developed. She is ill-appearing and diaphoretic. She is not toxic-appearing.  Cardiovascular:     Rate and Rhythm: Normal rate and regular rhythm.     Pulses: Normal pulses.     Heart sounds: Normal heart sounds, S1 normal and S2 normal.  Pulmonary:     Effort: Pulmonary effort is normal.     Breath sounds: Examination of the right-lower field reveals decreased breath sounds. Examination of the left-lower field reveals decreased breath sounds. Decreased breath sounds present.  Skin:    General: Skin is warm.  Neurological:     Mental Status: She is alert.     GCS: GCS eye subscore is 4. GCS verbal subscore is 5. GCS motor subscore is 6.  Psychiatric:        Speech:  Speech normal.        Behavior: Behavior normal. Behavior is cooperative.    Assessment and Plan:   Shortness of breath; Mal[place] She does not appear well; looks significantly worse than when I saw her a week ago Given her recent findings on her CT scan, decrease in hemoglobin, lower oxygen level than baseline, ongoing weight loss, worsening fatigue, will have patient seen in the emergency setting Follow-up based on ER visit  CMA or LPN served as scribe during this visit. History, Physical, and Plan performed by medical provider. The above documentation has been reviewed and is accurate and complete.   [name], PA-C

## 2021-03-23 NOTE — ED Triage Notes (Signed)
Patient presents to the ER for cough and ShOB. Patient reports she started feeling sick x 10 days ago and was negative for COVID and Flu when seen.

## 2021-03-23 NOTE — Sepsis Progress Note (Signed)
[facility] is monitoring this code sepsis 

## 2021-03-23 NOTE — ED Provider Notes (Signed)
[facility] EMERGENCY DEPT Provider Note   CSN: [id number] Arrival date & time: 03/23/21  1503     History Chief Complaint  Patient presents with   Cough    Katie Mcneil is a 70 y.o. female.  The history is provided by the patient.  Cough Cough characteristics:  Productive Sputum characteristics:  Nondescript Severity:  Moderate Onset quality:  Gradual Duration:  5 days Timing:  Constant Progression:  Worsening Chronicity:  New Context: upper respiratory infection   Relieved by:  Nothing Worsened by:  Nothing Associated symptoms: chills, fever and shortness of breath   Associated symptoms: no chest pain, no ear pain, no rash and no sore throat       Past Medical History:  Diagnosis Date   Dyslipidemia    Seizures ([place])     Patient Active Problem List   Diagnosis Date Noted   Pneumonia 03/23/2021   Iron deficiency anemia 10/10/2020   Osteopenia 10/01/2020   Encounter for long-term (current) use of other medications 11/01/2013   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     Family History  Problem Relation Age of Onset   Neuropathy Mother    Anuerysm Father    Leukemia Sister 36   Alzheimer's disease Sister 45   Dementia Brother    Heart attack Maternal Grandmother    CVA Maternal Grandfather    Dementia Paternal Grandmother    Seizures Daughter    Other Daughter        Psychological disorder   Colon cancer Neg Hx     Social History   Tobacco Use   Smoking status: Never   Smokeless tobacco: Never  Vaping Use   Vaping Use: Never used  Substance Use Topics   Alcohol use: Not Currently    Alcohol/week: 0.0 standard drinks   Drug use: No    Home Medications Prior to Admission  medications   Medication Sig Start Date End Date Taking? Authorizing Provider  atorvastatin (LIPITOR) 20 MG tablet Take 1 tablet (20 mg total) by mouth daily. Patient not taking: No sig reported 09/11/20   [name], PA  albuterol (VENTOLIN HFA) 108 (90 Base) MCG/ACT inhaler PLEASE SEE ATTACHED FOR DETAILED DIRECTIONS 03/14/21   [provider]  benzonatate (TESSALON) 100 MG capsule Take 200 mg by mouth 3 (three) times daily as needed. Patient not taking: Reported on 03/23/2021 03/14/21   [provider]  LamoTRIgine 300 MG TB24 24 hour tablet Take 1 tablet (300 mg total) by mouth daily. 01/14/21   Ward Givens, NP  Omega-3 Fatty Acids (FISH OIL) 1000 MG CAPS Take 1 capsule by mouth daily in the afternoon.    [provider]  Vitamin D, Cholecalciferol, 25 MCG (1000 UT) CAPS Take 1 capsule by mouth daily in the afternoon.    [provider]    Allergies    Patient has no known allergies.  Review of Systems   Review of Systems  Constitutional:  Positive for chills and fever.  HENT:  Negative for ear pain and sore throat.   Eyes:  Negative for pain and visual disturbance.  Respiratory:  Positive for cough and shortness of breath.   Cardiovascular:  Negative  for chest pain and palpitations.  Gastrointestinal:  Negative for abdominal pain and vomiting.  Genitourinary:  Negative for dysuria and hematuria.  Musculoskeletal:  Negative for arthralgias and back pain.  Skin:  Negative for color change and rash.  Neurological:  Negative for seizures and syncope.  All other systems reviewed and are negative.  Physical Exam Updated Vital Signs BP (!) 116/59   Pulse 100   Temp (!) 101.2 F (38.4 C) (Rectal)   Resp 20   Ht 5\' 6"  (1.676 m)   Wt 72.6 kg   SpO2 95%   BMI 25.82 kg/m   Physical Exam Vitals and nursing note reviewed.  Constitutional:      General: She is not in acute distress.    Appearance: She is well-developed.  HENT:     Head:  Normocephalic and atraumatic.     Nose: Nose normal.     Mouth/Throat:     Mouth: Mucous membranes are moist.  Eyes:     Extraocular Movements: Extraocular movements intact.     Conjunctiva/sclera: Conjunctivae normal.     Pupils: Pupils are equal, round, and reactive to light.  Cardiovascular:     Rate and Rhythm: Normal rate and regular rhythm.     Pulses: Normal pulses.     Heart sounds: Normal heart sounds. No murmur heard. Pulmonary:     Breath sounds: Normal breath sounds.     Comments: Coarse breath sounds  Abdominal:     Palpations: Abdomen is soft.     Tenderness: There is no abdominal tenderness.  Musculoskeletal:     Cervical back: Neck supple.  Skin:    General: Skin is warm and dry.     Capillary Refill: Capillary refill takes less than 2 seconds.  Neurological:     General: No focal deficit present.     Mental Status: She is alert.  Psychiatric:        Mood and Affect: Mood normal.    ED Results / Procedures / Treatments   Labs (all labs ordered are listed, but only abnormal results are displayed) Labs Reviewed  CBC WITH DIFFERENTIAL/PLATELET - Abnormal; Notable for the following components:      Result Value   WBC 17.4 (*)    RBC 3.75 (*)    Hemoglobin 10.4 (*)    HCT 33.0 (*)    Neutro Abs 12.4 (*)    Monocytes Absolute 1.6 (*)    Abs Immature Granulocytes 0.12 (*)    All other components within normal limits  LIPASE, BLOOD - Abnormal; Notable for the following components:   Lipase <10 (*)    All other components within normal limits  APTT - Abnormal; Notable for the following components:   aPTT 37 (*)    All other components within normal limits  RESP PANEL BY RT-PCR (FLU A&B, COVID) ARPGX2  CULTURE, BLOOD (ROUTINE X 2)  CULTURE, BLOOD (ROUTINE X 2)  URINE CULTURE  COMPREHENSIVE METABOLIC PANEL  BRAIN NATRIURETIC PEPTIDE  LACTIC ACID, PLASMA  LACTIC ACID, PLASMA  PROTIME-INR  URINALYSIS, ROUTINE W REFLEX MICROSCOPIC  TROPONIN I (HIGH  SENSITIVITY)  TROPONIN I (HIGH SENSITIVITY)    EKG EKG Interpretation  Date/Time:  Monday March 23 2021 15:29:10 EDT Ventricular Rate:  90 PR Interval:  132 QRS Duration: 72 QT Interval:  344 QTC Calculation: 420 R Axis:   49 Text Interpretation: Normal sinus rhythm Nonspecific T wave abnormality Abnormal ECG Confirmed by Lennice Sites ([id number]) on 03/23/2021 4:15:44 PM  Radiology DG Chest  2 View  Result Date: 03/23/2021 CLINICAL DATA:  sob EXAM: CHEST - 2 VIEW COMPARISON:  Chest radiograph December 20, 2014. chest CT March 14, 2021. FINDINGS: Patchy opacities in the left lung and right upper lobe. No visible pleural effusions or pneumothorax. Cardiomediastinal silhouette is within normal limits. No acute osseous abnormality. Mild reverse S-shaped thoracolumbar curvature. IMPRESSION: Patchy opacities in the left lung and right upper lobe, concerning for multifocal pneumonia. Recommend imaging follow-up to ensure resolution. Electronically Signed   By: Margaretha Sheffield M.D.   On: 03/23/2021 16:04   CT Angio Chest PE W and/or Wo Contrast  Result Date: 03/23/2021 CLINICAL DATA:  Fever, cough, shortness of breath EXAM: CT ANGIOGRAPHY CHEST WITH CONTRAST TECHNIQUE: Multidetector CT imaging of the chest was performed using the standard protocol during bolus administration of intravenous contrast. Multiplanar CT image reconstructions and MIPs were obtained to evaluate the vascular anatomy. CONTRAST:  17mL OMNIPAQUE IOHEXOL 350 MG/ML SOLN COMPARISON:  03/14/2021 FINDINGS: Cardiovascular: No filling defects in the pulmonary arteries to suggest pulmonary emboli. Heart is normal size. Aorta is normal caliber. Scattered coronary artery calcifications and aortic calcifications. Mediastinum/Nodes: There is mediastinal, bilateral hilar and bilateral axillary adenopathy again noted, stable since prior study. Trachea and esophagus are unremarkable. Lungs/Pleura: New bilateral ground-glass airspace opacities  noted with tree-in-bud nodular densities also seen bilaterally. Findings are more pronounced on the left. Findings likely reflect multifocal pneumonia. Atypical/viral pneumonia possible. Small left pleural effusion. Upper Abdomen: Upper abdominal/retroperitoneal adenopathy again noted, unchanged since prior study. Musculoskeletal: Chest wall soft tissues are unremarkable. No acute bony abnormality. Review of the MIP images confirms the above findings. IMPRESSION: Mediastinal, bilateral hilar and bilateral axillary as well as upper abdominal adenopathy, stable since prior study. Findings are concerning for lymphoma or metastatic disease. Recommend clinical correlation. New bilateral airspace disease, left greater than right concerning for multifocal pneumonia. Small left pleural effusion. No evidence of pulmonary embolus. Coronary artery disease. Aortic Atherosclerosis (ICD10-I70.0). Electronically Signed   By: Rolm Baptise M.D.   On: 03/23/2021 18:37    Procedures .Critical Care Performed by: Lennice Sites, DO Authorized by: Lennice Sites, DO   Critical care provider statement:    Critical care time (minutes):  35   Critical care was necessary to treat or prevent imminent or life-threatening deterioration of the following conditions:  Sepsis   Critical care was time spent personally by me on the following activities:  Blood draw for specimens, development of treatment plan with patient or surrogate, evaluation of patient's response to treatment, discussions with primary provider, examination of patient, obtaining history from patient or surrogate, ordering and performing treatments and interventions, ordering and review of laboratory studies, ordering and review of radiographic studies, pulse oximetry, re-evaluation of patient's condition and review of old charts   I assumed direction of critical care for this patient from another provider in my specialty: no     Care discussed with: admitting provider      Medications Ordered in ED Medications  albuterol (VENTOLIN HFA) 108 (90 Base) MCG/ACT inhaler 2 puff (2 puffs Inhalation Given 03/23/21 1541)  cefTRIAXone (ROCEPHIN) 2 g in sodium chloride 0.9 % 100 mL IVPB (0 g Intravenous Stopped 03/23/21 1828)  azithromycin (ZITHROMAX) 500 mg in sodium chloride 0.9 % 250 mL IVPB (500 mg Intravenous New Bag/Given 03/23/21 1832)  0.9 %  sodium chloride infusion (250 mLs Intravenous New Bag/Given 03/23/21 1733)  sodium chloride 0.9 % bolus 1,000 mL (0 mLs Intravenous Stopped 03/23/21 1901)  iohexol (OMNIPAQUE) 350 MG/ML  injection 75 mL (75 mLs Intravenous Contrast Given 03/23/21 1821)    ED Course  I have reviewed the triage vital signs and the nursing notes.  Pertinent labs & imaging results that were available during my care of the patient were reviewed by me and considered in my medical decision making (see chart for details).    MDM Rules/Calculators/A&P                           Katie Mcneil is here for cough, shortness of breath.  Found to be febrile and tachycardic.  Sepsis order set initiated.  Had recent CT scan of her chest that showed some mediastinal lymphadenopathy and currently undergoing work-up to possibly diagnose cancerous process.  No smoking history.  She has been having fever and chills the last 4 days with cough and sputum production.  Chest x-ray concerning for infectious process.  White count of 17.4.  COVID and flu test are negative.  Given fever and tachycardia and leukocytosis broad-spectrum IV antibiotics started to cover for pneumonia.  We will get you CT scan to further evaluate but will admit for sepsis care.  Lab work otherwise unremarkable.  EKG shows sinus rhythm.  We will give IV fluids.  She denies any urinary symptoms.  Lactic acid is pending.  This could be oncology process as well.  CT scan consistent with multifocal pneumonia as well.  Continued mediastinal and hilar and axillary and upper abdominal adenopathy.   Findings are concerning for lymphoma or metastatic disease.  This is already being worked up.  Admitted to medicine for further sepsis care.  This chart was dictated using voice recognition software.  Despite best efforts to proofread,  errors can occur which can change the documentation meaning.   Final Clinical Impression(s) / ED Diagnoses Final diagnoses:  Sepsis, due to unspecified organism, unspecified whether acute organ dysfunction present [facility])  Community acquired pneumonia, unspecified laterality    Rx / DC Orders ED Discharge Orders     None        Lennice Sites, DO 03/23/21 2321

## 2021-03-24 ENCOUNTER — Other Ambulatory Visit: Payer: BC Managed Care – PPO

## 2021-03-24 DIAGNOSIS — Z9851 Tubal ligation status: Secondary | ICD-10-CM | POA: Diagnosis not present

## 2021-03-24 DIAGNOSIS — C8595 Non-Hodgkin lymphoma, unspecified, lymph nodes of inguinal region and lower limb: Secondary | ICD-10-CM | POA: Diagnosis present

## 2021-03-24 DIAGNOSIS — D72110 Idiopathic hypereosinophilic syndrome (ihes): Secondary | ICD-10-CM | POA: Diagnosis not present

## 2021-03-24 DIAGNOSIS — J189 Pneumonia, unspecified organism: Secondary | ICD-10-CM | POA: Diagnosis present

## 2021-03-24 DIAGNOSIS — R591 Generalized enlarged lymph nodes: Secondary | ICD-10-CM | POA: Diagnosis present

## 2021-03-24 DIAGNOSIS — Z9841 Cataract extraction status, right eye: Secondary | ICD-10-CM | POA: Diagnosis not present

## 2021-03-24 DIAGNOSIS — D649 Anemia, unspecified: Secondary | ICD-10-CM | POA: Diagnosis present

## 2021-03-24 DIAGNOSIS — R61 Generalized hyperhidrosis: Secondary | ICD-10-CM | POA: Diagnosis present

## 2021-03-24 DIAGNOSIS — R509 Fever, unspecified: Secondary | ICD-10-CM | POA: Diagnosis present

## 2021-03-24 DIAGNOSIS — R0682 Tachypnea, not elsewhere classified: Secondary | ICD-10-CM | POA: Diagnosis present

## 2021-03-24 DIAGNOSIS — D72829 Elevated white blood cell count, unspecified: Secondary | ICD-10-CM | POA: Diagnosis present

## 2021-03-24 DIAGNOSIS — R569 Unspecified convulsions: Secondary | ICD-10-CM

## 2021-03-24 DIAGNOSIS — E785 Hyperlipidemia, unspecified: Secondary | ICD-10-CM | POA: Diagnosis present

## 2021-03-24 DIAGNOSIS — Z79899 Other long term (current) drug therapy: Secondary | ICD-10-CM | POA: Diagnosis not present

## 2021-03-24 DIAGNOSIS — Z20822 Contact with and (suspected) exposure to covid-19: Secondary | ICD-10-CM | POA: Diagnosis present

## 2021-03-24 DIAGNOSIS — Z961 Presence of intraocular lens: Secondary | ICD-10-CM | POA: Diagnosis present

## 2021-03-24 DIAGNOSIS — D508 Other iron deficiency anemias: Secondary | ICD-10-CM | POA: Diagnosis not present

## 2021-03-24 DIAGNOSIS — Z9842 Cataract extraction status, left eye: Secondary | ICD-10-CM | POA: Diagnosis not present

## 2021-03-24 DIAGNOSIS — R590 Localized enlarged lymph nodes: Secondary | ICD-10-CM | POA: Diagnosis present

## 2021-03-24 DIAGNOSIS — G40909 Epilepsy, unspecified, not intractable, without status epilepticus: Secondary | ICD-10-CM | POA: Diagnosis present

## 2021-03-24 DIAGNOSIS — R609 Edema, unspecified: Secondary | ICD-10-CM | POA: Diagnosis not present

## 2021-03-24 LAB — TSH: TSH: 1.721 u[IU]/mL (ref 0.350–4.500)

## 2021-03-24 LAB — PROCALCITONIN: Procalcitonin: <0.1 ng/mL

## 2021-03-24 LAB — T4, FREE: Free T4: 1.04 ng/dL (ref 0.61–1.12)

## 2021-03-24 LAB — URINALYSIS, ROUTINE W REFLEX MICROSCOPIC
Bilirubin Urine: NEGATIVE
Glucose, UA: NEGATIVE mg/dL
Ketones, ur: >80 mg/dL — AB
Leukocytes,Ua: NEGATIVE
Nitrite: NEGATIVE
Specific Gravity, Urine: 1.033 — ABNORMAL HIGH (ref 1.005–1.030)
pH: 6 (ref 5.0–8.0)

## 2021-03-24 LAB — C-REACTIVE PROTEIN: CRP: 20.7 mg/dL — ABNORMAL HIGH (ref <1.0)

## 2021-03-24 LAB — BRAIN NATRIURETIC PEPTIDE: B Natriuretic Peptide: 77.5 pg/mL (ref 0.0–100.0)

## 2021-03-24 LAB — HIV ANTIBODY (ROUTINE TESTING W REFLEX): HIV Screen 4th Generation wRfx: NONREACTIVE

## 2021-03-24 LAB — LACTATE DEHYDROGENASE: LDH: 204 U/L — ABNORMAL HIGH (ref 98–192)

## 2021-03-24 LAB — SEDIMENTATION RATE: Sed Rate: 110 mm/hr — ABNORMAL HIGH (ref 0–22)

## 2021-03-24 MED ORDER — LAMOTRIGINE ER 300 MG PO TB24
300.0000 mg | ORAL_TABLET | Freq: Every day | ORAL | Status: DC
  Administered 2021-03-25 – 2021-03-28 (×4): 300 mg via ORAL
  Filled 2021-03-24 (×5): qty 1

## 2021-03-24 MED ORDER — ACETAMINOPHEN 650 MG RE SUPP: 650.0000 mg | Freq: Four times a day (QID) | RECTAL | Status: DC | PRN

## 2021-03-24 MED ORDER — LAMOTRIGINE ER 300 MG PO TB24
1.0000 | ORAL_TABLET | Freq: Every day | ORAL | Status: DC
  Administered 2021-03-24: 150 mg via ORAL

## 2021-03-24 MED ORDER — GUAIFENESIN ER 600 MG PO TB12
600.0000 mg | ORAL_TABLET | Freq: Two times a day (BID) | ORAL | Status: DC
  Administered 2021-03-24 – 2021-03-28 (×8): 600 mg via ORAL
  Filled 2021-03-24 (×8): qty 1

## 2021-03-24 MED ORDER — ONDANSETRON HCL 4 MG PO TABS: 4.0000 mg | ORAL_TABLET | Freq: Four times a day (QID) | ORAL | Status: DC | PRN

## 2021-03-24 MED ORDER — ACETAMINOPHEN 325 MG PO TABS
650.0000 mg | ORAL_TABLET | Freq: Four times a day (QID) | ORAL | Status: DC | PRN
Start: 2021-03-24 — End: 2021-03-24
  Administered 2021-03-24 (×2): 650 mg via ORAL
  Filled 2021-03-24: qty 2

## 2021-03-24 MED ORDER — SENNOSIDES-DOCUSATE SODIUM 8.6-50 MG PO TABS: 1.0000 | ORAL_TABLET | Freq: Every evening | ORAL | Status: DC | PRN

## 2021-03-24 MED ORDER — ALBUTEROL SULFATE (2.5 MG/3ML) 0.083% IN NEBU: 2.5000 mg | INHALATION_SOLUTION | RESPIRATORY_TRACT | Status: DC | PRN

## 2021-03-24 MED ORDER — ENOXAPARIN SODIUM 40 MG/0.4ML IJ SOSY
40.0000 mg | PREFILLED_SYRINGE | INTRAMUSCULAR | Status: DC
  Administered 2021-03-24 – 2021-03-27 (×4): 40 mg via SUBCUTANEOUS
  Filled 2021-03-24 (×4): qty 0.4

## 2021-03-24 MED ORDER — ONDANSETRON HCL 4 MG/2ML IJ SOLN: 4.0000 mg | Freq: Four times a day (QID) | INTRAMUSCULAR | Status: DC | PRN

## 2021-03-24 MED ORDER — ACETAMINOPHEN 325 MG PO TABS
650.0000 mg | ORAL_TABLET | Freq: Four times a day (QID) | ORAL | Status: DC | PRN
  Administered 2021-03-25 (×2): 650 mg via ORAL
  Filled 2021-03-24 (×2): qty 2

## 2021-03-24 NOTE — Assessment & Plan Note (Signed)
Suspect this is pneumonia.  Currently receiving antibiotics.  Did not receive any steroids.

## 2021-03-24 NOTE — Progress Notes (Signed)
Pt stated she will have her daughter bring in her home medication Lamictal ER and inform nurse when she does.

## 2021-03-24 NOTE — H&P (Signed)
History and Physical    Katie Mcneil NTZ:[id number] DOB: May 24, 1951 DOA: 03/23/2021  PCP: Katie Coke, PA   Chief Complaint: Shortness of breath and cough and fever  HPI: Katie Mcneil is a 70 y.o. female with medical history significant of seizure disorder. Presents with complaints of shortness of breath.  Have low-grade fever as well as cough ongoing for almost 10 days. Was seen in the ER at which time patient had brief initial work-up included a CT PE protocol. Was suspected to have a viral illness and was sent home.  Had a lymphadenopathy and therefore was referred to general surgery as well and scheduled for outpatient PET scan. Present ER with complaints of shortness of breath and worsening shortness of breath due to persistent fever as well as worsening leukocytosis patient was referred for admission. At the time of my evaluation patient denies any complains of chest pain.  Abdominal pain.  No nausea or vomiting.  No diarrhea.  No GERD.  No smoking history.  No alcohol abuse history.  No substance abuse history.  No vaping history. No chemical exposure. Denies having any history of COVID or any other infection.  Review of Systems: As per HPI otherwise 10 point review of systems negative.   No Known Allergies  Past Medical History:  Diagnosis Date   Dyslipidemia    Seizures (Katie Mcneil)     Past Surgical History:  Procedure Laterality Date   CATARACT EXTRACTION W/ INTRAOCULAR LENS IMPLANT Bilateral 2019   CESAREAN SECTION  1981, 1990   NO PAST SURGERIES     TUBAL LIGATION       reports that she has never smoked. She has never used smokeless tobacco. She reports that she does not currently use alcohol. She reports that she does not use drugs.  Family History  Problem Relation Age of Onset   Neuropathy Mother    Anuerysm Father    Leukemia Sister 42   Alzheimer's disease Sister 68   Dementia Brother    Heart attack Maternal Grandmother    CVA Maternal Grandfather     Dementia Paternal Grandmother    Seizures Daughter    Other Daughter        Psychological disorder   Colon cancer Neg Hx     Prior to Admission medications   Medication Sig Start Date End Date Taking? Authorizing Provider  albuterol (VENTOLIN HFA) 108 (90 Base) MCG/ACT inhaler PLEASE SEE ATTACHED FOR DETAILED DIRECTIONS 03/14/21  Yes [provider]  LamoTRIgine 300 MG TB24 24 hour tablet Take 1 tablet (300 mg total) by mouth daily. 01/14/21  Yes Katie Givens, NP  Omega-3 Fatty Acids (FISH OIL) 1000 MG CAPS Take 1 capsule by mouth daily in the afternoon.   Yes [provider]  Vitamin D, Cholecalciferol, 25 MCG (1000 UT) CAPS Take 1 capsule by mouth daily in the afternoon.   Yes [provider]    Physical Exam: Vitals:   03/24/21 1354 03/24/21 1601 03/24/21 1635 03/24/21 1956  BP: 127/68 129/64 122/61 (!) 106/54  Pulse: 80 90 84 83  Resp: (!) 25 (!) 24 (!) 21 (!) 21  Temp:  (!) 100.7 F (38.2 C) 99.6 F (37.6 C) 98.5 F (36.9 C)  TempSrc:  Oral Oral Oral  SpO2: 100% 96% 95% 97%  Weight:   72.2 kg   Height:   5\' 6"  (1.676 m)     General: Appear in mild distress, no Rash; Oral Mucosa Clear, moist. no Abnormal Neck Mass Or lumps, Conjunctiva  normal  Cardiovascular: S1 and S2 Present, no Murmur, Respiratory: increased respiratory effort, Bilateral Air entry present and bilateral  Crackles, no wheezes Abdomen: Bowel Sound present, Soft and no tenderness Extremities: trace Pedal edema Neurology: alert and oriented to time, place, and person affect appropriate. no new focal deficit Gait not checked due to patient safety concerns     Labs on Admission: I have personally reviewed the patients's labs and imaging studies.  Assessment/Plan * Pneumonia Etiology not clear. Patient may have pneumonia versus pneumonitis.  This could also be lymphoma like picture.  This could be primary lung malignancy as well. Differential diagnosis for patient's  presentation is very wide. BNP is not elevated therefore edema is less likely.  Patient also denies any cardiac symptoms for now. Will initiate infection work-up with blood cultures, procalcitonin level.  Started antibiotics that the patient has already received. Initiate basic inflammatory work-up.  But will probably require pulmonary consultation as well as prior inflammation work-up. Patient has seen general surgery outpatient at [facility] and plan is to perform PET scan followed by possible lymph node biopsy based on the results currently patient may benefit from bronchoscopy if the basic work-up is negative. No exposure to chemicals or fumes or pets.  Denies any smoking history, substance abuse history or vaping history. No significant family history of pulmonary disease.  Father had PE in 55s.  Night sweats Work-up for lymphoma ongoing.  Currently may require infectious work-up as well.  Low grade fever Tree-in-bud appearance on the CT scan.  Tachypnea Due to pneumonia  Leucocytosis Suspect this is pneumonia.  Currently receiving antibiotics.  Did not receive any steroids.  Lymphadenopathy See above.  General surgery consulted outpatient and patient scheduled to go for PET scan.  Anemia H&H relatively low.  Will initiate further work-up.  Seizure ([place]) On Lamictal.  Continue.   Admission status: Inpatient Med-Surg  Certification: The appropriate patient status for this patient is INPATIENT. Inpatient status is judged to be reasonable and necessary in order to provide the required intensity of service to ensure the patient's safety. The patient's presenting symptoms, physical exam findings, and initial radiographic and laboratory data in the context of their chronic comorbidities is felt to place them at high risk for further clinical deterioration. Furthermore, it is not anticipated that the patient will be medically stable for discharge from the hospital within 2 midnights of  admission. The following factors support the patient status of inpatient.   * I certify that at the point of admission it is my clinical judgment that the patient will require inpatient hospital care spanning beyond 2 midnights from the point of admission due to high intensity of service, high risk for further deterioration and high frequency of surveillance required.Berle Mull MD [facility] If 7PM-7AM, please contact night-coverage [email or web address]  03/24/2021, 7:58 PM

## 2021-03-24 NOTE — Assessment & Plan Note (Signed)
Work-up for lymphoma ongoing.  Currently may require infectious work-up as well.

## 2021-03-24 NOTE — Assessment & Plan Note (Signed)
Tree-in-bud appearance on the CT scan.

## 2021-03-24 NOTE — Progress Notes (Signed)
Pt arrived to floor at 1549 by [facility] via stretcher. Pt ambulatory from stretcher to bed with steady gait. Pt alert and oriented x4 in no acute distress. VSS. Temp 100.7 F. Tylenol given. Respirations even and unlabored on RA. Assessment completed. Call bell within reach. Pt encourage to call for assistance. Bed in low position.

## 2021-03-24 NOTE — Assessment & Plan Note (Signed)
Due to pneumonia 

## 2021-03-24 NOTE — ED Notes (Signed)
[facility] at bedside, report given to [facility].

## 2021-03-24 NOTE — Assessment & Plan Note (Addendum)
Etiology not clear. Patient may have pneumonia versus pneumonitis.  This could also be lymphoma like picture.  This could be primary lung malignancy as well. Differential diagnosis for patient's presentation is very wide. BNP is not elevated therefore edema is less likely.  Patient also denies any cardiac symptoms for now. Will initiate infection work-up with blood cultures, procalcitonin level.  Started antibiotics that the patient has already received. Initiate basic inflammatory work-up.  But will probably require pulmonary consultation as well as prior inflammation work-up. Patient has seen general surgery outpatient at [facility] and plan is to perform PET scan followed by possible lymph node biopsy based on the results currently patient may benefit from bronchoscopy if the basic work-up is negative. No exposure to chemicals or fumes or pets.  Denies any smoking history, substance abuse history or vaping history. No significant family history of pulmonary disease.  Father had PE in 38s.

## 2021-03-24 NOTE — ED Notes (Signed)
Attempted to call report to [place], Therapist, sports, 5W at [employer].  Per Financial controller, RN is in contact room and cannot answer phone.  RN will call me back for report.

## 2021-03-24 NOTE — Assessment & Plan Note (Signed)
See above.  General surgery consulted outpatient and patient scheduled to go for PET scan.

## 2021-03-24 NOTE — Assessment & Plan Note (Signed)
On Lamictal.  Continue.

## 2021-03-24 NOTE — Assessment & Plan Note (Signed)
H&H relatively low.  Will initiate further work-up.

## 2021-03-25 ENCOUNTER — Inpatient Hospital Stay (HOSPITAL_COMMUNITY): Payer: BC Managed Care – PPO

## 2021-03-25 DIAGNOSIS — R609 Edema, unspecified: Secondary | ICD-10-CM | POA: Diagnosis not present

## 2021-03-25 DIAGNOSIS — R61 Generalized hyperhidrosis: Secondary | ICD-10-CM

## 2021-03-25 DIAGNOSIS — D508 Other iron deficiency anemias: Secondary | ICD-10-CM

## 2021-03-25 DIAGNOSIS — R591 Generalized enlarged lymph nodes: Secondary | ICD-10-CM

## 2021-03-25 LAB — CBC WITH DIFFERENTIAL/PLATELET
Abs Immature Granulocytes: 0.14 10*3/uL — ABNORMAL HIGH (ref 0.00–0.07)
Basophils Absolute: 0.1 10*3/uL (ref 0.0–0.1)
Basophils Relative: 1 %
Eosinophils Absolute: 0.7 10*3/uL — ABNORMAL HIGH (ref 0.0–0.5)
Eosinophils Relative: 4 %
HCT: 29.6 % — ABNORMAL LOW (ref 36.0–46.0)
Hemoglobin: 9.5 g/dL — ABNORMAL LOW (ref 12.0–15.0)
Immature Granulocytes: 1 %
Lymphocytes Relative: 22 %
Lymphs Abs: 3.6 10*3/uL (ref 0.7–4.0)
MCH: 28.3 pg (ref 26.0–34.0)
MCHC: 32.1 g/dL (ref 30.0–36.0)
MCV: 88.1 fL (ref 80.0–100.0)
Monocytes Absolute: 1.5 10*3/uL — ABNORMAL HIGH (ref 0.1–1.0)
Monocytes Relative: 9 %
Neutro Abs: 10.2 10*3/uL — ABNORMAL HIGH (ref 1.7–7.7)
Neutrophils Relative %: 63 %
Platelets: 411 10*3/uL — ABNORMAL HIGH (ref 150–400)
RBC: 3.36 MIL/uL — ABNORMAL LOW (ref 3.87–5.11)
RDW: 13.2 % (ref 11.5–15.5)
WBC: 16.1 10*3/uL — ABNORMAL HIGH (ref 4.0–10.5)
nRBC: 0 % (ref 0.0–0.2)

## 2021-03-25 LAB — PROCALCITONIN: Procalcitonin: <0.1 ng/mL

## 2021-03-25 LAB — URINE CULTURE: Culture: NO GROWTH

## 2021-03-25 LAB — BASIC METABOLIC PANEL
Anion gap: 12 (ref 5–15)
BUN: 9 mg/dL (ref 8–23)
CO2: 23 mmol/L (ref 22–32)
Calcium: 8.8 mg/dL — ABNORMAL LOW (ref 8.9–10.3)
Chloride: 102 mmol/L (ref 98–111)
Creatinine, Ser: 0.6 mg/dL (ref 0.44–1.00)
GFR, Estimated: >60 mL/min (ref >60)
Glucose, Bld: 89 mg/dL (ref 70–99)
Potassium: 3.5 mmol/L (ref 3.5–5.1)
Sodium: 137 mmol/L (ref 135–145)

## 2021-03-25 LAB — FIBRINOGEN: Fibrinogen: 729 mg/dL — ABNORMAL HIGH (ref 210–475)

## 2021-03-25 LAB — MAGNESIUM: Magnesium: 2.1 mg/dL (ref 1.7–2.4)

## 2021-03-25 LAB — FERRITIN: Ferritin: 175 ng/mL (ref 11–307)

## 2021-03-25 LAB — C-REACTIVE PROTEIN: CRP: 18.6 mg/dL — ABNORMAL HIGH (ref <1.0)

## 2021-03-25 LAB — TRIGLYCERIDES: Triglycerides: 107 mg/dL (ref <150)

## 2021-03-25 MED ORDER — POTASSIUM CHLORIDE CRYS ER 20 MEQ PO TBCR
40.0000 meq | EXTENDED_RELEASE_TABLET | Freq: Once | ORAL | Status: AC
  Administered 2021-03-25: 40 meq via ORAL
  Filled 2021-03-25: qty 2

## 2021-03-25 MED ORDER — LACTATED RINGERS IV SOLN: INTRAVENOUS | Status: AC

## 2021-03-25 MED ORDER — AZITHROMYCIN 500 MG PO TABS
500.0000 mg | ORAL_TABLET | Freq: Every day | ORAL | Status: AC
  Administered 2021-03-25 – 2021-03-27 (×3): 500 mg via ORAL
  Filled 2021-03-25 (×4): qty 1

## 2021-03-25 MED ORDER — IOHEXOL 300 MG/ML  SOLN
100.0000 mL | Freq: Once | INTRAMUSCULAR | Status: AC | PRN
  Administered 2021-03-25: 100 mL via INTRAVENOUS

## 2021-03-25 NOTE — Progress Notes (Addendum)
Katie Mcneil   HEMATOLOGY/ONCOLOGY CONSULTATION NOTE  Date of Service: 03/25/2021  Patient Care Team: [name], [place] as PCP - General (Physician Assistant)  CHIEF COMPLAINTS/PURPOSE OF CONSULTATION:  Generalized Lymphadenopathy concerning for Lymphoma  HISTORY OF PRESENTING ILLNESS:   Katie Mcneil is a wonderful 70 y.o. female who has been referred to [place] by Dr Lala Lund for evaluation and management of generalized lymphadenopathy concerning for possible lymphoma.  Patient has a history of seizure disorder and dyslipidemia and initially presented to the emergency room on 03/14/2021 with symptoms of cough, congestion and shortness of breath.  Due to mildly elevated D-dimer she had a CTA of the chest on 03/14/2021 which showed no evidence of PE but she was noted to have mediastinal, hilar, bilateral axillary and upper abdominal lymphadenopathy of uncertain etiology. She was recommended to follow-up with her primary care physician for further evaluation of her lymphadenopathy on discharge from the emergency room.  Patient returns with low-grade fevers as well as ongoing cough and shortness of breath as well as worsening leukocytosis and persistent fevers.  She was tested for COVID 19 and was negative on 2 occasions. Repeat CTA chest on 03/23/2021 show persistent Mediastinal, bilateral hilar and bilateral axillary as well as upper abdominal adenopathy, stable since prior study.  She was also noted to have new bilateral airspace disease left greater than right concerning for multifocal pneumonia. No evidence of PE.  Small left pleural effusion.  Patient has been started on ceftriaxone and azithromycin for community-acquired multifocal pneumonia and is being worked up for infectious etiology for her pneumonia.   No history of dysphagia or aspiration.  No nausea no vomiting no diarrhea. Note history of drug use or vaping.  MEDICAL HISTORY:  Past Medical History:  Diagnosis Date   Dyslipidemia     Seizures ([place])     SURGICAL HISTORY: Past Surgical History:  Procedure Laterality Date   CATARACT EXTRACTION W/ INTRAOCULAR LENS IMPLANT Bilateral 2019   CESAREAN SECTION  1981, 1990   NO PAST SURGERIES     TUBAL LIGATION      SOCIAL HISTORY: Social History   Socioeconomic History   Marital status: Widowed    Spouse name: Not on file   Number of children: 2   Years of education: Not on file   Highest education level: Not on file  Occupational History   Not on file  Tobacco Use   Smoking status: Never   Smokeless tobacco: Never  Vaping Use   Vaping Use: Never used  Substance and Sexual Activity   Alcohol use: Not Currently    Alcohol/week: 0.0 standard drinks   Drug use: No   Sexual activity: Not Currently  Other Topics Concern   Not on file  Social History Narrative   Works in Engineer, 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[employer], [employer]   Lives with daughter (who has mental health issues)   Caregiver for mother   Has two children   Widowed   Social Determinants of Health   Financial Resource Strain: Not on file  Food Insecurity: Not on file  Transportation Needs: Not on file  Physical Activity: Not on file  Stress: Not on file  Social Connections: Not on file  Intimate Partner Violence: Not on file    FAMILY HISTORY: Family History  Problem Relation Age of Onset   Neuropathy Mother    Anuerysm Father    Leukemia Sister 75   Alzheimer's disease Sister 69   Dementia Brother    Heart attack Maternal Grandmother  CVA Maternal Grandfather    Dementia Paternal Grandmother    Seizures Daughter    Other Daughter        Psychological disorder   Colon cancer Neg Hx     ALLERGIES:  has No Known Allergies.  MEDICATIONS:  Current Facility-Administered Medications  Medication Dose Route Frequency Provider Last Rate Last Admin   0.9 %  sodium chloride infusion   Intravenous PRN Curatolo, Adam, DO 10 mL/hr at 03/23/21 1733 250 mL at 03/23/21 1733   acetaminophen (TYLENOL) tablet 650  mg  650 mg Oral Q6H PRN Lavina Hamman, MD   650 mg at 03/25/21 0024   Or   acetaminophen (TYLENOL) suppository 650 mg  650 mg Rectal Q6H PRN Lavina Hamman, MD       albuterol (PROVENTIL) (2.5 MG/3ML) 0.083% nebulizer solution 2.5 mg  2.5 mg Nebulization Q2H PRN Lavina Hamman, MD       azithromycin [facility]) tablet 500 mg  500 mg Oral q1800 Pham, Minh Q, RPH-CPP       cefTRIAXone (ROCEPHIN) 2 g in sodium chloride 0.9 % 100 mL IVPB  2 g Intravenous Q24H Curatolo, Adam, DO   Stopped at 03/24/21 1808   enoxaparin (LOVENOX) injection 40 mg  40 mg Subcutaneous Q24H Lavina Hamman, MD   40 mg at 03/24/21 2130   guaiFENesin (MUCINEX) 12 hr tablet 600 mg  600 mg Oral BID Lavina Hamman, MD   600 mg at 03/25/21 1000   lactated ringers infusion   Intravenous Continuous Thurnell Lose, MD 75 mL/hr at 03/25/21 0921 New Bag at 03/25/21 7106   LamoTRIgine 24 hour tablet TB24 300 mg  300 mg Oral Daily Arrien, Jimmy Picket, MD   300 mg at 03/25/21 0924   ondansetron (ZOFRAN) tablet 4 mg  4 mg Oral Q6H PRN Lavina Hamman, MD       Or   ondansetron [facility]) injection 4 mg  4 mg Intravenous Q6H PRN Lavina Hamman, MD       senna-docusate (Senokot-S) tablet 1 tablet  1 tablet Oral QHS PRN Lavina Hamman, MD        REVIEW OF SYSTEMS:    10 Point review of Systems was done is negative except as noted above.  PHYSICAL EXAMINATION: ECOG PERFORMANCE STATUS: 1 - Symptomatic but completely ambulatory  . Vitals:   03/25/21 0744 03/25/21 1151  BP: 137/70 (!) 110/57  Pulse: 78 88  Resp: (!) 22 20  Temp: 99.3 F (37.4 C) 100 F (37.8 C)  SpO2: 93% 95%   Filed Weights   03/23/21 1521 03/24/21 1635 03/25/21 0500  Weight: 160 lb (72.6 kg) 159 lb 2.8 oz (72.2 kg) 161 lb 6 oz (73.2 kg)   .Body mass index is 26.05 kg/m.  GENERAL:alert, in no acute distress and comfortable SKIN: no acute rashes, no significant lesions EYES: conjunctiva are pink and non-injected, sclera anicteric OROPHARYNX:  MMM, no exudates, no oropharyngeal erythema or ulceration NECK: supple, no JVD LYMPH:  b/l cervical palpable LNadenopathy and left supraclavicular fullness, b/l palpable left axillary LN. LUNGS: Bilateral scattered rales HEART: regular rate & rhythm ABDOMEN:  normoactive bowel sounds , non tender, not distended. Extremity: no pedal edema PSYCH: alert & oriented x 3 with fluent speech NEURO: no focal motor/sensory deficits  LABORATORY DATA:  I have reviewed the data as listed  . CBC Latest Ref Rng & Units 03/25/2021 03/23/2021 03/14/2021  WBC 4.0 - 10.5 K/uL 16.1(H) 17.4(H) 13.1(H)  Hemoglobin  12.0 - 15.0 g/dL 9.5(L) 10.4(L) 10.2(L)  Hematocrit 36.0 - 46.0 % 29.6(L) 33.0(L) 33.0(L)  Platelets 150 - 400 K/uL 411(H) 387 PLATELET CLUMPS NOTED ON SMEAR, UNABLE TO ESTIMATE   .CBC    Component Value Date/Time   WBC 16.1 (H) 03/25/2021 0450   RBC 3.36 (L) 03/25/2021 0450   HGB 9.5 (L) 03/25/2021 0450   HGB 11.3 12/14/2018 1017   HCT 29.6 (L) 03/25/2021 0450   HCT 35.7 12/14/2018 1017   PLT 411 (H) 03/25/2021 0450   PLT 289 12/14/2018 1017   MCV 88.1 03/25/2021 0450   MCV 87 12/14/2018 1017   MCH 28.3 03/25/2021 0450   MCHC 32.1 03/25/2021 0450   RDW 13.2 03/25/2021 0450   RDW 13.6 12/14/2018 1017   LYMPHSABS 3.6 03/25/2021 0450   LYMPHSABS 1.3 12/14/2018 1017   MONOABS 1.5 (H) 03/25/2021 0450   EOSABS 0.7 (H) 03/25/2021 0450   EOSABS 0.3 12/14/2018 1017   BASOSABS 0.1 03/25/2021 0450   BASOSABS 0.0 12/14/2018 1017    CMP Latest Ref Rng & Units 03/25/2021 03/23/2021 03/14/2021  Glucose 70 - 99 mg/dL 89 96 120(H)  BUN 8 - 23 mg/dL 9 9 16   Creatinine 0.44 - 1.00 mg/dL 0.60 0.64 0.56  Sodium 135 - 145 mmol/L 137 137 138  Potassium 3.5 - 5.1 mmol/L 3.5 3.7 3.6  Chloride 98 - 111 mmol/L 102 102 104  CO2 22 - 32 mmol/L 23 23 25   Calcium 8.9 - 10.3 mg/dL 8.8(L) 9.6 8.9  Total Protein 6.5 - 8.1 g/dL - 7.9 -  Total Bilirubin 0.3 - 1.2 mg/dL - 0.5 -  Alkaline Phos 38 - 126 U/L -  123 -  AST 15 - 41 U/L - 31 -  ALT 0 - 44 U/L - 39 -   . Lab Results  Component Value Date   LDH 204 (H) 03/24/2021    RADIOGRAPHIC STUDIES: I have personally reviewed the radiological images as listed and agreed with the findings in the report. DG Chest 2 View  Result Date: 03/23/2021 CLINICAL DATA:  sob EXAM: CHEST - 2 VIEW COMPARISON:  Chest radiograph December 20, 2014. chest CT March 14, 2021. FINDINGS: Patchy opacities in the left lung and right upper lobe. No visible pleural effusions or pneumothorax. Cardiomediastinal silhouette is within normal limits. No acute osseous abnormality. Mild reverse S-shaped thoracolumbar curvature. IMPRESSION: Patchy opacities in the left lung and right upper lobe, concerning for multifocal pneumonia. Recommend imaging follow-up to ensure resolution. Electronically Signed   By: Margaretha Sheffield M.D.   On: 03/23/2021 16:04   CT Angio Chest PE W and/or Wo Contrast  Result Date: 03/23/2021 CLINICAL DATA:  Fever, cough, shortness of breath EXAM: CT ANGIOGRAPHY CHEST WITH CONTRAST TECHNIQUE: Multidetector CT imaging of the chest was performed using the standard protocol during bolus administration of intravenous contrast. Multiplanar CT image reconstructions and MIPs were obtained to evaluate the vascular anatomy. CONTRAST:  58mL OMNIPAQUE IOHEXOL 350 MG/ML SOLN COMPARISON:  03/14/2021 FINDINGS: Cardiovascular: No filling defects in the pulmonary arteries to suggest pulmonary emboli. Heart is normal size. Aorta is normal caliber. Scattered coronary artery calcifications and aortic calcifications. Mediastinum/Nodes: There is mediastinal, bilateral hilar and bilateral axillary adenopathy again noted, stable since prior study. Trachea and esophagus are unremarkable. Lungs/Pleura: New bilateral ground-glass airspace opacities noted with tree-in-bud nodular densities also seen bilaterally. Findings are more pronounced on the left. Findings likely reflect multifocal  pneumonia. Atypical/viral pneumonia possible. Small left pleural effusion. Upper Abdomen: Upper abdominal/retroperitoneal adenopathy again  noted, unchanged since prior study. Musculoskeletal: Chest wall soft tissues are unremarkable. No acute bony abnormality. Review of the MIP images confirms the above findings. IMPRESSION: Mediastinal, bilateral hilar and bilateral axillary as well as upper abdominal adenopathy, stable since prior study. Findings are concerning for lymphoma or metastatic disease. Recommend clinical correlation. New bilateral airspace disease, left greater than right concerning for multifocal pneumonia. Small left pleural effusion. No evidence of pulmonary embolus. Coronary artery disease. Aortic Atherosclerosis (ICD10-I70.0). Electronically Signed   By: Rolm Baptise M.D.   On: 03/23/2021 18:37   CT Angio Chest PE W and/or Wo Contrast  Result Date: 03/14/2021 CLINICAL DATA:  High probability for PE. Cough and shortness of breath. EXAM: CT ANGIOGRAPHY CHEST WITH CONTRAST TECHNIQUE: Multidetector CT imaging of the chest was performed using the standard protocol during bolus administration of intravenous contrast. Multiplanar CT image reconstructions and MIPs were obtained to evaluate the vascular anatomy. CONTRAST:  79mL OMNIPAQUE IOHEXOL 350 MG/ML SOLN COMPARISON:  None. FINDINGS: Cardiovascular: Satisfactory opacification of the pulmonary arteries to the segmental level. No evidence of pulmonary embolism. Normal heart size. No pericardial effusion. Mediastinum/Nodes: There is an indeterminate 11 mm right thyroid nodule. There is an enlarged subcarinal lymph node measuring 13 mm short axis. There are enlarged bilateral hilar lymph nodes measuring up to 15 mm short axis. There also enlarged bilateral axillary lymph nodes. The largest lymph node on the left measures 1.8 by 3.7 cm in the largest lymph node on the right measures 1.3 by 2.0 cm. The esophagus is nondilated. Lungs/Pleura: Lungs are  clear. No pleural effusion or pneumothorax. Upper Abdomen: There is likely adenopathy in the periaortic region image 4/136 measuring 3.7 x 1.4 cm. Musculoskeletal: No chest wall abnormality. No acute or significant osseous findings. Review of the MIP images confirms the above findings. IMPRESSION: 1. No evidence for pulmonary embolism. 2. Mediastinal, hilar, bilateral axillary and upper abdominal lymphadenopathy of uncertain etiology. Findings may be related to lymphoma or metastatic disease. Recommend clinical correlation and follow-up. 3. 1.1 cm incidental right thyroid nodule. No follow-up imaging is recommended. Reference: J Am Coll Radiol. 2015 Feb;12(2): 143-50 Electronically Signed   By: Ronney Asters M.D.   On: 03/14/2021 23:46   VAS [place] LOWER EXTREMITY VENOUS (DVT)  Result Date: 03/25/2021  Lower Venous DVT Study Patient Name:  Katie Mcneil  Date of Exam:   03/25/2021 Medical Rec #: [id number]       Accession #:    [id number] Date of Birth: [birth year]       Patient Gender: F Patient Age:   10 years Exam Location:  [facility] Procedure:      VAS [place] LOWER EXTREMITY VENOUS (DVT) Referring Phys: PRANAV PATEL --------------------------------------------------------------------------------  Indications: Edema.  Comparison Study: no prior Performing Technologist: Archie Patten RVS  Examination Guidelines: A complete evaluation includes B-mode imaging, spectral Doppler, color Doppler, and power Doppler as needed of all accessible portions of each vessel. Bilateral testing is considered an integral part of a complete examination. Limited examinations for reoccurring indications may be performed as noted. The reflux portion of the exam is performed with the patient in reverse Trendelenburg.  +---------+---------------+---------+-----------+----------+--------------+ RIGHT    CompressibilityPhasicitySpontaneityPropertiesThrombus Aging  +---------+---------------+---------+-----------+----------+--------------+ CFV      Full           Yes      Yes                                 +---------+---------------+---------+-----------+----------+--------------+  SFJ      Full                                                        +---------+---------------+---------+-----------+----------+--------------+ FV Prox  Full                                                        +---------+---------------+---------+-----------+----------+--------------+ FV Mid   Full                                                        +---------+---------------+---------+-----------+----------+--------------+ FV DistalFull                                                        +---------+---------------+---------+-----------+----------+--------------+ PFV      Full                                                        +---------+---------------+---------+-----------+----------+--------------+ POP      Full           Yes      Yes                                 +---------+---------------+---------+-----------+----------+--------------+ PTV      Full                                                        +---------+---------------+---------+-----------+----------+--------------+ PERO     Full                                                        +---------+---------------+---------+-----------+----------+--------------+   +---------+---------------+---------+-----------+----------+--------------+ LEFT     CompressibilityPhasicitySpontaneityPropertiesThrombus Aging +---------+---------------+---------+-----------+----------+--------------+ CFV      Full           Yes      Yes                                 +---------+---------------+---------+-----------+----------+--------------+ SFJ      Full                                                         +---------+---------------+---------+-----------+----------+--------------+  FV Prox  Full                                                        +---------+---------------+---------+-----------+----------+--------------+ FV Mid   Full                                                        +---------+---------------+---------+-----------+----------+--------------+ FV DistalFull                                                        +---------+---------------+---------+-----------+----------+--------------+ PFV      Full                                                        +---------+---------------+---------+-----------+----------+--------------+ POP      Full           Yes      Yes                                 +---------+---------------+---------+-----------+----------+--------------+ PTV      Full                                                        +---------+---------------+---------+-----------+----------+--------------+ PERO     Full                                                        +---------+---------------+---------+-----------+----------+--------------+     Summary: BILATERAL: - No evidence of deep vein thrombosis seen in the lower extremities, bilaterally. -No evidence of popliteal cyst, bilaterally.   *See table(s) above for measurements and observations.    Preliminary     ASSESSMENT & PLAN:   70 year old female with history of seizure disorder with  1) Generalized lymphadenopathy CTA chest showed Mediastinal, hilar, bilateral axillary and upper abdominal lymphadenopathy. This is in the context of an acute respiratory illness. Borderline LDH elevation could be from lung inflammation or lymphoproliferative process.  2) bilateral lung infiltrates concerning for multifocal pneumonia.  Lymphadenopathy could be reactive in the setting of a viral infection with possible superadded bacterial pneumonia.  COVID-19 testing was negative.   Acute EBV infection or CMV infection would also be other possibilities . Extended viral respiratory panel has been ordered and is currently pending.  Blood cultures pending.  Other septic work-up is in the process.  Procalcitonin level is not elevated.  However based on the size and extent of lymphadenopathy it  would be appropriate to rule out the possibility of a lymphoproliferative disorder or other metastatic malignancy.  Cannot rule out the possibility of other inflammatory conditions such as sarcoidosis which could present as a lymphoma on the neck.  PLAN -Appreciate excellent hospital medicine care. -Septic work-up and empiric antibiotics for multifocal pneumonia as per hospital medicine team -Patient did have a CT of the abdomen and pelvis to complete her staging work-up which also showed extensive adenopathy throughout the abdomen and pelvis which goes more in favor of a lymphoproliferative disorder.  No other overt primary tumor noted on CT chest or abdomen and pelvis. -Given that the LDH level is only minimally elevated in the context of extensive adenopathy this will be more consistent with generally a low-grade process. -Patient has leukocytosis but this appears to be primarily neutrophilia with no overt lymphocytosis. This can be seen with Hodgkins lymphoma alongwith elevated inflammatory markers. -Would recommend ultrasound-guided core needle biopsy of one of the enlarged lymph nodes possibly the axillary lymph node for definitive diagnosis. -Initial follow-up with allergy results from lymph node biopsy when available. -Patient is on Lamictal for seizure disorder.  This does carry a black box warning for the possibility of secondary [place] however the patient has no significant cytopenias and her ferritin LDH and fibrinogen levels are not consistent with HLH. -Hematology will continue to follow  All of the patients questions were answered with apparent satisfaction. The patient knows to  call the clinic with any problems, questions or concerns.  I spent 45 minutes counseling the patient face to face. The total time spent in the appointment was 80 minutes and more than 50% was on counseling and direct patient cares.    Sullivan Lone MD [place] AAHIVMS [facility] Hematology/Oncology Physician [facility]  (Office):       [phone] (Work cell):  [phone] (Fax):           [phone]  03/25/2021 2:21 PM

## 2021-03-25 NOTE — Progress Notes (Signed)
PHARMACIST - PHYSICIAN COMMUNICATION  DR:   Candiss Norse  CONCERNING: IV to Oral Route Change Policy  RECOMMENDATION: This patient is receiving azithromycin by the intravenous route.  Based on criteria approved by the Pharmacy and Therapeutics Committee, the intravenous medication(s) is/are being converted to the equivalent oral dose form(s).   DESCRIPTION: These criteria include: The patient is eating (either orally or via tube) and/or has been taking other orally administered medications for a least 24 hours The patient has no evidence of active gastrointestinal bleeding or impaired GI absorption (gastrectomy, short bowel, patient on TNA or NPO).  If you have questions about this conversion, please contact the Pharmacy Department  []   [phone] )  [facility] []   [phone] )  [facility] [x]   [phone] )  [facility] []   [phone] )  [facility] []   [phone] )  [place], PharmD, [place], AAHIVP, CPP Infectious Disease Pharmacist 03/25/2021 8:38 AM

## 2021-03-25 NOTE — Evaluation (Signed)
Occupational Therapy Evaluation Patient Details Name: Katie Mcneil MRN: [id number] DOB: 1951-01-18 Today's Date: 03/25/2021   History of Present Illness 70 y.o. female with medical history significant of seizure disorder.  Presented with complaints of shortness of breath, low-grade fever and cough.  Droplet precautions.   Clinical Impression   Patient admitted with the diagnosis above.  PTA she lives with her daughter, who is able to assist as needed.  She was working full time approximately two weeks ago, but is considering retirement, or a reduced schedule.  She needs no assist with ADL or mobility at home, and is currently at, or near, her baseline for ADL and in room mobility/toileting.  No OT needs identified in the acute setting.  Encouraged patient to walk the halls with staff.  PT eval is pending.          Recommendations for follow up therapy are one component of a multi-disciplinary discharge planning process, led by the attending physician.  Recommendations may be updated based on patient status, additional functional criteria and insurance authorization.   Follow Up Recommendations  No OT follow up    Equipment Recommendations  None recommended by OT    Recommendations for Other Services       Precautions / Restrictions Precautions Precautions: Other (comment) Precaution Comments: Droplet Restrictions Weight Bearing Restrictions: No RUE Weight Bearing: Partial weight bearing RUE Partial Weight Bearing Percentage or Pounds: No formal order, but non operative wrist fx times four week.  Patient does remove the brace for ADL completion.      Mobility Bed Mobility Overal bed mobility: Modified Independent                  Transfers Overall transfer level: Modified independent               General transfer comment: patient up and moving in room ad lib.  Only complaint is generalized weakness from recent illness.    Balance Overall balance assessment:  No apparent balance deficits (not formally assessed)                                         ADL either performed or assessed with clinical judgement   ADL Overall ADL's : At baseline                                             Vision Patient Visual Report: No change from baseline       Perception     Praxis      Pertinent Vitals/Pain Pain Assessment: No/denies pain     Hand Dominance Right   Extremity/Trunk Assessment Upper Extremity Assessment Upper Extremity Assessment: Overall WFL for tasks assessed   Lower Extremity Assessment Lower Extremity Assessment: Defer to PT evaluation   Cervical / Trunk Assessment Cervical / Trunk Assessment: Normal   Communication Communication Communication: No difficulties   Cognition Arousal/Alertness: Awake/alert Behavior During Therapy: WFL for tasks assessed/performed Overall Cognitive Status: History of cognitive impairments - at baseline                                 General Comments: Has bee evaluated in the past for ST memory and complex thought impairments -  mild.   General Comments   VSS on RA    Exercises     Shoulder Instructions      Home Living Family/patient expects to be discharged to:: Private residence Living Arrangements: Children Available Help at Discharge: Family;Available 24 hours/day Type of Home: House Home Access: Stairs to enter [employer] of Steps: 1   Home Layout: One level     Bathroom Shower/Tub: Teacher, early years/pre: Standard     Home Equipment: None          Prior Functioning/Environment Level of Independence: Independent                 OT Problem List: Decreased activity tolerance      OT Treatment/Interventions:      OT Goals(Current goals can be found in the care plan section) Acute Rehab OT Goals Patient Stated Goal: Go home when I'm able OT Goal Formulation: With patient Time For  Goal Achievement: 03/25/21  OT Frequency:     Barriers to D/C:  None noted          Co-evaluation              AM-PAC OT "6 Clicks" Daily Activity     Outcome Measure Help from another person eating meals?: None Help from another person taking care of personal grooming?: None Help from another person toileting, which includes using toliet, bedpan, or urinal?: None Help from another person bathing (including washing, rinsing, drying)?: None Help from another person to put on and taking off regular upper body clothing?: None Help from another person to put on and taking off regular lower body clothing?: None 6 Click Score: 24   End of Session    Activity Tolerance: Patient tolerated treatment well Patient left: in bed;with call bell/phone within reach;with nursing/sitter in room  OT Visit Diagnosis: Other (comment) (Shortness of breath)                Time: 3557-3220 OT Time Calculation (min): 23 min Charges:  OT General Charges $OT Visit: 1 Visit OT Evaluation $OT Eval Moderate Complexity: 1 Mod OT Treatments $Self Care/Home Management : 8-22 mins  03/25/2021  RP, OTR/L  Acute Rehabilitation Services  Office:  [phone]   Metta Clines 03/25/2021, 9:35 AM

## 2021-03-25 NOTE — Evaluation (Signed)
Physical Therapy Evaluation Patient Details Name: Katie Mcneil MRN: [id number] DOB: July 17, 1950 Today's Date: 03/25/2021  History of Present Illness  70yo female who presented on 10/10 with c/o SOB, fever. Covid and PE negative. Admitted for w/u of PNA vs pneumonitis vs possible lung malignancy. PMH HLD, seizures, fall with wrist fx  Clinical Impression   Patient received in recliner, pleasant and cooperative today. Reports she is typically very active and works in Geologist, engineering; did have a fall about 4 weeks ago when rushing trying to get dressed in the morning, but no other incidents or concerns with mobility. Able to walk through hallway on an independent basis; SOB with activity on RA but VSS. Left up in recliner with all needs met, family present. No need for skilled PT services at this point- signing off, thank you for the referral!        Recommendations for follow up therapy are one component of a multi-disciplinary discharge planning process, led by the attending physician.  Recommendations may be updated based on patient status, additional functional criteria and insurance authorization.  Follow Up Recommendations No PT follow up    Equipment Recommendations  None recommended by PT    Recommendations for Other Services       Precautions / Restrictions Precautions Precautions: Other (comment) Precaution Comments: Droplet Restrictions Weight Bearing Restrictions: No RUE Weight Bearing: Partial weight bearing RUE Partial Weight Bearing Percentage or Pounds: no formal order, but non op wrist fx times 4 weeks. Patient does remove the brace for ADL completion      Mobility  Bed Mobility Overal bed mobility: Modified Independent             General bed mobility comments: OOB in recliner upon entry    Transfers Overall transfer level: Independent Equipment used: None             General transfer comment: patient up and moving in room ad lib.  Only complaint is  generalized weakness from recent illness.  Ambulation/Gait Ambulation/Gait assistance: Independent Gait Distance (Feet): 150 Feet Assistive device: None Gait Pattern/deviations: WFL(Within Functional Limits);Step-through pattern Gait velocity: decreased   General Gait Details: gait pattern WNL- did need multiple standing rest breaks due to mild SOB, but VSS on RA  Stairs            Wheelchair Mobility    Modified Rankin (Stroke Patients Only)       Balance Overall balance assessment: No apparent balance deficits (not formally assessed)                                           Pertinent Vitals/Pain Pain Assessment: No/denies pain    Home Living Family/patient expects to be discharged to:: Private residence Living Arrangements: Children Available Help at Discharge: Family;Available 24 hours/day Type of Home: House Home Access: Stairs to enter   [employer] of Steps: 1 Home Layout: One level Home Equipment: None      Prior Function Level of Independence: Independent               Hand Dominance   Dominant Hand: Right    Extremity/Trunk Assessment   Upper Extremity Assessment Upper Extremity Assessment: Defer to OT evaluation    Lower Extremity Assessment Lower Extremity Assessment: Overall WFL for tasks assessed    Cervical / Trunk Assessment Cervical / Trunk Assessment: Normal  Communication   Communication: No  difficulties  Cognition Arousal/Alertness: Awake/alert Behavior During Therapy: WFL for tasks assessed/performed Overall Cognitive Status: History of cognitive impairments - at baseline                                 General Comments: Has bee evaluated in the past for ST memory and complex thought impairments - mild.      General Comments General comments (skin integrity, edema, etc.): VSS on RA    Exercises     Assessment/Plan    PT Assessment Patent does not need any further PT  services  PT Problem List         PT Treatment Interventions      PT Goals (Current goals can be found in the Care Plan section)  Acute Rehab PT Goals Patient Stated Goal: Go home when I'm able PT Goal Formulation: With patient Time For Goal Achievement: 04/08/21 Potential to Achieve Goals: Good    Frequency     Barriers to discharge        Co-evaluation               AM-PAC PT "6 Clicks" Mobility  Outcome Measure Help needed turning from your back to your side while in a flat bed without using bedrails?: None Help needed moving from lying on your back to sitting on the side of a flat bed without using bedrails?: None Help needed moving to and from a bed to a chair (including a wheelchair)?: None Help needed standing up from a chair using your arms (e.g., wheelchair or bedside chair)?: None Help needed to walk in hospital room?: None Help needed climbing 3-5 steps with a railing? : A Little 6 Click Score: 23    End of Session   Activity Tolerance: Patient tolerated treatment well Patient left: in chair;with call bell/phone within reach;with family/visitor present Nurse Communication: Mobility status PT Visit Diagnosis: History of falling (Z91.81)    Time: 0109-3235 PT Time Calculation (min) (ACUTE ONLY): 18 min   Charges:   PT Evaluation $PT Eval Low Complexity: 1 Low         Windell Norfolk, DPT, PN2   Supplemental Physical Therapist [place]    Pager [phone] Acute Rehab Office [phone]

## 2021-03-25 NOTE — Progress Notes (Signed)
PROGRESS NOTE                                                                                                                                                                                                             Patient Demographics:    Katie Mcneil, is a 70 y.o. female, DOB - [birth year], TCY:[id number]  Outpatient Primary MD for the patient is [name], [place]    LOS - 1  Admit date - 03/23/2021    Chief Complaint  Patient presents with   Cough       Brief Narrative (HPI from H&P)  Katie Mcneil is a 70 y.o. female with medical history significant of seizure disorder. Presents with complaints of shortness of breath.  Have low-grade fever as well as cough ongoing for almost 10 days, CT scan showed possible atypical pneumonia with diffuse lymphadenopathy in the chest, she had a similar CT scan few weeks ago and apparently was supposed to see general surgery outpatient and have an outpatient PET scan.  However she was not doing well earlier.   Subjective:    Katie Mcneil today has, No headache, No chest pain, No abdominal pain - No Nausea, No new weakness tingling or numbness, +ve cough and mild SOB.   Assessment  & Plan :     Fever, night sweats, cough and shortness of breath with diffuse chest lymphadenopathy on CT scan - she could have atypical pneumonia and is on ABX for it.    2.  Diffuse Chest lymphadenopathy, low grade fevers and night sweats.  LDH is mildly elevated but ESR and CRP are significantly elevated, underlying malignancy cannot be ruled out, peripheral smear has been ordered, CT-guided lymph node biopsy has been requested by IR and I have requested hematology to evaluate her as well.  3.  History of seizures.  Stable on Lamictal.  4.  Mild chronic normocytic anemia.  Monitor.        Condition - Fair  Family Communication  :  none present  Code Status :  Full  Consults  :   Onco  PUD Prophylaxis :    Procedures  :     CT guided Node biopsy ordered  CT Chest - Mediastinal, bilateral hilar and bilateral axillary as well as upper abdominal adenopathy, stable since prior study. Findings are concerning for lymphoma  or metastatic disease. Recommend clinical correlation.   New bilateral airspace disease, left greater than right concerning for multifocal pneumonia. Small left pleural effusion. No evidence of pulmonary embolus.  Coronary artery disease.  Aortic Atherosclerosis      Disposition Plan  :    Status is: Inpatient  Remains inpatient appropriate because:IV treatments appropriate due to intensity of illness or inability to take PO  Dispo: The patient is from: Home              Anticipated d/c is to: Home              Patient currently is not medically stable to d/c.   Difficult to place patient No    DVT Prophylaxis  :    enoxaparin (LOVENOX) injection 40 mg Start: 03/24/21 1930    Lab Results  Component Value Date   PLT 411 (H) 03/25/2021    Diet :  Diet Order             Diet regular Room service appropriate? Yes; Fluid consistency: Thin  Diet effective now                    Inpatient Medications  Scheduled Meds:  azithromycin  500 mg Oral q1800   enoxaparin (LOVENOX) injection  40 mg Subcutaneous Q24H   guaiFENesin  600 mg Oral BID   LamoTRIgine  300 mg Oral Daily   Continuous Infusions:  sodium chloride 250 mL (03/23/21 1733)   cefTRIAXone (ROCEPHIN)  IV Stopped (03/24/21 1808)   lactated ringers 75 mL/hr at 03/25/21 0921   PRN Meds:.sodium chloride, acetaminophen **OR** acetaminophen, albuterol, ondansetron **OR** ondansetron (ZOFRAN) IV, senna-docusate  Antibiotics  :    Anti-infectives (From admission, onward)    Start     Dose/Rate Route Frequency Ordered Stop   03/25/21 1800  azithromycin (ZITHROMAX) tablet 500 mg        500 mg Oral Daily-1800 03/25/21 0837 03/28/21 1759   03/23/21 1645  cefTRIAXone  (ROCEPHIN) 2 g in sodium chloride 0.9 % 100 mL IVPB        2 g 200 mL/hr over 30 Minutes Intravenous Every 24 hours 03/23/21 1641 03/28/21 1644   03/23/21 1645  azithromycin (ZITHROMAX) 500 mg in sodium chloride 0.9 % 250 mL IVPB  Status:  Discontinued        500 mg 250 mL/hr over 60 Minutes Intravenous Every 24 hours 03/23/21 1641 03/25/21 0837        Time Spent in minutes  30   Lala Lund M.D on 03/25/2021 at 11:36 AM  To page go to [email or web address]   [facility] -  Office  [phone]  See all Orders from today for further details    Objective:   Vitals:   03/24/21 2310 03/25/21 0344 03/25/21 0500 03/25/21 0744  BP: (!) 124/58 95/81  137/70  Pulse: 87 87  78  Resp: 20 20  (!) 22  Temp: 99.8 F (37.7 C)   99.3 F (37.4 C)  TempSrc: Oral   Oral  SpO2: 95% 94%  93%  Weight:   73.2 kg   Height:        Wt Readings from Last 3 Encounters:  03/25/21 73.2 kg  03/23/21 72.3 kg  03/17/21 73 kg     Intake/Output Summary (Last 24 hours) at 03/25/2021 1136 Last data filed at 03/25/2021 1102 Gross per 24 hour  Intake 1096.32 ml  Output 200 ml  Net 896.32 ml  Physical Exam  Awake Alert, No new F.N deficits, Normal affect [place].AT,PERRAL Supple Neck, No JVD,   Symmetrical Chest wall movement, Good air movement bilaterally, CTAB RRR,No Gallops,Rubs or new Murmurs,   +ve B.Sounds, Abd Soft, No tenderness,   No Cyanosis, Clubbing or edema,        Data Review:    CBC Recent Labs  Lab 03/23/21 1530 03/25/21 0450  WBC 17.4* 16.1*  HGB 10.4* 9.5*  HCT 33.0* 29.6*  PLT 387 411*  MCV 88.0 88.1  MCH 27.7 28.3  MCHC 31.5 32.1  RDW 13.4 13.2  LYMPHSABS 3.0 3.6  MONOABS 1.6* 1.5*  EOSABS 0.4 0.7*  BASOSABS 0.1 0.1    Recent Labs  Lab 03/23/21 1530 03/23/21 1719 03/23/21 1920 03/24/21 1923 03/25/21 0450  NA 137  --   --   --  137  K 3.7  --   --   --  3.5  CL 102  --   --   --  102  CO2 23  --   --   --  23  GLUCOSE 96  --   --   --  89   BUN 9  --   --   --  9  CREATININE 0.64  --   --   --  0.60  CALCIUM 9.6  --   --   --  8.8*  AST 31  --   --   --   --   ALT 39  --   --   --   --   ALKPHOS 123  --   --   --   --   BILITOT 0.5  --   --   --   --   ALBUMIN 4.0  --   --   --   --   MG  --   --   --   --  2.1  CRP  --   --   --  20.7* 18.6*  PROCALCITON  --   --   --  <0.10 <0.10  LATICACIDVEN  --  0.9 0.5  --   --   INR 1.1  --   --   --   --   TSH  --   --   --  1.721  --   BNP 75.3  --   --  77.5  --     ------------------------------------------------------------------------------------------------------------------ No results for input(s): CHOL, HDL, LDLCALC, TRIG, CHOLHDL, LDLDIRECT in the last 72 hours.  No results found for: HGBA1C ------------------------------------------------------------------------------------------------------------------ Recent Labs    03/24/21 1923  TSH 1.721    Cardiac Enzymes No results for input(s): CKMB, TROPONINI, MYOGLOBIN in the last 168 hours.  Invalid input(s): CK ------------------------------------------------------------------------------------------------------------------    Component Value Date/Time   BNP 77.5 03/24/2021 1923      Radiology Reports DG Chest 2 View  Result Date: 03/23/2021 CLINICAL DATA:  sob EXAM: CHEST - 2 VIEW COMPARISON:  Chest radiograph December 20, 2014. chest CT March 14, 2021. FINDINGS: Patchy opacities in the left lung and right upper lobe. No visible pleural effusions or pneumothorax. Cardiomediastinal silhouette is within normal limits. No acute osseous abnormality. Mild reverse S-shaped thoracolumbar curvature. IMPRESSION: Patchy opacities in the left lung and right upper lobe, concerning for multifocal pneumonia. Recommend imaging follow-up to ensure resolution. Electronically Signed   By: Margaretha Sheffield M.D.   On: 03/23/2021 16:04   CT Angio Chest PE W and/or Wo Contrast  Result Date: 03/23/2021 CLINICAL DATA:  Fever,  cough, shortness of breath EXAM: CT ANGIOGRAPHY CHEST WITH CONTRAST TECHNIQUE: Multidetector CT imaging of the chest was performed using the standard protocol during bolus administration of intravenous contrast. Multiplanar CT image reconstructions and MIPs were obtained to evaluate the vascular anatomy. CONTRAST:  58m OMNIPAQUE IOHEXOL 350 MG/ML SOLN COMPARISON:  03/14/2021 FINDINGS: Cardiovascular: No filling defects in the pulmonary arteries to suggest pulmonary emboli. Heart is normal size. Aorta is normal caliber. Scattered coronary artery calcifications and aortic calcifications. Mediastinum/Nodes: There is mediastinal, bilateral hilar and bilateral axillary adenopathy again noted, stable since prior study. Trachea and esophagus are unremarkable. Lungs/Pleura: New bilateral ground-glass airspace opacities noted with tree-in-bud nodular densities also seen bilaterally. Findings are more pronounced on the left. Findings likely reflect multifocal pneumonia. Atypical/viral pneumonia possible. Small left pleural effusion. Upper Abdomen: Upper abdominal/retroperitoneal adenopathy again noted, unchanged since prior study. Musculoskeletal: Chest wall soft tissues are unremarkable. No acute bony abnormality. Review of the MIP images confirms the above findings. IMPRESSION: Mediastinal, bilateral hilar and bilateral axillary as well as upper abdominal adenopathy, stable since prior study. Findings are concerning for lymphoma or metastatic disease. Recommend clinical correlation. New bilateral airspace disease, left greater than right concerning for multifocal pneumonia. Small left pleural effusion. No evidence of pulmonary embolus. Coronary artery disease. Aortic Atherosclerosis (ICD10-I70.0). Electronically Signed   By: KRolm BaptiseM.D.   On: 03/23/2021 18:37   CT Angio Chest PE W and/or Wo Contrast  Result Date: 03/14/2021 CLINICAL DATA:  High probability for PE. Cough and shortness of breath. EXAM: CT  ANGIOGRAPHY CHEST WITH CONTRAST TECHNIQUE: Multidetector CT imaging of the chest was performed using the standard protocol during bolus administration of intravenous contrast. Multiplanar CT image reconstructions and MIPs were obtained to evaluate the vascular anatomy. CONTRAST:  858mOMNIPAQUE IOHEXOL 350 MG/ML SOLN COMPARISON:  None. FINDINGS: Cardiovascular: Satisfactory opacification of the pulmonary arteries to the segmental level. No evidence of pulmonary embolism. Normal heart size. No pericardial effusion. Mediastinum/Nodes: There is an indeterminate 11 mm right thyroid nodule. There is an enlarged subcarinal lymph node measuring 13 mm short axis. There are enlarged bilateral hilar lymph nodes measuring up to 15 mm short axis. There also enlarged bilateral axillary lymph nodes. The largest lymph node on the left measures 1.8 by 3.7 cm in the largest lymph node on the right measures 1.3 by 2.0 cm. The esophagus is nondilated. Lungs/Pleura: Lungs are clear. No pleural effusion or pneumothorax. Upper Abdomen: There is likely adenopathy in the periaortic region image 4/136 measuring 3.7 x 1.4 cm. Musculoskeletal: No chest wall abnormality. No acute or significant osseous findings. Review of the MIP images confirms the above findings. IMPRESSION: 1. No evidence for pulmonary embolism. 2. Mediastinal, hilar, bilateral axillary and upper abdominal lymphadenopathy of uncertain etiology. Findings may be related to lymphoma or metastatic disease. Recommend clinical correlation and follow-up. 3. 1.1 cm incidental right thyroid nodule. No follow-up imaging is recommended. Reference: J Am Coll Radiol. 2015 Feb;12(2): 143-50 Electronically Signed   By: AmRonney Asters.D.   On: 03/14/2021 23:46

## 2021-03-25 NOTE — Progress Notes (Signed)
Lower extremity venous has been completed.   Preliminary results in CV Proc.   Katie Mcneil 03/25/2021 11:47 AM

## 2021-03-26 ENCOUNTER — Inpatient Hospital Stay (HOSPITAL_COMMUNITY): Payer: BC Managed Care – PPO

## 2021-03-26 ENCOUNTER — Encounter (HOSPITAL_COMMUNITY): Payer: Self-pay | Admitting: Internal Medicine

## 2021-03-26 LAB — C-REACTIVE PROTEIN: CRP: 19.8 mg/dL — ABNORMAL HIGH (ref <1.0)

## 2021-03-26 LAB — BRAIN NATRIURETIC PEPTIDE: B Natriuretic Peptide: 55.5 pg/mL (ref 0.0–100.0)

## 2021-03-26 LAB — COMPREHENSIVE METABOLIC PANEL
ALT: 37 U/L (ref 0–44)
AST: 39 U/L (ref 15–41)
Albumin: 2.7 g/dL — ABNORMAL LOW (ref 3.5–5.0)
Alkaline Phosphatase: 126 U/L (ref 38–126)
Anion gap: 9 (ref 5–15)
BUN: 9 mg/dL (ref 8–23)
CO2: 22 mmol/L (ref 22–32)
Calcium: 8.5 mg/dL — ABNORMAL LOW (ref 8.9–10.3)
Chloride: 104 mmol/L (ref 98–111)
Creatinine, Ser: 0.66 mg/dL (ref 0.44–1.00)
GFR, Estimated: >60 mL/min (ref >60)
Glucose, Bld: 112 mg/dL — ABNORMAL HIGH (ref 70–99)
Potassium: 3.6 mmol/L (ref 3.5–5.1)
Sodium: 135 mmol/L (ref 135–145)
Total Bilirubin: 0.7 mg/dL (ref 0.3–1.2)
Total Protein: 6.2 g/dL — ABNORMAL LOW (ref 6.5–8.1)

## 2021-03-26 LAB — SEDIMENTATION RATE: Sed Rate: 91 mm/hr — ABNORMAL HIGH (ref 0–22)

## 2021-03-26 LAB — CBC WITH DIFFERENTIAL/PLATELET
Abs Immature Granulocytes: 0.24 10*3/uL — ABNORMAL HIGH (ref 0.00–0.07)
Basophils Absolute: 0.1 10*3/uL (ref 0.0–0.1)
Basophils Relative: 0 %
Eosinophils Absolute: 0.5 10*3/uL (ref 0.0–0.5)
Eosinophils Relative: 3 %
HCT: 28.2 % — ABNORMAL LOW (ref 36.0–46.0)
Hemoglobin: 8.5 g/dL — ABNORMAL LOW (ref 12.0–15.0)
Immature Granulocytes: 1 %
Lymphocytes Relative: 23 %
Lymphs Abs: 4.2 10*3/uL — ABNORMAL HIGH (ref 0.7–4.0)
MCH: 27.5 pg (ref 26.0–34.0)
MCHC: 30.1 g/dL (ref 30.0–36.0)
MCV: 91.3 fL (ref 80.0–100.0)
Monocytes Absolute: 2 10*3/uL — ABNORMAL HIGH (ref 0.1–1.0)
Monocytes Relative: 11 %
Neutro Abs: 11 10*3/uL — ABNORMAL HIGH (ref 1.7–7.7)
Neutrophils Relative %: 62 %
Platelets: 399 10*3/uL (ref 150–400)
RBC: 3.09 MIL/uL — ABNORMAL LOW (ref 3.87–5.11)
RDW: 13.4 % (ref 11.5–15.5)
WBC: 18.1 10*3/uL — ABNORMAL HIGH (ref 4.0–10.5)
nRBC: 0.1 % (ref 0.0–0.2)

## 2021-03-26 LAB — PROCALCITONIN: Procalcitonin: <0.1 ng/mL

## 2021-03-26 LAB — MAGNESIUM: Magnesium: 2.2 mg/dL (ref 1.7–2.4)

## 2021-03-26 LAB — ANA W/REFLEX IF POSITIVE: Anti Nuclear Antibody (ANA): NEGATIVE

## 2021-03-26 MED ORDER — MIDAZOLAM HCL 2 MG/2ML IJ SOLN
INTRAMUSCULAR | Status: AC
  Filled 2021-03-26: qty 2

## 2021-03-26 MED ORDER — LIDOCAINE HCL (PF) 1 % IJ SOLN
INTRAMUSCULAR | Status: AC
  Filled 2021-03-26: qty 30

## 2021-03-26 MED ORDER — FENTANYL CITRATE (PF) 100 MCG/2ML IJ SOLN
INTRAMUSCULAR | Status: AC
  Filled 2021-03-26: qty 2

## 2021-03-26 MED ORDER — FENTANYL CITRATE (PF) 100 MCG/2ML IJ SOLN
INTRAMUSCULAR | Status: DC | PRN
  Administered 2021-03-26: 25 ug via INTRAVENOUS

## 2021-03-26 MED ORDER — BENZONATATE 100 MG PO CAPS
100.0000 mg | ORAL_CAPSULE | Freq: Three times a day (TID) | ORAL | Status: DC | PRN
  Administered 2021-03-27: 100 mg via ORAL
  Filled 2021-03-26: qty 1

## 2021-03-26 MED ORDER — MIDAZOLAM HCL 2 MG/2ML IJ SOLN
INTRAMUSCULAR | Status: DC | PRN
Start: 2021-03-26 — End: 2021-03-28
  Administered 2021-03-26: 1 mg via INTRAVENOUS

## 2021-03-26 NOTE — Procedures (Signed)
  Procedure: [place] core biopsy R inguinal LAN   EBL:   minimal Complications:  none immediate  See full dictation in [facility].  Dillard Cannon MD Main # [phone] Pager  [phone]

## 2021-03-26 NOTE — Progress Notes (Signed)
   03/25/21 2305 03/26/21 0000  Assess: MEWS Score  Temp (!) 102.1 F (38.9 C) 99.4 F (37.4 C)  BP 128/69 124/72  Pulse Rate 90 81  Resp 18 18  SpO2  --  94 %  O2 Device  --  Room Air  Assess: MEWS Score  MEWS Temp 2 0  MEWS Systolic 0 0  MEWS Pulse 0 0  MEWS RR 0 0  MEWS LOC 0 0  MEWS Score 2 0  MEWS Score Color Yellow Green  Assess: if the MEWS score is Yellow or Red  MEWS guidelines implemented *See Row Information*  --  No, vital signs rechecked

## 2021-03-26 NOTE — H&P (Addendum)
Chief Complaint: Patient was seen in consultation today for image guided lymph node biopsy Chief Complaint  Patient presents with   Cough   at the request of Singh, P.  Referring Physician(s): Singh, P.  Supervising Physician: Arne Cleveland  Patient Status: [facility] - In-pt  History of Present Illness: Katie Mcneil is a 70 y.o. female with PMHs of dyslipidemia, seizure, PE not on AC/AP, came to ED on 10/1 due to shortness of breath. CT chest angio was negative for PE but showed: mediastinal, hilar, bilateral axillary and upper abdominal lymphadenopathy of uncertain etiology. Findings may be related to lymphoma or metastatic disease.  Patient was diagnosed with viral URI and discharged home with f/u with general surgery, patient had an outpatient follow up visit with Dr. Beverely Pace from general surgery at [facility] on 10/7, and she was going to be scheduled for a PET scan for further evaluation of the lymphadenopathies.   She unfortunately presented to [facility] ED again on 10/10 due to worsening shortness of breath, CXR showed findings concerning for multifocal pneumonia, repeat CTA chest showed no pulmonary embolism and stable lymphadenopathies. She was found to have persistent fever and worsening leukocytosis; therefore, patient was admitted and currently being treated for PNA.   IR was requested for image guided lymph node biopsy by [facility] yesterday. Case was reviewed by Dr. Earleen Newport who recommended CT abdomen pelvis with contrast to complete imaging and to find a best target for the lymph node biopsy. Patient underwent CT yesterday, Dr. Earleen Newport approved ultrasound-guided biopsy of a inguinal lymph node.   Patient was seen in [facility] IR holding. Patient laying in bed, not in acute distress.  Denise headache, fever, chills, shortness of breath, cough, chest pain, abdominal pain, nausea ,vomiting, and bleeding.   Past Medical History:  Diagnosis Date   Dyslipidemia    Seizures ([place])     Past  Surgical History:  Procedure Laterality Date   CATARACT EXTRACTION W/ INTRAOCULAR LENS IMPLANT Bilateral 2019   CESAREAN SECTION  1981, 1990   NO PAST SURGERIES     TUBAL LIGATION      Allergies: Patient has no known allergies.  Medications: Prior to Admission medications   Medication Sig Start Date End Date Taking? Authorizing Provider  albuterol (VENTOLIN HFA) 108 (90 Base) MCG/ACT inhaler PLEASE SEE ATTACHED FOR DETAILED DIRECTIONS 03/14/21  Yes [provider]  LamoTRIgine 300 MG TB24 24 hour tablet Take 1 tablet (300 mg total) by mouth daily. 01/14/21  Yes Ward Givens, NP  Omega-3 Fatty Acids (FISH OIL) 1000 MG CAPS Take 1 capsule by mouth daily in the afternoon.   Yes [provider]  Vitamin D, Cholecalciferol, 25 MCG (1000 UT) CAPS Take 1 capsule by mouth daily in the afternoon.   Yes [provider]     Family History  Problem Relation Age of Onset   Neuropathy Mother    Anuerysm Father    Leukemia Sister 69   Alzheimer's disease Sister 53   Dementia Brother    Heart attack Maternal Grandmother    CVA Maternal Grandfather    Dementia Paternal Grandmother    Seizures Daughter    Other Daughter        Psychological disorder   Colon cancer Neg Hx     Social History   Socioeconomic History   Marital status: Widowed    Spouse name: Not on file   Number of children: 2   Years of education: Not on file   Highest education level:  Not on file  Occupational History   Not on file  Tobacco Use   Smoking status: Never   Smokeless tobacco: Never  Vaping Use   Vaping Use: Never used  Substance and Sexual Activity   Alcohol use: Not Currently    Alcohol/week: 0.0 standard drinks   Drug use: No   Sexual activity: Not Currently  Other Topics Concern   Not on file  Social History Narrative   Works in Engineer, 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  All other systems are negative.  Vital Signs: BP (!) 115/56 (BP Location: Left Arm)   Pulse 85   Temp 98.7 F (37.1 C) (Oral)   Resp 17   Ht 5\' 6"  (1.676 m)   Wt 165 lb 5.5 oz (75 kg)   SpO2 96%   BMI 26.69 kg/m   Physical Exam Vitals and nursing note reviewed.  Constitutional:      General: Patient is not in acute distress.    Appearance: Normal appearance. Patient is not ill-appearing.  HENT:     Head: Normocephalic and atraumatic.     Mouth/Throat:     Mouth: Mucous membranes are moist.     Pharynx: Oropharynx is clear.  Cardiovascular:     Rate and Rhythm: Normal rate and regular rhythm.     Pulses: Normal pulses.     Heart sounds: Normal heart sounds.  Pulmonary:     Effort: Pulmonary effort is normal.     Breath sounds: Normal breath sounds.  Abdominal:     General: Abdomen is flat. Bowel sounds are normal.     Palpations: Abdomen is soft.  Musculoskeletal:     Cervical back: Neck supple.  Skin:    General: Skin is warm and dry.     Coloration: Skin is not jaundiced or pale.  Neurological:     Mental Status: Patient is alert and oriented to person, place, and time.  Psychiatric:        Mood and Affect: Mood normal.        Behavior: Behavior normal.        Judgment: Judgment normal.    MD Evaluation Airway: WNL Heart: WNL Abdomen: WNL Chest/ Lungs: WNL ASA  Classification: 3 Mallampati/Airway Score: Two  Imaging: DG Chest 2 View  Result Date: 03/23/2021 CLINICAL DATA:  sob EXAM: CHEST - 2 VIEW COMPARISON:  Chest radiograph December 20, 2014. chest CT March 14, 2021. FINDINGS: Patchy opacities in the left lung and right  upper lobe. No visible pleural effusions or pneumothorax. Cardiomediastinal silhouette is within normal limits. No acute osseous abnormality. Mild reverse S-shaped thoracolumbar curvature. IMPRESSION: Patchy opacities in the left lung and right upper lobe, concerning for multifocal pneumonia. Recommend imaging follow-up to ensure resolution. Electronically Signed   By: Margaretha Sheffield M.D.   On: 03/23/2021 16:04   CT Angio Chest PE W and/or Wo Contrast  Result Date: 03/23/2021 CLINICAL DATA:  Fever, cough, shortness of breath EXAM: CT ANGIOGRAPHY CHEST WITH CONTRAST TECHNIQUE: Multidetector  CT imaging of the chest was performed using the standard protocol during bolus administration of intravenous contrast. Multiplanar CT image reconstructions and MIPs were obtained to evaluate the vascular anatomy. CONTRAST:  26mL OMNIPAQUE IOHEXOL 350 MG/ML SOLN COMPARISON:  03/14/2021 FINDINGS: Cardiovascular: No filling defects in the pulmonary arteries to suggest pulmonary emboli. Heart is normal size. Aorta is normal caliber. Scattered coronary artery calcifications and aortic calcifications. Mediastinum/Nodes: There is mediastinal, bilateral hilar and bilateral axillary adenopathy again noted, stable since prior study. Trachea and esophagus are unremarkable. Lungs/Pleura: New bilateral ground-glass airspace opacities noted with tree-in-bud nodular densities also seen bilaterally. Findings are more pronounced on the left. Findings likely reflect multifocal pneumonia. Atypical/viral pneumonia possible. Small left pleural effusion. Upper Abdomen: Upper abdominal/retroperitoneal adenopathy again noted, unchanged since prior study. Musculoskeletal: Chest wall soft tissues are unremarkable. No acute bony abnormality. Review of the MIP images confirms the above findings. IMPRESSION: Mediastinal, bilateral hilar and bilateral axillary as well as upper abdominal adenopathy, stable since prior study. Findings are concerning for  lymphoma or metastatic disease. Recommend clinical correlation. New bilateral airspace disease, left greater than right concerning for multifocal pneumonia. Small left pleural effusion. No evidence of pulmonary embolus. Coronary artery disease. Aortic Atherosclerosis (ICD10-I70.0). Electronically Signed   By: Rolm Baptise M.D.   On: 03/23/2021 18:37   CT Angio Chest PE W and/or Wo Contrast  Result Date: 03/14/2021 CLINICAL DATA:  High probability for PE. Cough and shortness of breath. EXAM: CT ANGIOGRAPHY CHEST WITH CONTRAST TECHNIQUE: Multidetector CT imaging of the chest was performed using the standard protocol during bolus administration of intravenous contrast. Multiplanar CT image reconstructions and MIPs were obtained to evaluate the vascular anatomy. CONTRAST:  25mL OMNIPAQUE IOHEXOL 350 MG/ML SOLN COMPARISON:  None. FINDINGS: Cardiovascular: Satisfactory opacification of the pulmonary arteries to the segmental level. No evidence of pulmonary embolism. Normal heart size. No pericardial effusion. Mediastinum/Nodes: There is an indeterminate 11 mm right thyroid nodule. There is an enlarged subcarinal lymph node measuring 13 mm short axis. There are enlarged bilateral hilar lymph nodes measuring up to 15 mm short axis. There also enlarged bilateral axillary lymph nodes. The largest lymph node on the left measures 1.8 by 3.7 cm in the largest lymph node on the right measures 1.3 by 2.0 cm. The esophagus is nondilated. Lungs/Pleura: Lungs are clear. No pleural effusion or pneumothorax. Upper Abdomen: There is likely adenopathy in the periaortic region image 4/136 measuring 3.7 x 1.4 cm. Musculoskeletal: No chest wall abnormality. No acute or significant osseous findings. Review of the MIP images confirms the above findings. IMPRESSION: 1. No evidence for pulmonary embolism. 2. Mediastinal, hilar, bilateral axillary and upper abdominal lymphadenopathy of uncertain etiology. Findings may be related to lymphoma  or metastatic disease. Recommend clinical correlation and follow-up. 3. 1.1 cm incidental right thyroid nodule. No follow-up imaging is recommended. Reference: J Am Coll Radiol. 2015 Feb;12(2): 143-50 Electronically Signed   By: Ronney Asters M.D.   On: 03/14/2021 23:46   CT ABDOMEN PELVIS W CONTRAST  Result Date: 03/25/2021 CLINICAL DATA:  Productive cough, increased shortness of breath for several weeks, lymphadenopathy EXAM: CT ABDOMEN AND PELVIS WITH CONTRAST TECHNIQUE: Multidetector CT imaging of the abdomen and pelvis was performed using the standard protocol following bolus administration of intravenous contrast. CONTRAST:  181mL OMNIPAQUE IOHEXOL 300 MG/ML  SOLN COMPARISON:  03/14/2021, 03/23/2021 FINDINGS: Lower chest: Bibasilar airspace disease and small left pleural effusion consistent with multifocal pneumonia. The heart is unremarkable. Hepatobiliary: No focal liver abnormality is seen. No  gallstones, gallbladder wall thickening, or biliary dilatation. Pancreas: Unremarkable. No pancreatic ductal dilatation or surrounding inflammatory changes. Spleen: Normal in size without focal abnormality. Adrenals/Urinary Tract: Adrenal glands are unremarkable. Kidneys are normal, without renal calculi, focal lesion, or hydronephrosis. Bladder is unremarkable. Stomach/Bowel: No bowel obstruction or ileus. Normal appendix right lower quadrant. No bowel wall thickening or inflammatory change. Vascular/Lymphatic: Extensive mesenteric and retroperitoneal lymphadenopathy is identified. Index lymph nodes are as follows: Left para-aortic, image 34/3, 15 x 25 mm. Left external iliac, image 67/3, 21 x 29 mm. Upper abdominal mesentery, image 36/3, 15 x 27 mm. Mild atherosclerosis.  No other significant vascular findings. Reproductive: Calcified uterine fibroid left aspect uterine fundus measuring 1.7 cm. No adnexal masses. Other: No free fluid or free gas.  No abdominal wall hernia. Musculoskeletal: No acute or  destructive bony lesions. Reconstructed images demonstrate no additional findings. IMPRESSION: 1. Extensive lymphadenopathy throughout the abdomen and pelvis. Favor lymphoproliferative disorder over metastatic disease. 2. Bibasilar pneumonia and small left pleural effusion. Please refer to preceding chest CT evaluation. 3.  Aortic Atherosclerosis (ICD10-I70.0). Electronically Signed   By: Randa Ngo M.D.   On: 03/25/2021 15:29   VAS [place] LOWER EXTREMITY VENOUS (DVT)  Result Date: 03/25/2021  Lower Venous DVT Study Patient Name:  Katie Mcneil  Date of Exam:   03/25/2021 Medical Rec #: [id number]       Accession #:    [id number] Date of Birth: 10-27-1950       Patient Gender: F Patient Age:   2 years Exam Location:  [facility] Procedure:      VAS [place] LOWER EXTREMITY VENOUS (DVT) Referring Phys: PRANAV PATEL --------------------------------------------------------------------------------  Indications: Edema.  Comparison Study: no prior Performing Technologist: Archie Patten RVS  Examination Guidelines: A complete evaluation includes B-mode imaging, spectral Doppler, color Doppler, and power Doppler as needed of all accessible portions of each vessel. Bilateral testing is considered an integral part of a complete examination. Limited examinations for reoccurring indications may be performed as noted. The reflux portion of the exam is performed with the patient in reverse Trendelenburg.  +---------+---------------+---------+-----------+----------+--------------+ RIGHT    CompressibilityPhasicitySpontaneityPropertiesThrombus Aging +---------+---------------+---------+-----------+----------+--------------+ CFV      Full           Yes      Yes                                 +---------+---------------+---------+-----------+----------+--------------+ SFJ      Full                                                         +---------+---------------+---------+-----------+----------+--------------+ FV Prox  Full                                                        +---------+---------------+---------+-----------+----------+--------------+ FV Mid   Full                                                        +---------+---------------+---------+-----------+----------+--------------+  FV DistalFull                                                        +---------+---------------+---------+-----------+----------+--------------+ PFV      Full                                                        +---------+---------------+---------+-----------+----------+--------------+ POP      Full           Yes      Yes                                 +---------+---------------+---------+-----------+----------+--------------+ PTV      Full                                                        +---------+---------------+---------+-----------+----------+--------------+ PERO     Full                                                        +---------+---------------+---------+-----------+----------+--------------+   +---------+---------------+---------+-----------+----------+--------------+ LEFT     CompressibilityPhasicitySpontaneityPropertiesThrombus Aging +---------+---------------+---------+-----------+----------+--------------+ CFV      Full           Yes      Yes                                 +---------+---------------+---------+-----------+----------+--------------+ SFJ      Full                                                        +---------+---------------+---------+-----------+----------+--------------+ FV Prox  Full                                                        +---------+---------------+---------+-----------+----------+--------------+ FV Mid   Full                                                         +---------+---------------+---------+-----------+----------+--------------+ FV DistalFull                                                        +---------+---------------+---------+-----------+----------+--------------+  PFV      Full                                                        +---------+---------------+---------+-----------+----------+--------------+ POP      Full           Yes      Yes                                 +---------+---------------+---------+-----------+----------+--------------+ PTV      Full                                                        +---------+---------------+---------+-----------+----------+--------------+ PERO     Full                                                        +---------+---------------+---------+-----------+----------+--------------+     Summary: BILATERAL: - No evidence of deep vein thrombosis seen in the lower extremities, bilaterally. -No evidence of popliteal cyst, bilaterally.   *See table(s) above for measurements and observations. Electronically signed by Harold Barban MD on 03/25/2021 at 9:25:17 PM.    Final     Labs:  CBC: Recent Labs    03/14/21 2155 03/23/21 1530 03/25/21 0450 03/26/21 0134  WBC 13.1* 17.4* 16.1* 18.1*  HGB 10.2* 10.4* 9.5* 8.5*  HCT 33.0* 33.0* 29.6* 28.2*  PLT PLATELET CLUMPS NOTED ON SMEAR, UNABLE TO ESTIMATE 387 411* 399    COAGS: Recent Labs    03/23/21 1530  INR 1.1  APTT 37*    BMP: Recent Labs    03/14/21 2155 03/23/21 1530 03/25/21 0450 03/26/21 0134  NA 138 137 137 135  K 3.6 3.7 3.5 3.6  CL 104 102 102 104  CO2 25 23 23 22   GLUCOSE 120* 96 89 112*  BUN 16 9 9 9   CALCIUM 8.9 9.6 8.8* 8.5*  CREATININE 0.56 0.64 0.60 0.66  GFRNONAA >60 >60 >60 >60    LIVER FUNCTION TESTS: Recent Labs    09/10/20 0958 03/23/21 1530 03/26/21 0134  BILITOT 0.7 0.5 0.7  AST 17 31 39  ALT 11 39 37  ALKPHOS 80 123 126  PROT 6.9 7.9 6.2*  ALBUMIN 4.4 4.0 2.7*     TUMOR MARKERS: No results for input(s): AFPTM, CEA, CA199, CHROMGRNA in the last 8760 hours.  Assessment and Plan: 70 y.o. female with no known history of malignancy, with recent finding of extensive lymphadenopathy throughout the chest, abdomen, and pelvis concerning for lymphoproliferative disorder or metastatic disease.   IR was requested for image guided lymph node biopsy. Case was reviewed and approved for ultrasound-guided biopsy of a inguinal lymph node by Dr. Earleen Newport.  Patient was brought down to [facility] IR today for the procedure. N.p.o. since midnight VSS Persistent, fluctuating leukocytosis with WBC 18.1 today, persistent anemia hemoglobin 8.5 slightly lower from her baseline which is in 10s.  PLT 399 On Lovenox 40  milligrams subcu, last dose given at 2105 hrs. on 04/02/2021  Risks and benefits of inguinal lymph node biopsy was discussed with the patient and/or patient's family including, but not limited to bleeding, infection, damage to adjacent structures or low yield requiring additional tests.  All of the questions were answered and there is agreement to proceed.  Consent signed and in chart.   Thank you for this interesting consult.  I greatly enjoyed meeting Katie Mcneil and look forward to participating in their care.  A copy of this report was sent to the requesting provider on this date.  Electronically Signed: Tera Mater, PA-C 03/26/2021, 8:49 AM   I spent a total of 40 Minutes    in face to face in clinical consultation, greater than 50% of which was counseling/coordinating care for image guided inguinal lymph node biopsy.   This chart was dictated using voice recognition software.  Despite best efforts to proofread,  errors can occur which can change the documentation meaning.

## 2021-03-26 NOTE — Progress Notes (Signed)
   03/25/21 2305  Assess: MEWS Score  Temp (!) 102.1 F (38.9 C)  BP 128/69  Pulse Rate 90  Resp 18  SpO2 93 %  O2 Device Room Air  Assess: MEWS Score  MEWS Temp 2  MEWS Systolic 0  MEWS Pulse 0  MEWS RR 0  MEWS LOC 0  MEWS Score 2  MEWS Score Color Yellow  Assess: if the MEWS score is Yellow or Red  Were vital signs taken at a resting state? Yes  Focused Assessment No change from prior assessment  Early Detection of Sepsis Score *See Row Information* Low  MEWS guidelines implemented *See Row Information* Yes  Treat  MEWS Interventions Administered prn meds/treatments  Pain Scale 0-10  Pain Score 0  Take Vital Signs  Increase Vital Sign Frequency  Yellow: Q 2hr X 2 then Q 4hr X 2, if remains yellow, continue Q 4hrs  Escalate  MEWS: Escalate Yellow: discuss with charge nurse/RN and consider discussing with provider and RRT  Notify: Charge Nurse/RN  Name of Charge Nurse/RN Notified Elmyra Ricks  Date Charge Nurse/RN Notified 03/25/21  Time Charge Nurse/RN Notified 2326  Notify: Provider  Provider Name/Title on call provider  Date Provider Notified 03/25/21  Time Provider Notified 2334  Notification Type Page  Document  Patient Outcome Other (Comment) (evaluation continued)

## 2021-03-26 NOTE — Progress Notes (Signed)
PROGRESS NOTE                                                                                                                                                                                                             Patient Demographics:    Katie Mcneil, is a 70 y.o. female, DOB - [birth year], QJF:[id number]  Outpatient Primary MD for the patient is [name], [place]    LOS - 2  Admit date - 03/23/2021    Chief Complaint  Patient presents with   Cough       Brief Narrative (HPI from H&P)  Katie Mcneil is a 70 y.o. female with medical history significant of seizure disorder. Presents with complaints of shortness of breath.  Have low-grade fever as well as cough ongoing for almost 10 days, CT scan showed possible atypical pneumonia with diffuse lymphadenopathy in the chest, she had a similar CT scan few weeks ago and apparently was supposed to see general surgery outpatient and have an outpatient PET scan.  However she was not doing well earlier.   Subjective:   Patient in bed, appears comfortable, denies any headache, no fever, no chest pain or pressure, no shortness of breath , no abdominal pain. No new focal weakness.    Assessment  & Plan :     Fever, night sweats, cough and shortness of breath with diffuse chest lymphadenopathy on CT scan - she could have atypical pneumonia and is on ABX for it.  2.  Diffuse Chest lymphadenopathy, low grade fevers and night sweats.  LDH is mildly elevated but ESR and CRP are significantly elevated, underlying malignancy cannot be ruled out, peripheral smear has been ordered, CT-guided lymph node biopsy has been requested by IR and was done 03/26/21 - results pending, Haem following as well.    3.  History of seizures.  Stable on Lamictal.  4.  Mild chronic normocytic anemia.  Monitor.        Condition - Fair  Family Communication  :  none present  Code Status :   Full  Consults  :  Onco  PUD Prophylaxis :    Procedures  :     CT guided Node biopsy done 03/26/21  CT Abd  - Pelvis - 1. Extensive lymphadenopathy throughout the abdomen and pelvis. Favor lymphoproliferative disorder over metastatic disease. 2.  Bibasilar pneumonia and small left pleural effusion. Please refer to preceding chest CT evaluation. 3.  Aortic Atherosclerosis (ICD10-I70.0).  CT Chest - Mediastinal, bilateral hilar and bilateral axillary as well as upper abdominal adenopathy, stable since prior study. Findings are concerning for lymphoma or metastatic disease. Recommend clinical correlation.   New bilateral airspace disease, left greater than right concerning for multifocal pneumonia. Small left pleural effusion. No evidence of pulmonary embolus.  Coronary artery disease.  Aortic Atherosclerosis      Disposition Plan  :    Status is: Inpatient  Remains inpatient appropriate because:IV treatments appropriate due to intensity of illness or inability to take PO  Dispo: The patient is from: Home              Anticipated d/c is to: Home              Patient currently is not medically stable to d/c.   Difficult to place patient No    DVT Prophylaxis  :    enoxaparin (LOVENOX) injection 40 mg Start: 03/24/21 1930    Lab Results  Component Value Date   PLT 399 03/26/2021    Diet :  Diet Order             Diet NPO time specified Except for: Sips with Meds  Diet effective midnight                    Inpatient Medications  Scheduled Meds:  azithromycin  500 mg Oral q1800   enoxaparin (LOVENOX) injection  40 mg Subcutaneous Q24H   fentaNYL       guaiFENesin  600 mg Oral BID   LamoTRIgine  300 mg Oral Daily   lidocaine (PF)       midazolam       Continuous Infusions:  sodium chloride 250 mL (03/23/21 1733)   cefTRIAXone (ROCEPHIN)  IV Stopped (03/25/21 1736)   PRN Meds:.sodium chloride, acetaminophen **OR** acetaminophen, albuterol, fentaNYL,  midazolam, ondansetron **OR** ondansetron (ZOFRAN) IV, senna-docusate  Antibiotics  :    Anti-infectives (From admission, onward)    Start     Dose/Rate Route Frequency Ordered Stop   03/25/21 1800  azithromycin (ZITHROMAX) tablet 500 mg        500 mg Oral Daily-1800 03/25/21 0837 03/28/21 1759   03/23/21 1645  cefTRIAXone (ROCEPHIN) 2 g in sodium chloride 0.9 % 100 mL IVPB        2 g 200 mL/hr over 30 Minutes Intravenous Every 24 hours 03/23/21 1641 03/28/21 1644   03/23/21 1645  azithromycin (ZITHROMAX) 500 mg in sodium chloride 0.9 % 250 mL IVPB  Status:  Discontinued        500 mg 250 mL/hr over 60 Minutes Intravenous Every 24 hours 03/23/21 1641 03/25/21 0837        Time Spent in minutes  30   Lala Lund M.D on 03/26/2021 at 12:11 PM  To page go to [email or web address]   [facility] -  Office  [phone]  See all Orders from today for further details    Objective:   Vitals:   03/26/21 0904 03/26/21 0950 03/26/21 0955 03/26/21 1010  BP: 119/60 (!) 120/59 (!) 106/55 (!) 110/56  Pulse: 70 70 67 73  Resp: 16 (!) 23 (!) 21 (!) 21  Temp:      TempSrc:      SpO2: 95% 100% 99% 94%  Weight:      Height:        Wt  Readings from Last 3 Encounters:  03/26/21 75 kg  03/23/21 72.3 kg  03/17/21 73 kg     Intake/Output Summary (Last 24 hours) at 03/26/2021 1211 Last data filed at 03/25/2021 1500 Gross per 24 hour  Intake 400 ml  Output --  Net 400 ml     Physical Exam  Awake Alert, No new F.N deficits, Normal affect [place].AT,PERRAL Supple Neck, No JVD,   Symmetrical Chest wall movement, Good air movement bilaterally, CTAB RRR,No Gallops, Rubs or new Murmurs,  +ve B.Sounds, Abd Soft, No tenderness,   No Cyanosis, Clubbing or edema,         Data Review:    CBC Recent Labs  Lab 03/23/21 1530 03/25/21 0450 03/26/21 0134  WBC 17.4* 16.1* 18.1*  HGB 10.4* 9.5* 8.5*  HCT 33.0* 29.6* 28.2*  PLT 387 411* 399  MCV 88.0 88.1 91.3  MCH 27.7 28.3 27.5   MCHC 31.5 32.1 30.1  RDW 13.4 13.2 13.4  LYMPHSABS 3.0 3.6 4.2*  MONOABS 1.6* 1.5* 2.0*  EOSABS 0.4 0.7* 0.5  BASOSABS 0.1 0.1 0.1    Recent Labs  Lab 03/23/21 1530 03/23/21 1719 03/23/21 1920 03/24/21 1923 03/25/21 0450 03/26/21 0134  NA 137  --   --   --  137 135  K 3.7  --   --   --  3.5 3.6  CL 102  --   --   --  102 104  CO2 23  --   --   --  23 22  GLUCOSE 96  --   --   --  89 112*  BUN 9  --   --   --  9 9  CREATININE 0.64  --   --   --  0.60 0.66  CALCIUM 9.6  --   --   --  8.8* 8.5*  AST 31  --   --   --   --  39  ALT 39  --   --   --   --  37  ALKPHOS 123  --   --   --   --  126  BILITOT 0.5  --   --   --   --  0.7  ALBUMIN 4.0  --   --   --   --  2.7*  MG  --   --   --   --  2.1 2.2  CRP  --   --   --  20.7* 18.6* 19.8*  PROCALCITON  --   --   --  <0.10 <0.10 <0.10  LATICACIDVEN  --  0.9 0.5  --   --   --   INR 1.1  --   --   --   --   --   TSH  --   --   --  1.721  --   --   BNP 75.3  --   --  77.5  --  55.5    ------------------------------------------------------------------------------------------------------------------ Recent Labs    03/25/21 1502  TRIG 107    No results found for: HGBA1C ------------------------------------------------------------------------------------------------------------------ Recent Labs    03/24/21 1923  TSH 1.721    Cardiac Enzymes No results for input(s): CKMB, TROPONINI, MYOGLOBIN in the last 168 hours.  Invalid input(s): CK ------------------------------------------------------------------------------------------------------------------    Component Value Date/Time   BNP 55.5 03/26/2021 0134      Radiology Reports DG Chest 2 View  Result Date: 03/23/2021 CLINICAL DATA:  sob EXAM: CHEST - 2 VIEW COMPARISON:  Chest radiograph December 20, 2014. chest CT March 14, 2021. FINDINGS: Patchy opacities in the left lung and right upper lobe. No visible pleural effusions or pneumothorax. Cardiomediastinal  silhouette is within normal limits. No acute osseous abnormality. Mild reverse S-shaped thoracolumbar curvature. IMPRESSION: Patchy opacities in the left lung and right upper lobe, concerning for multifocal pneumonia. Recommend imaging follow-up to ensure resolution. Electronically Signed   By: Margaretha Sheffield M.D.   On: 03/23/2021 16:04   CT Angio Chest PE W and/or Wo Contrast  Result Date: 03/23/2021 CLINICAL DATA:  Fever, cough, shortness of breath EXAM: CT ANGIOGRAPHY CHEST WITH CONTRAST TECHNIQUE: Multidetector CT imaging of the chest was performed using the standard protocol during bolus administration of intravenous contrast. Multiplanar CT image reconstructions and MIPs were obtained to evaluate the vascular anatomy. CONTRAST:  27mL OMNIPAQUE IOHEXOL 350 MG/ML SOLN COMPARISON:  03/14/2021 FINDINGS: Cardiovascular: No filling defects in the pulmonary arteries to suggest pulmonary emboli. Heart is normal size. Aorta is normal caliber. Scattered coronary artery calcifications and aortic calcifications. Mediastinum/Nodes: There is mediastinal, bilateral hilar and bilateral axillary adenopathy again noted, stable since prior study. Trachea and esophagus are unremarkable. Lungs/Pleura: New bilateral ground-glass airspace opacities noted with tree-in-bud nodular densities also seen bilaterally. Findings are more pronounced on the left. Findings likely reflect multifocal pneumonia. Atypical/viral pneumonia possible. Small left pleural effusion. Upper Abdomen: Upper abdominal/retroperitoneal adenopathy again noted, unchanged since prior study. Musculoskeletal: Chest wall soft tissues are unremarkable. No acute bony abnormality. Review of the MIP images confirms the above findings. IMPRESSION: Mediastinal, bilateral hilar and bilateral axillary as well as upper abdominal adenopathy, stable since prior study. Findings are concerning for lymphoma or metastatic disease. Recommend clinical correlation. New  bilateral airspace disease, left greater than right concerning for multifocal pneumonia. Small left pleural effusion. No evidence of pulmonary embolus. Coronary artery disease. Aortic Atherosclerosis (ICD10-I70.0). Electronically Signed   By: Rolm Baptise M.D.   On: 03/23/2021 18:37   CT Angio Chest PE W and/or Wo Contrast  Result Date: 03/14/2021 CLINICAL DATA:  High probability for PE. Cough and shortness of breath. EXAM: CT ANGIOGRAPHY CHEST WITH CONTRAST TECHNIQUE: Multidetector CT imaging of the chest was performed using the standard protocol during bolus administration of intravenous contrast. Multiplanar CT image reconstructions and MIPs were obtained to evaluate the vascular anatomy. CONTRAST:  85mL OMNIPAQUE IOHEXOL 350 MG/ML SOLN COMPARISON:  None. FINDINGS: Cardiovascular: Satisfactory opacification of the pulmonary arteries to the segmental level. No evidence of pulmonary embolism. Normal heart size. No pericardial effusion. Mediastinum/Nodes: There is an indeterminate 11 mm right thyroid nodule. There is an enlarged subcarinal lymph node measuring 13 mm short axis. There are enlarged bilateral hilar lymph nodes measuring up to 15 mm short axis. There also enlarged bilateral axillary lymph nodes. The largest lymph node on the left measures 1.8 by 3.7 cm in the largest lymph node on the right measures 1.3 by 2.0 cm. The esophagus is nondilated. Lungs/Pleura: Lungs are clear. No pleural effusion or pneumothorax. Upper Abdomen: There is likely adenopathy in the periaortic region image 4/136 measuring 3.7 x 1.4 cm. Musculoskeletal: No chest wall abnormality. No acute or significant osseous findings. Review of the MIP images confirms the above findings. IMPRESSION: 1. No evidence for pulmonary embolism. 2. Mediastinal, hilar, bilateral axillary and upper abdominal lymphadenopathy of uncertain etiology. Findings may be related to lymphoma or metastatic disease. Recommend clinical correlation and follow-up.  3. 1.1 cm incidental right thyroid nodule. No follow-up imaging is recommended. Reference: J Am Coll Radiol. 2015  AVW;97(9): 143-50 Electronically Signed   By: Ronney Asters M.D.   On: 03/14/2021 23:46   CT ABDOMEN PELVIS W CONTRAST  Result Date: 03/25/2021 CLINICAL DATA:  Productive cough, increased shortness of breath for several weeks, lymphadenopathy EXAM: CT ABDOMEN AND PELVIS WITH CONTRAST TECHNIQUE: Multidetector CT imaging of the abdomen and pelvis was performed using the standard protocol following bolus administration of intravenous contrast. CONTRAST:  140mL OMNIPAQUE IOHEXOL 300 MG/ML  SOLN COMPARISON:  03/14/2021, 03/23/2021 FINDINGS: Lower chest: Bibasilar airspace disease and small left pleural effusion consistent with multifocal pneumonia. The heart is unremarkable. Hepatobiliary: No focal liver abnormality is seen. No gallstones, gallbladder wall thickening, or biliary dilatation. Pancreas: Unremarkable. No pancreatic ductal dilatation or surrounding inflammatory changes. Spleen: Normal in size without focal abnormality. Adrenals/Urinary Tract: Adrenal glands are unremarkable. Kidneys are normal, without renal calculi, focal lesion, or hydronephrosis. Bladder is unremarkable. Stomach/Bowel: No bowel obstruction or ileus. Normal appendix right lower quadrant. No bowel wall thickening or inflammatory change. Vascular/Lymphatic: Extensive mesenteric and retroperitoneal lymphadenopathy is identified. Index lymph nodes are as follows: Left para-aortic, image 34/3, 15 x 25 mm. Left external iliac, image 67/3, 21 x 29 mm. Upper abdominal mesentery, image 36/3, 15 x 27 mm. Mild atherosclerosis.  No other significant vascular findings. Reproductive: Calcified uterine fibroid left aspect uterine fundus measuring 1.7 cm. No adnexal masses. Other: No free fluid or free gas.  No abdominal wall hernia. Musculoskeletal: No acute or destructive bony lesions. Reconstructed images demonstrate no additional  findings. IMPRESSION: 1. Extensive lymphadenopathy throughout the abdomen and pelvis. Favor lymphoproliferative disorder over metastatic disease. 2. Bibasilar pneumonia and small left pleural effusion. Please refer to preceding chest CT evaluation. 3.  Aortic Atherosclerosis (ICD10-I70.0). Electronically Signed   By: Randa Ngo M.D.   On: 03/25/2021 15:29   VAS [place] LOWER EXTREMITY VENOUS (DVT)  Result Date: 03/25/2021  Lower Venous DVT Study Patient Name:  MARGRETT KALB  Date of Exam:   03/25/2021 Medical Rec #: [id number]       Accession #:    [id number] Date of Birth: January 12, 1951       Patient Gender: F Patient Age:   20 years Exam Location:  [facility] Procedure:      VAS [place] LOWER EXTREMITY VENOUS (DVT) Referring Phys: PRANAV PATEL --------------------------------------------------------------------------------  Indications: Edema.  Comparison Study: no prior Performing Technologist: Archie Patten RVS  Examination Guidelines: A complete evaluation includes B-mode imaging, spectral Doppler, color Doppler, and power Doppler as needed of all accessible portions of each vessel. Bilateral testing is considered an integral part of a complete examination. Limited examinations for reoccurring indications may be performed as noted. The reflux portion of the exam is performed with the patient in reverse Trendelenburg.  +---------+---------------+---------+-----------+----------+--------------+ RIGHT    CompressibilityPhasicitySpontaneityPropertiesThrombus Aging +---------+---------------+---------+-----------+----------+--------------+ CFV      Full           Yes      Yes                                 +---------+---------------+---------+-----------+----------+--------------+ SFJ      Full                                                        +---------+---------------+---------+-----------+----------+--------------+ FV Prox  Full                                                         +---------+---------------+---------+-----------+----------+--------------+  FV Mid   Full                                                        +---------+---------------+---------+-----------+----------+--------------+ FV DistalFull                                                        +---------+---------------+---------+-----------+----------+--------------+ PFV      Full                                                        +---------+---------------+---------+-----------+----------+--------------+ POP      Full           Yes      Yes                                 +---------+---------------+---------+-----------+----------+--------------+ PTV      Full                                                        +---------+---------------+---------+-----------+----------+--------------+ PERO     Full                                                        +---------+---------------+---------+-----------+----------+--------------+   +---------+---------------+---------+-----------+----------+--------------+ LEFT     CompressibilityPhasicitySpontaneityPropertiesThrombus Aging +---------+---------------+---------+-----------+----------+--------------+ CFV      Full           Yes      Yes                                 +---------+---------------+---------+-----------+----------+--------------+ SFJ      Full                                                        +---------+---------------+---------+-----------+----------+--------------+ FV Prox  Full                                                        +---------+---------------+---------+-----------+----------+--------------+ FV Mid   Full                                                        +---------+---------------+---------+-----------+----------+--------------+  FV DistalFull                                                         +---------+---------------+---------+-----------+----------+--------------+ PFV      Full                                                        +---------+---------------+---------+-----------+----------+--------------+ POP      Full           Yes      Yes                                 +---------+---------------+---------+-----------+----------+--------------+ PTV      Full                                                        +---------+---------------+---------+-----------+----------+--------------+ PERO     Full                                                        +---------+---------------+---------+-----------+----------+--------------+     Summary: BILATERAL: - No evidence of deep vein thrombosis seen in the lower extremities, bilaterally. -No evidence of popliteal cyst, bilaterally.   *See table(s) above for measurements and observations. Electronically signed by Harold Barban MD on 03/25/2021 at 9:25:17 PM.    Final

## 2021-03-27 DIAGNOSIS — R591 Generalized enlarged lymph nodes: Secondary | ICD-10-CM | POA: Diagnosis not present

## 2021-03-27 DIAGNOSIS — D649 Anemia, unspecified: Secondary | ICD-10-CM | POA: Diagnosis not present

## 2021-03-27 LAB — CBC WITH DIFFERENTIAL/PLATELET
Abs Immature Granulocytes: 0.21 10*3/uL — ABNORMAL HIGH (ref 0.00–0.07)
Basophils Absolute: 0.1 10*3/uL (ref 0.0–0.1)
Basophils Relative: 0 %
Eosinophils Absolute: 0.5 10*3/uL (ref 0.0–0.5)
Eosinophils Relative: 3 %
HCT: 26.4 % — ABNORMAL LOW (ref 36.0–46.0)
Hemoglobin: 8.2 g/dL — ABNORMAL LOW (ref 12.0–15.0)
Immature Granulocytes: 2 %
Lymphocytes Relative: 30 %
Lymphs Abs: 4.1 10*3/uL — ABNORMAL HIGH (ref 0.7–4.0)
MCH: 27.2 pg (ref 26.0–34.0)
MCHC: 31.1 g/dL (ref 30.0–36.0)
MCV: 87.7 fL (ref 80.0–100.0)
Monocytes Absolute: 1.7 10*3/uL — ABNORMAL HIGH (ref 0.1–1.0)
Monocytes Relative: 12 %
Neutro Abs: 7.3 10*3/uL (ref 1.7–7.7)
Neutrophils Relative %: 53 %
Platelets: 428 10*3/uL — ABNORMAL HIGH (ref 150–400)
RBC: 3.01 MIL/uL — ABNORMAL LOW (ref 3.87–5.11)
RDW: 13.2 % (ref 11.5–15.5)
WBC: 13.8 10*3/uL — ABNORMAL HIGH (ref 4.0–10.5)
nRBC: 0 % (ref 0.0–0.2)

## 2021-03-27 LAB — BRAIN NATRIURETIC PEPTIDE: B Natriuretic Peptide: 55.5 pg/mL (ref 0.0–100.0)

## 2021-03-27 LAB — COMPREHENSIVE METABOLIC PANEL
ALT: 37 U/L (ref 0–44)
AST: 34 U/L (ref 15–41)
Albumin: 2.6 g/dL — ABNORMAL LOW (ref 3.5–5.0)
Alkaline Phosphatase: 113 U/L (ref 38–126)
Anion gap: 7 (ref 5–15)
BUN: <5 mg/dL — ABNORMAL LOW (ref 8–23)
CO2: 24 mmol/L (ref 22–32)
Calcium: 8.6 mg/dL — ABNORMAL LOW (ref 8.9–10.3)
Chloride: 105 mmol/L (ref 98–111)
Creatinine, Ser: 0.58 mg/dL (ref 0.44–1.00)
GFR, Estimated: >60 mL/min (ref >60)
Glucose, Bld: 105 mg/dL — ABNORMAL HIGH (ref 70–99)
Potassium: 3.7 mmol/L (ref 3.5–5.1)
Sodium: 136 mmol/L (ref 135–145)
Total Bilirubin: 0.3 mg/dL (ref 0.3–1.2)
Total Protein: 6 g/dL — ABNORMAL LOW (ref 6.5–8.1)

## 2021-03-27 LAB — MAGNESIUM: Magnesium: 2 mg/dL (ref 1.7–2.4)

## 2021-03-27 LAB — PATHOLOGIST SMEAR REVIEW

## 2021-03-27 LAB — C-REACTIVE PROTEIN: CRP: 16.2 mg/dL — ABNORMAL HIGH (ref <1.0)

## 2021-03-27 NOTE — Progress Notes (Signed)
PROGRESS NOTE                                                                                                                                                                                                             Patient Demographics:    Katie Mcneil, is a 70 y.o. female, DOB - 12-28-1950, KNL:[id number]  Outpatient Primary MD for the patient is [name], [place]    LOS - 3  Admit date - 03/23/2021    Chief Complaint  Patient presents with   Cough       Brief Narrative (HPI from H&P)  Katie Mcneil is a 70 y.o. female with medical history significant of seizure disorder. Presents with complaints of shortness of breath.  Have low-grade fever as well as cough ongoing for almost 10 days, CT scan showed possible atypical pneumonia with diffuse lymphadenopathy in the chest, she had a similar CT scan few weeks ago and apparently was supposed to see general surgery outpatient and have an outpatient PET scan.  However she was not doing well earlier.   Subjective:   Patient in bed, appears comfortable, denies any headache, no fever, no chest pain or pressure, no shortness of breath , no abdominal pain. No new focal weakness.    Assessment  & Plan :     Fever, night sweats, cough and shortness of breath with diffuse chest lymphadenopathy on CT scan - she could have atypical pneumonia and is on ABX for it.  2.  Diffuse Chest lymphadenopathy, low grade fevers and night sweats.  LDH is mildly elevated but ESR and CRP are significantly elevated, underlying malignancy cannot be ruled out, peripheral smear has been ordered - stable , CT- guided lymph node biopsy done by IR and was done 03/26/21 - results pending, Haem following as well.   3.  History of seizures.  Stable on Lamictal.  4.  Mild chronic normocytic anemia.  Monitor.        Condition - Fair  Family Communication  :  none present  Code Status :   Full  Consults  :  Onco  PUD Prophylaxis :    Procedures  :     CT guided Node biopsy done 03/26/21  CT Abd  - Pelvis - 1. Extensive lymphadenopathy throughout the abdomen and pelvis. Favor lymphoproliferative disorder over metastatic disease.  2. Bibasilar pneumonia and small left pleural effusion. Please refer to preceding chest CT evaluation. 3.  Aortic Atherosclerosis (ICD10-I70.0).  CT Chest - Mediastinal, bilateral hilar and bilateral axillary as well as upper abdominal adenopathy, stable since prior study. Findings are concerning for lymphoma or metastatic disease. Recommend clinical correlation.   New bilateral airspace disease, left greater than right concerning for multifocal pneumonia. Small left pleural effusion. No evidence of pulmonary embolus.  Coronary artery disease.  Aortic Atherosclerosis      Disposition Plan  :    Status is: Inpatient  Remains inpatient appropriate because:IV treatments appropriate due to intensity of illness or inability to take PO  Dispo: The patient is from: Home              Anticipated d/c is to: Home              Patient currently is not medically stable to d/c.   Difficult to place patient No    DVT Prophylaxis  :    enoxaparin (LOVENOX) injection 40 mg Start: 03/24/21 1930    Lab Results  Component Value Date   PLT 428 (H) 03/27/2021    Diet :  Diet Order             Diet Heart Room service appropriate? Yes; Fluid consistency: Thin  Diet effective now                    Inpatient Medications  Scheduled Meds:  azithromycin  500 mg Oral q1800   enoxaparin (LOVENOX) injection  40 mg Subcutaneous Q24H   guaiFENesin  600 mg Oral BID   LamoTRIgine  300 mg Oral Daily   Continuous Infusions:  sodium chloride 250 mL (03/23/21 1733)   cefTRIAXone (ROCEPHIN)  IV 2 g (03/26/21 1616)   PRN Meds:.sodium chloride, acetaminophen **OR** acetaminophen, albuterol, benzonatate, fentaNYL, midazolam, ondansetron **OR**  ondansetron (ZOFRAN) IV, senna-docusate  Antibiotics  :    Anti-infectives (From admission, onward)    Start     Dose/Rate Route Frequency Ordered Stop   03/25/21 1800  azithromycin (ZITHROMAX) tablet 500 mg        500 mg Oral Daily-1800 03/25/21 0837 03/28/21 1759   03/23/21 1645  cefTRIAXone (ROCEPHIN) 2 g in sodium chloride 0.9 % 100 mL IVPB        2 g 200 mL/hr over 30 Minutes Intravenous Every 24 hours 03/23/21 1641 03/28/21 1644   03/23/21 1645  azithromycin (ZITHROMAX) 500 mg in sodium chloride 0.9 % 250 mL IVPB  Status:  Discontinued        500 mg 250 mL/hr over 60 Minutes Intravenous Every 24 hours 03/23/21 1641 03/25/21 0837        Time Spent in minutes  30   Lala Lund M.D on 03/27/2021 at 10:14 AM  To page go to [email or web address]   [facility] -  Office  [phone]  See all Orders from today for further details    Objective:   Vitals:   03/26/21 1934 03/27/21 0315 03/27/21 0316 03/27/21 0723  BP: (!) 104/49 (!) 113/56  115/72  Pulse: 90 85  81  Resp: _0 Temp: 99.3 F (37.4 C) 98.7 F (37.1 C)  99.3 F (37.4 C)  TempSrc: Oral Oral  Oral  SpO2: 98% 95%  95%  Weight:   74.6 kg   Height:        Wt Readings from Last 3 Encounters:  03/27/21 74.6 kg  03/23/21 72.3  kg  03/17/21 73 kg     Intake/Output Summary (Last 24 hours) at 03/27/2021 1014 Last data filed at 03/27/2021 1002 Gross per 24 hour  Intake 120 ml  Output --  Net 120 ml     Physical Exam  Awake Alert, No new F.N deficits, Normal affect [place].AT,PERRAL Supple Neck, No JVD,   Symmetrical Chest wall movement, Good air movement bilaterally, CTAB RRR,No Gallops, Rubs or new Murmurs,  +ve B.Sounds, Abd Soft, No tenderness,   No Cyanosis, Clubbing or edema,       Data Review:    CBC Recent Labs  Lab 03/23/21 1530 03/25/21 0450 03/26/21 0134 03/27/21 0152  WBC 17.4* 16.1* 18.1* 13.8*  HGB 10.4* 9.5* 8.5* 8.2*  HCT 33.0* 29.6* 28.2* 26.4*  PLT 387 411* 399  428*  MCV 88.0 88.1 91.3 87.7  MCH 27.7 28.3 27.5 27.2  MCHC 31.5 32.1 30.1 31.1  RDW 13.4 13.2 13.4 13.2  LYMPHSABS 3.0 3.6 4.2* 4.1*  MONOABS 1.6* 1.5* 2.0* 1.7*  EOSABS 0.4 0.7* 0.5 0.5  BASOSABS 0.1 0.1 0.1 0.1    Recent Labs  Lab 03/23/21 1530 03/23/21 1719 03/23/21 1920 03/24/21 1923 03/25/21 0450 03/26/21 0134 03/27/21 0152  NA 137  --   --   --  137 135 136  K 3.7  --   --   --  3.5 3.6 3.7  CL 102  --   --   --  102 104 105  CO2 23  --   --   --  _0 GLUCOSE 96  --   --   --  89 112* 105*  BUN 9  --   --   --  9 9 <5*  CREATININE 0.64  --   --   --  0.60 0.66 0.58  CALCIUM 9.6  --   --   --  8.8* 8.5* 8.6*  AST 31  --   --   --   --  39 34  ALT 39  --   --   --   --  37 37  ALKPHOS 123  --   --   --   --  126 113  BILITOT 0.5  --   --   --   --  0.7 0.3  ALBUMIN 4.0  --   --   --   --  2.7* 2.6*  MG  --   --   --   --  2.1 2.2 2.0  CRP  --   --   --  20.7* 18.6* 19.8* 16.2*  PROCALCITON  --   --   --  <0.10 <0.10 <0.10  --   LATICACIDVEN  --  0.9 0.5  --   --   --   --   INR 1.1  --   --   --   --   --   --   TSH  --   --   --  1.721  --   --   --   BNP 75.3  --   --  77.5  --  55.5 55.5    ------------------------------------------------------------------------------------------------------------------ Recent Labs    03/25/21 1502  TRIG 107    No results found for: HGBA1C ------------------------------------------------------------------------------------------------------------------ Recent Labs    03/24/21 1923  TSH 1.721    Cardiac Enzymes No results for input(s): CKMB, TROPONINI, MYOGLOBIN in the last 168 hours.  Invalid input(s): CK ------------------------------------------------------------------------------------------------------------------    Component Value Date/Time  BNP 55.5 03/27/2021 0152      Radiology Reports DG Chest 2 View  Result Date: 03/23/2021 CLINICAL DATA:  sob EXAM: CHEST - 2 VIEW COMPARISON:  Chest  radiograph December 20, 2014. chest CT March 14, 2021. FINDINGS: Patchy opacities in the left lung and right upper lobe. No visible pleural effusions or pneumothorax. Cardiomediastinal silhouette is within normal limits. No acute osseous abnormality. Mild reverse S-shaped thoracolumbar curvature. IMPRESSION: Patchy opacities in the left lung and right upper lobe, concerning for multifocal pneumonia. Recommend imaging follow-up to ensure resolution. Electronically Signed   By: Margaretha Sheffield M.D.   On: 03/23/2021 16:04   CT Angio Chest PE W and/or Wo Contrast  Result Date: 03/23/2021 CLINICAL DATA:  Fever, cough, shortness of breath EXAM: CT ANGIOGRAPHY CHEST WITH CONTRAST TECHNIQUE: Multidetector CT imaging of the chest was performed using the standard protocol during bolus administration of intravenous contrast. Multiplanar CT image reconstructions and MIPs were obtained to evaluate the vascular anatomy. CONTRAST:  61m OMNIPAQUE IOHEXOL 350 MG/ML SOLN COMPARISON:  03/14/2021 FINDINGS: Cardiovascular: No filling defects in the pulmonary arteries to suggest pulmonary emboli. Heart is normal size. Aorta is normal caliber. Scattered coronary artery calcifications and aortic calcifications. Mediastinum/Nodes: There is mediastinal, bilateral hilar and bilateral axillary adenopathy again noted, stable since prior study. Trachea and esophagus are unremarkable. Lungs/Pleura: New bilateral ground-glass airspace opacities noted with tree-in-bud nodular densities also seen bilaterally. Findings are more pronounced on the left. Findings likely reflect multifocal pneumonia. Atypical/viral pneumonia possible. Small left pleural effusion. Upper Abdomen: Upper abdominal/retroperitoneal adenopathy again noted, unchanged since prior study. Musculoskeletal: Chest wall soft tissues are unremarkable. No acute bony abnormality. Review of the MIP images confirms the above findings. IMPRESSION: Mediastinal, bilateral hilar and  bilateral axillary as well as upper abdominal adenopathy, stable since prior study. Findings are concerning for lymphoma or metastatic disease. Recommend clinical correlation. New bilateral airspace disease, left greater than right concerning for multifocal pneumonia. Small left pleural effusion. No evidence of pulmonary embolus. Coronary artery disease. Aortic Atherosclerosis (ICD10-I70.0). Electronically Signed   By: KRolm BaptiseM.D.   On: 03/23/2021 18:37   CT Angio Chest PE W and/or Wo Contrast  Result Date: 03/14/2021 CLINICAL DATA:  High probability for PE. Cough and shortness of breath. EXAM: CT ANGIOGRAPHY CHEST WITH CONTRAST TECHNIQUE: Multidetector CT imaging of the chest was performed using the standard protocol during bolus administration of intravenous contrast. Multiplanar CT image reconstructions and MIPs were obtained to evaluate the vascular anatomy. CONTRAST:  860mOMNIPAQUE IOHEXOL 350 MG/ML SOLN COMPARISON:  None. FINDINGS: Cardiovascular: Satisfactory opacification of the pulmonary arteries to the segmental level. No evidence of pulmonary embolism. Normal heart size. No pericardial effusion. Mediastinum/Nodes: There is an indeterminate 11 mm right thyroid nodule. There is an enlarged subcarinal lymph node measuring 13 mm short axis. There are enlarged bilateral hilar lymph nodes measuring up to 15 mm short axis. There also enlarged bilateral axillary lymph nodes. The largest lymph node on the left measures 1.8 by 3.7 cm in the largest lymph node on the right measures 1.3 by 2.0 cm. The esophagus is nondilated. Lungs/Pleura: Lungs are clear. No pleural effusion or pneumothorax. Upper Abdomen: There is likely adenopathy in the periaortic region image 4/136 measuring 3.7 x 1.4 cm. Musculoskeletal: No chest wall abnormality. No acute or significant osseous findings. Review of the MIP images confirms the above findings. IMPRESSION: 1. No evidence for pulmonary embolism. 2. Mediastinal, hilar,  bilateral axillary and upper abdominal lymphadenopathy of uncertain etiology. Findings may  be related to lymphoma or metastatic disease. Recommend clinical correlation and follow-up. 3. 1.1 cm incidental right thyroid nodule. No follow-up imaging is recommended. Reference: J Am Coll Radiol. 2015 Feb;12(2): 143-50 Electronically Signed   By: Ronney Asters M.D.   On: 03/14/2021 23:46   CT ABDOMEN PELVIS W CONTRAST  Result Date: 03/25/2021 CLINICAL DATA:  Productive cough, increased shortness of breath for several weeks, lymphadenopathy EXAM: CT ABDOMEN AND PELVIS WITH CONTRAST TECHNIQUE: Multidetector CT imaging of the abdomen and pelvis was performed using the standard protocol following bolus administration of intravenous contrast. CONTRAST:  153m OMNIPAQUE IOHEXOL 300 MG/ML  SOLN COMPARISON:  03/14/2021, 03/23/2021 FINDINGS: Lower chest: Bibasilar airspace disease and small left pleural effusion consistent with multifocal pneumonia. The heart is unremarkable. Hepatobiliary: No focal liver abnormality is seen. No gallstones, gallbladder wall thickening, or biliary dilatation. Pancreas: Unremarkable. No pancreatic ductal dilatation or surrounding inflammatory changes. Spleen: Normal in size without focal abnormality. Adrenals/Urinary Tract: Adrenal glands are unremarkable. Kidneys are normal, without renal calculi, focal lesion, or hydronephrosis. Bladder is unremarkable. Stomach/Bowel: No bowel obstruction or ileus. Normal appendix right lower quadrant. No bowel wall thickening or inflammatory change. Vascular/Lymphatic: Extensive mesenteric and retroperitoneal lymphadenopathy is identified. Index lymph nodes are as follows: Left para-aortic, image 34/3, 15 x 25 mm. Left external iliac, image 67/3, 21 x 29 mm. Upper abdominal mesentery, image 36/3, 15 x 27 mm. Mild atherosclerosis.  No other significant vascular findings. Reproductive: Calcified uterine fibroid left aspect uterine fundus measuring 1.7 cm. No  adnexal masses. Other: No free fluid or free gas.  No abdominal wall hernia. Musculoskeletal: No acute or destructive bony lesions. Reconstructed images demonstrate no additional findings. IMPRESSION: 1. Extensive lymphadenopathy throughout the abdomen and pelvis. Favor lymphoproliferative disorder over metastatic disease. 2. Bibasilar pneumonia and small left pleural effusion. Please refer to preceding chest CT evaluation. 3.  Aortic Atherosclerosis (ICD10-I70.0). Electronically Signed   By: MRanda NgoM.D.   On: 03/25/2021 15:29   [place] BIOPSY (LYMPH NODES)  Result Date: 03/26/2021 INDICATION: Extensive lymphadenopathy in the abdomen and pelvis. No known primary. EXAM: ULTRASOUND GUIDED CORE BIOPSY OF RIGHT INGUINAL ADENOPATHY MEDICATIONS: Lidocaine 1% subcutaneous ANESTHESIA/SEDATION: Intravenous Fentanyl 247m and Versed 62m33mere administered as conscious sedation during continuous monitoring of the patient's level of consciousness and physiological / cardiorespiratory status by the radiology RN, with a total moderate sedation time of 14 minutes. PROCEDURE: The procedure, risks, benefits, and alternatives were explained to the patient. Questions regarding the procedure were encouraged and answered. The patient understands and consents to the procedure. Survey ultrasound of the right inguinal region performed. Representative adenopathy was localized and an appropriate skin entry site was determined and marked. The operative field was prepped with chlorhexidine in a sterile fashion, and a sterile drape was applied covering the operative field. A sterile gown and sterile gloves were used for the procedure. Local anesthesia was provided with 1% Lidocaine. Under real-time ultrasound guidance, a 17 gauge trocar needle was advanced to the margin of the lesion. Once needle tip position was confirmed, coaxial 18-gauge core biopsy samples were obtained, submitted in saline to surgical pathology. The guide  needle was removed. Postprocedure scans show no hemorrhage or other apparent complication. COMPLICATIONS: None immediate. FINDINGS: Multiple enlarged right inguinal lymph nodes were identified corresponding to CT findings. Representative core biopsy samples obtained as above IMPRESSION: 1. Technically successful ultrasound-guided core biopsy, right inguinal adenopathy. Electronically Signed   By: D  Lucrezia EuropeD.   On: 03/26/2021 16:23   VAS  [place] LOWER EXTREMITY VENOUS (DVT)  Result Date: 03/25/2021  Lower Venous DVT Study Patient Name:  TAHIRY SPICER  Date of Exam:   03/25/2021 Medical Rec #: [id number]       Accession #:    [id number] Date of Birth: Feb 20, 1951       Patient Gender: F Patient Age:   53 years Exam Location:  [facility] Procedure:      VAS [place] LOWER EXTREMITY VENOUS (DVT) Referring Phys: PRANAV PATEL --------------------------------------------------------------------------------  Indications: Edema.  Comparison Study: no prior Performing Technologist: Archie Patten RVS  Examination Guidelines: A complete evaluation includes B-mode imaging, spectral Doppler, color Doppler, and power Doppler as needed of all accessible portions of each vessel. Bilateral testing is considered an integral part of a complete examination. Limited examinations for reoccurring indications may be performed as noted. The reflux portion of the exam is performed with the patient in reverse Trendelenburg.  +---------+---------------+---------+-----------+----------+--------------+ RIGHT    CompressibilityPhasicitySpontaneityPropertiesThrombus Aging +---------+---------------+---------+-----------+----------+--------------+ CFV      Full           Yes      Yes                                 +---------+---------------+---------+-----------+----------+--------------+ SFJ      Full                                                         +---------+---------------+---------+-----------+----------+--------------+ FV Prox  Full                                                        +---------+---------------+---------+-----------+----------+--------------+ FV Mid   Full                                                        +---------+---------------+---------+-----------+----------+--------------+ FV DistalFull                                                        +---------+---------------+---------+-----------+----------+--------------+ PFV      Full                                                        +---------+---------------+---------+-----------+----------+--------------+ POP      Full           Yes      Yes                                 +---------+---------------+---------+-----------+----------+--------------+ PTV      Full                                                        +---------+---------------+---------+-----------+----------+--------------+  PERO     Full                                                        +---------+---------------+---------+-----------+----------+--------------+   +---------+---------------+---------+-----------+----------+--------------+ LEFT     CompressibilityPhasicitySpontaneityPropertiesThrombus Aging +---------+---------------+---------+-----------+----------+--------------+ CFV      Full           Yes      Yes                                 +---------+---------------+---------+-----------+----------+--------------+ SFJ      Full                                                        +---------+---------------+---------+-----------+----------+--------------+ FV Prox  Full                                                        +---------+---------------+---------+-----------+----------+--------------+ FV Mid   Full                                                         +---------+---------------+---------+-----------+----------+--------------+ FV DistalFull                                                        +---------+---------------+---------+-----------+----------+--------------+ PFV      Full                                                        +---------+---------------+---------+-----------+----------+--------------+ POP      Full           Yes      Yes                                 +---------+---------------+---------+-----------+----------+--------------+ PTV      Full                                                        +---------+---------------+---------+-----------+----------+--------------+ PERO     Full                                                        +---------+---------------+---------+-----------+----------+--------------+  Summary: BILATERAL: - No evidence of deep vein thrombosis seen in the lower extremities, bilaterally. -No evidence of popliteal cyst, bilaterally.   *See table(s) above for measurements and observations. Electronically signed by Harold Barban MD on 03/25/2021 at 9:25:17 PM.    Final

## 2021-03-27 NOTE — Progress Notes (Signed)
[name]  HEMATOLOGY/ONCOLOGY INPATIENT PROGRESS NOTE  Date of Service: 03/27/2021  Inpatient Attending: .Thurnell Lose, MD   SUBJECTIVE  Patient was seen in hematology/oncology follow-up in the hospital.  Patient's daughter was at bedside. Patient looks much brighter and notes that she is feeling much better.  Cough is nearly resolved.  Shortness of breath much improved.  Fevers resolving. No other acute new symptoms. Discussed very initial preliminary pathology findings with Dr. Gari Crown from pathology.  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  Final pathology pending Will need flow cytometry and other immunohistochemistry to confirm this.  This preliminary results were discussed with the patient. She is agreeable with the plan to follow-up with [place].  We shall set her up for follow-up with me on 04/02/2021 at 12 noon.    OBJECTIVE:  NAD  PHYSICAL EXAMINATION: . Vitals:   03/27/21 0315 03/27/21 0316 03/27/21 0723 03/27/21 1200  BP: (!) 113/56  115/72 (!) 105/54  Pulse: 85  81 81  Resp: _0 Temp: 98.7 F (37.1 C)  99.3 F (37.4 C) 97.9 F (36.6 C)  TempSrc: Oral  Oral Oral  SpO2: 95%  95% 97%  Weight:  164 lb 7.4 oz (74.6 kg)    Height:       Filed Weights   03/25/21 0500 03/26/21 0500 03/27/21 0316  Weight: 161 lb 6 oz (73.2 kg) 165 lb 5.5 oz (75 kg) 164 lb 7.4 oz (74.6 kg)   .Body mass index is 26.55 kg/m. LUNGS: Few scattered rales bilaterally with good air movement HEART: regular rate & rhythm, tachycardia ABDOMEN: abdomen soft, non-tender, normoactive bowel sounds  Musculoskeletal: no pedal edema PSYCH: alert & oriented x 3 with fluent speech NEURO: no focal motor/sensory deficits  MEDICAL HISTORY:  Past Medical History:  Diagnosis Date   Dyslipidemia    Seizures ([place])     SURGICAL HISTORY: Past Surgical History:  Procedure Laterality Date   CATARACT EXTRACTION W/ INTRAOCULAR LENS IMPLANT Bilateral  2019   CESAREAN SECTION  1981, 1990   NO PAST SURGERIES     TUBAL LIGATION      SOCIAL HISTORY: Social History   Socioeconomic History   Marital status: Widowed    Spouse name: Not on file   Number of children: 2   Years of education: Not on file   Highest education level: Not on file  Occupational History   Not on file  Tobacco Use   Smoking status: Never   Smokeless tobacco: Never  Vaping Use   Vaping Use: Never used  Substance and Sexual Activity   Alcohol use: Not Currently    Alcohol/week: 0.0 standard drinks   Drug use: No   Sexual activity: Not Currently  Other Topics Concern   Not on file  Social History Narrative   Works in Engineer, 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[employer], contracting   Lives with daughter (who has mental health issues)   Caregiver for mother   Has two children   Widowed   Social Determinants of Radio broadcast assistant Strain: Not on file  Food Insecurity: Not on file  Transportation Needs: Not on file  Physical Activity: Not on file  Stress: Not on file  Social Connections: Not on file  Intimate Partner Violence: Not on file    FAMILY HISTORY: Family History  Problem Relation Age of Onset   Neuropathy Mother    Anuerysm Father    Leukemia Sister 96   Alzheimer's disease Sister 39  Dementia Brother    Heart attack Maternal Grandmother    CVA Maternal Grandfather    Dementia Paternal Grandmother    Seizures Daughter    Other Daughter        Psychological disorder   Colon cancer Neg Hx     ALLERGIES:  has No Known Allergies.  MEDICATIONS:  Scheduled Meds:  azithromycin  500 mg Oral q1800   enoxaparin (LOVENOX) injection  40 mg Subcutaneous Q24H   guaiFENesin  600 mg Oral BID   LamoTRIgine  300 mg Oral Daily   Continuous Infusions:  sodium chloride 250 mL (03/23/21 1733)   cefTRIAXone (ROCEPHIN)  IV 2 g (03/26/21 1616)   PRN Meds:.sodium chloride, acetaminophen **OR** acetaminophen, albuterol, benzonatate, fentaNYL, midazolam, ondansetron **OR**  ondansetron (ZOFRAN) IV, senna-docusate  REVIEW OF SYSTEMS:    10 Point review of Systems was done is negative except as noted above.   LABORATORY DATA:  I have reviewed the data as listed  . CBC Latest Ref Rng & Units 03/27/2021 03/26/2021 03/25/2021  WBC 4.0 - 10.5 K/uL 13.8(H) 18.1(H) 16.1(H)  Hemoglobin 12.0 - 15.0 g/dL 8.2(L) 8.5(L) 9.5(L)  Hematocrit 36.0 - 46.0 % 26.4(L) 28.2(L) 29.6(L)  Platelets 150 - 400 K/uL 428(H) 399 411(H)   .CBC    Component Value Date/Time   WBC 13.8 (H) 03/27/2021 0152   RBC 3.01 (L) 03/27/2021 0152   HGB 8.2 (L) 03/27/2021 0152   HGB 11.3 12/14/2018 1017   HCT 26.4 (L) 03/27/2021 0152   HCT 35.7 12/14/2018 1017   PLT 428 (H) 03/27/2021 0152   PLT 289 12/14/2018 1017   MCV 87.7 03/27/2021 0152   MCV 87 12/14/2018 1017   MCH 27.2 03/27/2021 0152   MCHC 31.1 03/27/2021 0152   RDW 13.2 03/27/2021 0152   RDW 13.6 12/14/2018 1017   LYMPHSABS 4.1 (H) 03/27/2021 0152   LYMPHSABS 1.3 12/14/2018 1017   MONOABS 1.7 (H) 03/27/2021 0152   EOSABS 0.5 03/27/2021 0152   EOSABS 0.3 12/14/2018 1017   BASOSABS 0.1 03/27/2021 0152   BASOSABS 0.0 12/14/2018 1017      . CMP Latest Ref Rng & Units 03/27/2021 03/26/2021 03/25/2021  Glucose 70 - 99 mg/dL 105(H) 112(H) 89  BUN 8 - 23 mg/dL <5(L) 9 9  Creatinine 0.44 - 1.00 mg/dL 0.58 0.66 0.60  Sodium 135 - 145 mmol/L 136 135 137  Potassium 3.5 - 5.1 mmol/L 3.7 3.6 3.5  Chloride 98 - 111 mmol/L 105 104 102  CO2 22 - 32 mmol/L _0 Calcium 8.9 - 10.3 mg/dL 8.6(L) 8.5(L) 8.8(L)  Total Protein 6.5 - 8.1 g/dL 6.0(L) 6.2(L) -  Total Bilirubin 0.3 - 1.2 mg/dL 0.3 0.7 -  Alkaline Phos 38 - 126 U/L 113 126 -  AST 15 - 41 U/L 34 39 -  ALT 0 - 44 U/L 37 37 -     RADIOGRAPHIC STUDIES: I have personally reviewed the radiological images as listed and agreed with the findings in the report. DG Chest 2 View  Result Date: 03/23/2021 CLINICAL DATA:  sob EXAM: CHEST - 2 VIEW COMPARISON:  Chest  radiograph December 20, 2014. chest CT March 14, 2021. FINDINGS: Patchy opacities in the left lung and right upper lobe. No visible pleural effusions or pneumothorax. Cardiomediastinal silhouette is within normal limits. No acute osseous abnormality. Mild reverse S-shaped thoracolumbar curvature. IMPRESSION: Patchy opacities in the left lung and right upper lobe, concerning for multifocal pneumonia. Recommend imaging follow-up to ensure resolution. Electronically Signed  By: Margaretha Sheffield M.D.   On: 03/23/2021 16:04   CT Angio Chest PE W and/or Wo Contrast  Result Date: 03/23/2021 CLINICAL DATA:  Fever, cough, shortness of breath EXAM: CT ANGIOGRAPHY CHEST WITH CONTRAST TECHNIQUE: Multidetector CT imaging of the chest was performed using the standard protocol during bolus administration of intravenous contrast. Multiplanar CT image reconstructions and MIPs were obtained to evaluate the vascular anatomy. CONTRAST:  37m OMNIPAQUE IOHEXOL 350 MG/ML SOLN COMPARISON:  03/14/2021 FINDINGS: Cardiovascular: No filling defects in the pulmonary arteries to suggest pulmonary emboli. Heart is normal size. Aorta is normal caliber. Scattered coronary artery calcifications and aortic calcifications. Mediastinum/Nodes: There is mediastinal, bilateral hilar and bilateral axillary adenopathy again noted, stable since prior study. Trachea and esophagus are unremarkable. Lungs/Pleura: New bilateral ground-glass airspace opacities noted with tree-in-bud nodular densities also seen bilaterally. Findings are more pronounced on the left. Findings likely reflect multifocal pneumonia. Atypical/viral pneumonia possible. Small left pleural effusion. Upper Abdomen: Upper abdominal/retroperitoneal adenopathy again noted, unchanged since prior study. Musculoskeletal: Chest wall soft tissues are unremarkable. No acute bony abnormality. Review of the MIP images confirms the above findings. IMPRESSION: Mediastinal, bilateral hilar and  bilateral axillary as well as upper abdominal adenopathy, stable since prior study. Findings are concerning for lymphoma or metastatic disease. Recommend clinical correlation. New bilateral airspace disease, left greater than right concerning for multifocal pneumonia. Small left pleural effusion. No evidence of pulmonary embolus. Coronary artery disease. Aortic Atherosclerosis (ICD10-I70.0). Electronically Signed   By: KRolm BaptiseM.D.   On: 03/23/2021 18:37   CT Angio Chest PE W and/or Wo Contrast  Result Date: 03/14/2021 CLINICAL DATA:  High probability for PE. Cough and shortness of breath. EXAM: CT ANGIOGRAPHY CHEST WITH CONTRAST TECHNIQUE: Multidetector CT imaging of the chest was performed using the standard protocol during bolus administration of intravenous contrast. Multiplanar CT image reconstructions and MIPs were obtained to evaluate the vascular anatomy. CONTRAST:  873mOMNIPAQUE IOHEXOL 350 MG/ML SOLN COMPARISON:  None. FINDINGS: Cardiovascular: Satisfactory opacification of the pulmonary arteries to the segmental level. No evidence of pulmonary embolism. Normal heart size. No pericardial effusion. Mediastinum/Nodes: There is an indeterminate 11 mm right thyroid nodule. There is an enlarged subcarinal lymph node measuring 13 mm short axis. There are enlarged bilateral hilar lymph nodes measuring up to 15 mm short axis. There also enlarged bilateral axillary lymph nodes. The largest lymph node on the left measures 1.8 by 3.7 cm in the largest lymph node on the right measures 1.3 by 2.0 cm. The esophagus is nondilated. Lungs/Pleura: Lungs are clear. No pleural effusion or pneumothorax. Upper Abdomen: There is likely adenopathy in the periaortic region image 4/136 measuring 3.7 x 1.4 cm. Musculoskeletal: No chest wall abnormality. No acute or significant osseous findings. Review of the MIP images confirms the above findings. IMPRESSION: 1. No evidence for pulmonary embolism. 2. Mediastinal, hilar,  bilateral axillary and upper abdominal lymphadenopathy of uncertain etiology. Findings may be related to lymphoma or metastatic disease. Recommend clinical correlation and follow-up. 3. 1.1 cm incidental right thyroid nodule. No follow-up imaging is recommended. Reference: J Am Coll Radiol. 2015 Feb;12(2): 143-50 Electronically Signed   By: AmRonney Asters.D.   On: 03/14/2021 23:46   CT ABDOMEN PELVIS W CONTRAST  Result Date: 03/25/2021 CLINICAL DATA:  Productive cough, increased shortness of breath for several weeks, lymphadenopathy EXAM: CT ABDOMEN AND PELVIS WITH CONTRAST TECHNIQUE: Multidetector CT imaging of the abdomen and pelvis was performed using the standard protocol following bolus administration of intravenous contrast.  CONTRAST:  1756m OMNIPAQUE IOHEXOL 300 MG/ML  SOLN COMPARISON:  03/14/2021, 03/23/2021 FINDINGS: Lower chest: Bibasilar airspace disease and small left pleural effusion consistent with multifocal pneumonia. The heart is unremarkable. Hepatobiliary: No focal liver abnormality is seen. No gallstones, gallbladder wall thickening, or biliary dilatation. Pancreas: Unremarkable. No pancreatic ductal dilatation or surrounding inflammatory changes. Spleen: Normal in size without focal abnormality. Adrenals/Urinary Tract: Adrenal glands are unremarkable. Kidneys are normal, without renal calculi, focal lesion, or hydronephrosis. Bladder is unremarkable. Stomach/Bowel: No bowel obstruction or ileus. Normal appendix right lower quadrant. No bowel wall thickening or inflammatory change. Vascular/Lymphatic: Extensive mesenteric and retroperitoneal lymphadenopathy is identified. Index lymph nodes are as follows: Left para-aortic, image 34/3, 15 x 25 mm. Left external iliac, image 67/3, 21 x 29 mm. Upper abdominal mesentery, image 36/3, 15 x 27 mm. Mild atherosclerosis.  No other significant vascular findings. Reproductive: Calcified uterine fibroid left aspect uterine fundus measuring 1.7 cm. No  adnexal masses. Other: No free fluid or free gas.  No abdominal wall hernia. Musculoskeletal: No acute or destructive bony lesions. Reconstructed images demonstrate no additional findings. IMPRESSION: 1. Extensive lymphadenopathy throughout the abdomen and pelvis. Favor lymphoproliferative disorder over metastatic disease. 2. Bibasilar pneumonia and small left pleural effusion. Please refer to preceding chest CT evaluation. 3.  Aortic Atherosclerosis (ICD10-I70.0). Electronically Signed   By: MRanda NgoM.D.   On: 03/25/2021 15:29   [place] BIOPSY (LYMPH NODES)  Result Date: 03/26/2021 INDICATION: Extensive lymphadenopathy in the abdomen and pelvis. No known primary. EXAM: ULTRASOUND GUIDED CORE BIOPSY OF RIGHT INGUINAL ADENOPATHY MEDICATIONS: Lidocaine 1% subcutaneous ANESTHESIA/SEDATION: Intravenous Fentanyl 227m and Versed 56m456mere administered as conscious sedation during continuous monitoring of the patient's level of consciousness and physiological / cardiorespiratory status by the radiology RN, with a total moderate sedation time of 14 minutes. PROCEDURE: The procedure, risks, benefits, and alternatives were explained to the patient. Questions regarding the procedure were encouraged and answered. The patient understands and consents to the procedure. Survey ultrasound of the right inguinal region performed. Representative adenopathy was localized and an appropriate skin entry site was determined and marked. The operative field was prepped with chlorhexidine in a sterile fashion, and a sterile drape was applied covering the operative field. A sterile gown and sterile gloves were used for the procedure. Local anesthesia was provided with 1% Lidocaine. Under real-time ultrasound guidance, a 17 gauge trocar needle was advanced to the margin of the lesion. Once needle tip position was confirmed, coaxial 18-gauge core biopsy samples were obtained, submitted in saline to surgical pathology. The guide  needle was removed. Postprocedure scans show no hemorrhage or other apparent complication. COMPLICATIONS: None immediate. FINDINGS: Multiple enlarged right inguinal lymph nodes were identified corresponding to CT findings. Representative core biopsy samples obtained as above IMPRESSION: 1. Technically successful ultrasound-guided core biopsy, right inguinal adenopathy. Electronically Signed   By: D  Lucrezia EuropeD.   On: 03/26/2021 16:23   VAS US [place] EXTREMITY VENOUS (DVT)  Result Date: 03/25/2021  Lower Venous DVT Study Patient Name:  SHIGEORGEANA OERTELate of Exam:   03/25/2021 Medical Rec #: [id number]    Accession #:    [id number] of Birth: [birth year]    Patient Gender: F Patient Age:   [age]ars [facility]      VAS [facility] [place] EXTREMITY VENOUS (DVT) Referring Phys: PRANAV PATEL --------------------------------------------------------------------------------  Indications: Edema.  Comparison Study: no prior Performing Technologist: MegArchie PattenS  Examination Guidelines: A complete evaluation includes B-mode imaging, spectral Doppler, color Doppler, and power Doppler as needed of all accessible portions of each vessel. Bilateral testing is considered an integral part of a complete examination. Limited examinations for reoccurring indications may be performed as noted. The reflux portion of the exam is performed with the patient in reverse Trendelenburg.  +---------+---------------+---------+-----------+----------+--------------+ RIGHT    CompressibilityPhasicitySpontaneityPropertiesThrombus Aging +---------+---------------+---------+-----------+----------+--------------+ CFV      Full           Yes      Yes                                 +---------+---------------+---------+-----------+----------+--------------+ SFJ      Full                                                         +---------+---------------+---------+-----------+----------+--------------+ FV Prox  Full                                                        +---------+---------------+---------+-----------+----------+--------------+ FV Mid   Full                                                        +---------+---------------+---------+-----------+----------+--------------+ FV DistalFull                                                        +---------+---------------+---------+-----------+----------+--------------+ PFV      Full                                                        +---------+---------------+---------+-----------+----------+--------------+ POP      Full           Yes      Yes                                 +---------+---------------+---------+-----------+----------+--------------+ PTV      Full                                                        +---------+---------------+---------+-----------+----------+--------------+ PERO     Full                                                        +---------+---------------+---------+-----------+----------+--------------+   +---------+---------------+---------+-----------+----------+--------------+  LEFT     CompressibilityPhasicitySpontaneityPropertiesThrombus Aging +---------+---------------+---------+-----------+----------+--------------+ CFV      Full           Yes      Yes                                 +---------+---------------+---------+-----------+----------+--------------+ SFJ      Full                                                        +---------+---------------+---------+-----------+----------+--------------+ FV Prox  Full                                                        +---------+---------------+---------+-----------+----------+--------------+ FV Mid   Full                                                         +---------+---------------+---------+-----------+----------+--------------+ FV DistalFull                                                        +---------+---------------+---------+-----------+----------+--------------+ PFV      Full                                                        +---------+---------------+---------+-----------+----------+--------------+ POP      Full           Yes      Yes                                 +---------+---------------+---------+-----------+----------+--------------+ PTV      Full                                                        +---------+---------------+---------+-----------+----------+--------------+ PERO     Full                                                        +---------+---------------+---------+-----------+----------+--------------+     Summary: BILATERAL: - No evidence of deep vein thrombosis seen in the lower extremities, bilaterally. -No evidence of popliteal cyst, bilaterally.   *See table(s) above for measurements and observations. Electronically signed by Harold Barban MD on 03/25/2021 at 9:25:17 PM.  Final     ASSESSMENT & PLAN:   70 year old female with  history of seizure disorder with   1) Generalized lymphadenopathy CTA chest showed Mediastinal, hilar, bilateral axillary and upper abdominal lymphadenopathy. This is in the context of an acute respiratory illness. Borderline LDH elevation could be from lung inflammation or lymphoproliferative process.   2) bilateral lung infiltrates concerning for multifocal pneumonia.   3) thrombocytosis likely reactive from infection  4) leukocytosis likely due to infection  5) anemia of infection plus blood draws plus cannot rule out involvement by possible low-grade lymphoma. PLAN -Patient is clinically much improved with near resolution of her respiratory symptoms, much improved appetite and p.o. intake -I discussed pulmonary pathology results with Dr.  Monica Martinez from pathology -port appears to be back with small monomorphic lymphocytes suggestive of low-grade non-Hodgkin's lymphoma. Final pathology results will be likely available next week after flow cytometry and additional immunohistochemical stains. -I discussed these initial results with the patient and her daughter at bedside. -Scheduling message to schedule the patient for hematology oncology follow-up with me on 04/02/2021 at 12 noon at the [facility] [place] with labs. -We discussed that in the presence of significant anemia unless this is improving depending on the final pathology from the lymph node if this is a low-grade lymphoma she might need an outpatient bone marrow biopsy. -Appreciate excellent hospital medicine cares. -Okay to discharge from hematology oncology standpoint whenever hospital medicine feels patient is ready to go home.   I spent 25 minutes counseling the patient face to face. The total time spent in the appointment was 35 minutes and more than 50% was on counseling and direct patient cares.    Sullivan Lone MD [place] AAHIVMS [facility] Hematology/Oncology Physician [facility]  (Office):       [phone] (Work cell):  [phone] (Fax):           [phone]  03/27/2021 4:00 PM

## 2021-03-28 DIAGNOSIS — R509 Fever, unspecified: Secondary | ICD-10-CM

## 2021-03-28 DIAGNOSIS — D72110 Idiopathic hypereosinophilic syndrome (ihes): Secondary | ICD-10-CM

## 2021-03-28 LAB — CULTURE, BLOOD (ROUTINE X 2)
Culture: NO GROWTH
Culture: NO GROWTH
Special Requests: ADEQUATE
Special Requests: ADEQUATE

## 2021-03-28 LAB — CBC WITH DIFFERENTIAL/PLATELET
Abs Immature Granulocytes: 0.19 10*3/uL — ABNORMAL HIGH (ref 0.00–0.07)
Basophils Absolute: 0.1 10*3/uL (ref 0.0–0.1)
Basophils Relative: 1 %
Eosinophils Absolute: 0.6 10*3/uL — ABNORMAL HIGH (ref 0.0–0.5)
Eosinophils Relative: 5 %
HCT: 26.8 % — ABNORMAL LOW (ref 36.0–46.0)
Hemoglobin: 8.4 g/dL — ABNORMAL LOW (ref 12.0–15.0)
Immature Granulocytes: 2 %
Lymphocytes Relative: 37 %
Lymphs Abs: 4.3 10*3/uL — ABNORMAL HIGH (ref 0.7–4.0)
MCH: 27.7 pg (ref 26.0–34.0)
MCHC: 31.3 g/dL (ref 30.0–36.0)
MCV: 88.4 fL (ref 80.0–100.0)
Monocytes Absolute: 1.7 10*3/uL — ABNORMAL HIGH (ref 0.1–1.0)
Monocytes Relative: 14 %
Neutro Abs: 5 10*3/uL (ref 1.7–7.7)
Neutrophils Relative %: 41 %
Platelets: 453 10*3/uL — ABNORMAL HIGH (ref 150–400)
RBC: 3.03 MIL/uL — ABNORMAL LOW (ref 3.87–5.11)
RDW: 13.2 % (ref 11.5–15.5)
WBC: 11.8 10*3/uL — ABNORMAL HIGH (ref 4.0–10.5)
nRBC: 0 % (ref 0.0–0.2)

## 2021-03-28 LAB — COMPREHENSIVE METABOLIC PANEL
ALT: 42 U/L (ref 0–44)
AST: 51 U/L — ABNORMAL HIGH (ref 15–41)
Albumin: 2.5 g/dL — ABNORMAL LOW (ref 3.5–5.0)
Alkaline Phosphatase: 110 U/L (ref 38–126)
Anion gap: 7 (ref 5–15)
BUN: 7 mg/dL — ABNORMAL LOW (ref 8–23)
CO2: 25 mmol/L (ref 22–32)
Calcium: 8.5 mg/dL — ABNORMAL LOW (ref 8.9–10.3)
Chloride: 105 mmol/L (ref 98–111)
Creatinine, Ser: 0.55 mg/dL (ref 0.44–1.00)
GFR, Estimated: >60 mL/min (ref >60)
Glucose, Bld: 107 mg/dL — ABNORMAL HIGH (ref 70–99)
Potassium: 3.7 mmol/L (ref 3.5–5.1)
Sodium: 137 mmol/L (ref 135–145)
Total Bilirubin: 0.2 mg/dL — ABNORMAL LOW (ref 0.3–1.2)
Total Protein: 5.9 g/dL — ABNORMAL LOW (ref 6.5–8.1)

## 2021-03-28 LAB — BRAIN NATRIURETIC PEPTIDE: B Natriuretic Peptide: 50 pg/mL (ref 0.0–100.0)

## 2021-03-28 LAB — MAGNESIUM: Magnesium: 2.1 mg/dL (ref 1.7–2.4)

## 2021-03-28 LAB — C-REACTIVE PROTEIN: CRP: 15.5 mg/dL — ABNORMAL HIGH (ref <1.0)

## 2021-03-28 MED ORDER — AZITHROMYCIN 500 MG PO TABS: 500.0000 mg | ORAL_TABLET | Freq: Every day | ORAL | 0 refills | Status: DC

## 2021-03-28 MED ORDER — ACETAMINOPHEN 500 MG PO TABS: 500.0000 mg | ORAL_TABLET | Freq: Three times a day (TID) | ORAL | 0 refills | Status: AC | PRN

## 2021-03-28 NOTE — Progress Notes (Signed)
Discharge education and packet provided to patient at bedside, all questions and concerns addressed, pt awaiting daughter to arrive and transport her home, no concerns noted.

## 2021-03-28 NOTE — Discharge Summary (Signed)
Katie Mcneil QPR:[id number] DOB: Mar 22, 1951 DOA: 03/23/2021  PCP: [name], PA  Admit date: 03/23/2021  Discharge date: 03/28/2021  Admitted From: Home   Disposition:  Home   Recommendations for Outpatient Follow-up:   Follow up with PCP in 1-2 weeks  PCP Please obtain BMP/CBC, 2 view CXR in 1week,  (see Discharge instructions)   PCP Please follow up on the following pending results: Follow final Lymph node biopsy results   Home Health: None   Equipment/Devices: None  Consultations: IR, Onc Discharge Condition: Stable    CODE STATUS: Full    Diet Recommendation: Heart Healthy   Diet Order             Diet - low sodium heart healthy           Diet Heart Room service appropriate? Yes; Fluid consistency: Thin  Diet effective now                    Chief Complaint  Patient presents with   Cough     Brief history of present illness from the day of admission and additional interim summary    Katie Mcneil is a 70 y.o. female with medical history significant of seizure disorder. Presents with complaints of shortness of breath.  Have low-grade fever as well as cough ongoing for almost 10 days, CT scan showed possible atypical pneumonia with diffuse lymphadenopathy in the chest, she had a similar CT scan few weeks ago and apparently was supposed to see general surgery outpatient and have an outpatient PET scan.  However she was not doing well earlier.                                                                 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  Note  all her cultures are negative.   2.  Diffuse Chest lymphadenopathy, low grade fevers and night sweats.  LDH is mildly elevated but ESR and CRP are significantly elevated, she was seen by hematology along with IR and underwent inguinal node biopsy prelim results consistent with lymphoma, she will follow-up with hematology in the outpatient setting postdischarge, currently will be discharged on supportive care and 3 more days of oral antibiotics for #1 above, all cultures negative.   3.  History of seizures.  Stable on Lamictal.   4.  Mild chronic normocytic anemia.  Monitor.       Discharge diagnosis     Principal Problem:   Pneumonia Active Problems:   Lymphadenopathy   Seizure (HCC)   Leucocytosis   Anemia  Tachypnea   Low grade fever   Night sweats    Discharge instructions    Discharge Instructions     Diet - low sodium heart healthy   Complete by: As directed    Discharge instructions   Complete by: As directed    Follow with Primary MD [name], PA in 7 days   Get CBC, CMP, 2 view Chest X ray -  checked next visit within 1 week by Primary MD   Activity: As tolerated with Full fall precautions use walker/cane & assistance as needed  Disposition Home   Diet: Heart Healthy   Special Instructions: If you have smoked or chewed Tobacco  in the last 2 yrs please stop smoking, stop any regular Alcohol  and or any Recreational drug use.  On your next visit with your primary care physician please Get Medicines reviewed and adjusted.  Please request your Prim.MD to go over all Hospital Tests and Procedure/Radiological results at the follow up, please get all Hospital records sent to your Prim MD by signing hospital release before you go home.  If you experience worsening of your admission symptoms, develop shortness of breath, life threatening emergency, suicidal or homicidal thoughts you must seek medical attention immediately by calling 911 or calling your MD  immediately  if symptoms less severe.  You Must read complete instructions/literature along with all the possible adverse reactions/side effects for all the Medicines you take and that have been prescribed to you. Take any new Medicines after you have completely understood and accpet all the possible adverse reactions/side effects.   Increase activity slowly   Complete by: As directed        Discharge Medications   Allergies as of 03/28/2021   No Known Allergies      Medication List     TAKE these medications    acetaminophen 500 MG tablet Commonly known as: TYLENOL Take 1 tablet (500 mg total) by mouth every 8 (eight) hours as needed for moderate pain or fever.   albuterol 108 (90 Base) MCG/ACT inhaler Commonly known as: VENTOLIN HFA PLEASE SEE ATTACHED FOR DETAILED DIRECTIONS   azithromycin 500 MG tablet Commonly known as: Zithromax Take 1 tablet (500 mg total) by mouth daily.   Fish Oil 1000 MG Caps Take 1 capsule by mouth daily in the afternoon.   LamoTRIgine 300 MG Tb24 24 hour tablet Take 1 tablet (300 mg total) by mouth daily.   Vitamin D (Cholecalciferol) 25 MCG (1000 UT) Caps Take 1 capsule by mouth daily in the afternoon.         Follow-up Information     [name], [place]. Schedule an appointment as soon as possible for a visit in 1 week(s).   Specialty: Physician Assistant Contact information: [place] [address] [phone]         Brunetta Genera, MD. Schedule an appointment as soon as possible for a visit in 1 week(s).   Specialties: Hematology, Oncology Contact information: [place] [address] [phone]                 Major procedures and Radiology Reports - PLEASE review detailed and final reports thoroughly  -       DG Chest 2 View  Result Date: 03/23/2021 CLINICAL DATA:  sob EXAM: CHEST - 2 VIEW COMPARISON:  Chest radiograph December 20, 2014. chest CT March 14, 2021. FINDINGS: Patchy opacities in the left lung and right upper lobe.  No visible pleural effusions or pneumothorax. Cardiomediastinal silhouette is within normal limits. No acute osseous abnormality. Mild reverse S-shaped thoracolumbar curvature. IMPRESSION: Patchy opacities in the left lung and right upper lobe, concerning for multifocal pneumonia. Recommend imaging follow-up to ensure resolution. Electronically Signed   By: Margaretha Sheffield M.D.   On: 03/23/2021 16:04   CT Angio Chest PE W and/or Wo Contrast  Result Date: 03/23/2021 CLINICAL DATA:  Fever, cough, shortness of breath EXAM: CT ANGIOGRAPHY CHEST WITH CONTRAST TECHNIQUE: Multidetector CT imaging of the chest was performed using the standard protocol during bolus administration of intravenous contrast. Multiplanar CT image reconstructions and MIPs were obtained to evaluate the vascular anatomy. CONTRAST:  51mL OMNIPAQUE IOHEXOL 350 MG/ML SOLN COMPARISON:  03/14/2021 FINDINGS: Cardiovascular: No filling defects in the pulmonary arteries to suggest pulmonary emboli. Heart is normal size. Aorta is normal caliber. Scattered coronary artery calcifications and aortic calcifications. Mediastinum/Nodes: There is mediastinal, bilateral hilar and bilateral axillary adenopathy again noted, stable since prior study. Trachea and esophagus are unremarkable. Lungs/Pleura: New bilateral ground-glass airspace opacities noted with tree-in-bud nodular densities also seen bilaterally. Findings are more pronounced on the left. Findings likely reflect multifocal pneumonia. Atypical/viral pneumonia possible. Small left pleural effusion. Upper Abdomen: Upper abdominal/retroperitoneal adenopathy again noted, unchanged since prior study. Musculoskeletal: Chest wall soft tissues are unremarkable. No acute bony abnormality. Review of the MIP images confirms the above findings. IMPRESSION: Mediastinal, bilateral hilar and bilateral axillary as well as upper abdominal  adenopathy, stable since prior study. Findings are concerning for lymphoma or metastatic disease. Recommend clinical correlation. New bilateral airspace disease, left greater than right concerning for multifocal pneumonia. Small left pleural effusion. No evidence of pulmonary embolus. Coronary artery disease. Aortic Atherosclerosis (ICD10-I70.0). Electronically Signed   By: Rolm Baptise M.D.   On: 03/23/2021 18:37   CT Angio Chest PE W and/or Wo Contrast  Result Date: 03/14/2021 CLINICAL DATA:  High probability for PE. Cough and shortness of breath. EXAM: CT ANGIOGRAPHY CHEST WITH CONTRAST TECHNIQUE: Multidetector CT imaging of the chest was performed using the standard protocol during bolus administration of intravenous contrast. Multiplanar CT image reconstructions and MIPs were obtained to evaluate the vascular anatomy. CONTRAST:  41mL OMNIPAQUE IOHEXOL 350 MG/ML SOLN COMPARISON:  None. FINDINGS: Cardiovascular: Satisfactory opacification of the pulmonary arteries to the segmental level. No evidence of pulmonary embolism. Normal heart size. No pericardial effusion. Mediastinum/Nodes: There is an indeterminate 11 mm right thyroid nodule. There is an enlarged subcarinal lymph node measuring 13 mm short axis. There are enlarged bilateral hilar lymph nodes measuring up to 15 mm short axis. There also enlarged bilateral axillary lymph nodes. The largest lymph node on the left measures 1.8 by 3.7 cm in the largest lymph node on the right measures 1.3 by 2.0 cm. The esophagus is nondilated. Lungs/Pleura: Lungs are clear. No pleural effusion or pneumothorax. Upper Abdomen: There is likely adenopathy in the periaortic region image 4/136 measuring 3.7 x 1.4 cm. Musculoskeletal: No chest wall abnormality. No acute or significant osseous findings. Review of the MIP images confirms the above findings. IMPRESSION: 1. No evidence for pulmonary embolism. 2. Mediastinal, hilar, bilateral axillary and upper abdominal  lymphadenopathy of uncertain etiology. Findings may be related to lymphoma or metastatic disease. Recommend clinical correlation and follow-up. 3. 1.1 cm incidental right thyroid nodule. No follow-up imaging is recommended. Reference: J Am Coll Radiol. 2015 Feb;12(2): 143-50 Electronically Signed   By: Ronney Asters M.D.   On: 03/14/2021 23:46   CT ABDOMEN PELVIS W  CONTRAST  Result Date: 03/25/2021 CLINICAL DATA:  Productive cough, increased shortness of breath for several weeks, lymphadenopathy EXAM: CT ABDOMEN AND PELVIS WITH CONTRAST TECHNIQUE: Multidetector CT imaging of the abdomen and pelvis was performed using the standard protocol following bolus administration of intravenous contrast. CONTRAST:  133m OMNIPAQUE IOHEXOL 300 MG/ML  SOLN COMPARISON:  03/14/2021, 03/23/2021 FINDINGS: Lower chest: Bibasilar airspace disease and small left pleural effusion consistent with multifocal pneumonia. The heart is unremarkable. Hepatobiliary: No focal liver abnormality is seen. No gallstones, gallbladder wall thickening, or biliary dilatation. Pancreas: Unremarkable. No pancreatic ductal dilatation or surrounding inflammatory changes. Spleen: Normal in size without focal abnormality. Adrenals/Urinary Tract: Adrenal glands are unremarkable. Kidneys are normal, without renal calculi, focal lesion, or hydronephrosis. Bladder is unremarkable. Stomach/Bowel: No bowel obstruction or ileus. Normal appendix right lower quadrant. No bowel wall thickening or inflammatory change. Vascular/Lymphatic: Extensive mesenteric and retroperitoneal lymphadenopathy is identified. Index lymph nodes are as follows: Left para-aortic, image 34/3, 15 x 25 mm. Left external iliac, image 67/3, 21 x 29 mm. Upper abdominal mesentery, image 36/3, 15 x 27 mm. Mild atherosclerosis.  No other significant vascular findings. Reproductive: Calcified uterine fibroid left aspect uterine fundus measuring 1.7 cm. No adnexal masses. Other: No free fluid or  free gas.  No abdominal wall hernia. Musculoskeletal: No acute or destructive bony lesions. Reconstructed images demonstrate no additional findings. IMPRESSION: 1. Extensive lymphadenopathy throughout the abdomen and pelvis. Favor lymphoproliferative disorder over metastatic disease. 2. Bibasilar pneumonia and small left pleural effusion. Please refer to preceding chest CT evaluation. 3.  Aortic Atherosclerosis (ICD10-I70.0). Electronically Signed   By: MRanda NgoM.D.   On: 03/25/2021 15:29   [place] BIOPSY (LYMPH NODES)  Result Date: 03/26/2021 INDICATION: Extensive lymphadenopathy in the abdomen and pelvis. No known primary. EXAM: ULTRASOUND GUIDED CORE BIOPSY OF RIGHT INGUINAL ADENOPATHY MEDICATIONS: Lidocaine 1% subcutaneous ANESTHESIA/SEDATION: Intravenous Fentanyl 258m and Versed 50m450mere administered as conscious sedation during continuous monitoring of the patient's level of consciousness and physiological / cardiorespiratory status by the radiology RN, with a total moderate sedation time of 14 minutes. PROCEDURE: The procedure, risks, benefits, and alternatives were explained to the patient. Questions regarding the procedure were encouraged and answered. The patient understands and consents to the procedure. Survey ultrasound of the right inguinal region performed. Representative adenopathy was localized and an appropriate skin entry site was determined and marked. The operative field was prepped with chlorhexidine in a sterile fashion, and a sterile drape was applied covering the operative field. A sterile gown and sterile gloves were used for the procedure. Local anesthesia was provided with 1% Lidocaine. Under real-time ultrasound guidance, a 17 gauge trocar needle was advanced to the margin of the lesion. Once needle tip position was confirmed, coaxial 18-gauge core biopsy samples were obtained, submitted in saline to surgical pathology. The guide needle was removed. Postprocedure scans show  no hemorrhage or other apparent complication. COMPLICATIONS: None immediate. FINDINGS: Multiple enlarged right inguinal lymph nodes were identified corresponding to CT findings. Representative core biopsy samples obtained as above IMPRESSION: 1. Technically successful ultrasound-guided core biopsy, right inguinal adenopathy. Electronically Signed   By: D  Lucrezia EuropeD.   On: 03/26/2021 16:23   VAS US [place] EXTREMITY VENOUS (DVT)  Result Date: 03/25/2021  Lower Venous DVT Study Patient Name:  SHILAMETRIA KLUNKate of Exam:   03/25/2021 Medical Rec #: [id number]    Accession #:    [id number] of Birth: 6/223-Apr-1952  Patient Gender: F Patient Age:   69 years Exam Location:  [facility] Procedure:      VAS [place] LOWER EXTREMITY VENOUS (DVT) Referring Phys: PRANAV PATEL --------------------------------------------------------------------------------  Indications: Edema.  Comparison Study: no prior Performing Technologist: Archie Patten RVS  Examination Guidelines: A complete evaluation includes B-mode imaging, spectral Doppler, color Doppler, and power Doppler as needed of all accessible portions of each vessel. Bilateral testing is considered an integral part of a complete examination. Limited examinations for reoccurring indications may be performed as noted. The reflux portion of the exam is performed with the patient in reverse Trendelenburg.  +---------+---------------+---------+-----------+----------+--------------+ RIGHT    CompressibilityPhasicitySpontaneityPropertiesThrombus Aging +---------+---------------+---------+-----------+----------+--------------+ CFV      Full           Yes      Yes                                 +---------+---------------+---------+-----------+----------+--------------+ SFJ      Full                                                        +---------+---------------+---------+-----------+----------+--------------+ FV Prox  Full                                                         +---------+---------------+---------+-----------+----------+--------------+ FV Mid   Full                                                        +---------+---------------+---------+-----------+----------+--------------+ FV DistalFull                                                        +---------+---------------+---------+-----------+----------+--------------+ PFV      Full                                                        +---------+---------------+---------+-----------+----------+--------------+ POP      Full           Yes      Yes                                 +---------+---------------+---------+-----------+----------+--------------+ PTV      Full                                                        +---------+---------------+---------+-----------+----------+--------------+ PERO  Full                                                        +---------+---------------+---------+-----------+----------+--------------+   +---------+---------------+---------+-----------+----------+--------------+ LEFT     CompressibilityPhasicitySpontaneityPropertiesThrombus Aging +---------+---------------+---------+-----------+----------+--------------+ CFV      Full           Yes      Yes                                 +---------+---------------+---------+-----------+----------+--------------+ SFJ      Full                                                        +---------+---------------+---------+-----------+----------+--------------+ FV Prox  Full                                                        +---------+---------------+---------+-----------+----------+--------------+ FV Mid   Full                                                        +---------+---------------+---------+-----------+----------+--------------+ FV DistalFull                                                         +---------+---------------+---------+-----------+----------+--------------+ PFV      Full                                                        +---------+---------------+---------+-----------+----------+--------------+ POP      Full           Yes      Yes                                 +---------+---------------+---------+-----------+----------+--------------+ PTV      Full                                                        +---------+---------------+---------+-----------+----------+--------------+ PERO     Full                                                        +---------+---------------+---------+-----------+----------+--------------+  Summary: BILATERAL: - No evidence of deep vein thrombosis seen in the lower extremities, bilaterally. -No evidence of popliteal cyst, bilaterally.   *See table(s) above for measurements and observations. Electronically signed by Harold Barban MD on 03/25/2021 at 9:25:17 PM.    Final       Today   Subjective    Arlester Marker today has no headache,no chest abdominal pain,no new weakness tingling or numbness, feels much better wants to go home today.     Objective   Blood pressure 110/55, pulse 72, temperature 98.6 F (37 C), temperature source Oral, resp. rate 19, height _0  (1.676 m), weight 75 kg, SpO2 95 %.   Intake/Output Summary (Last 24 hours) at 03/28/2021 1025 Last data filed at 03/27/2021 2351 Gross per 24 hour  Intake 340 ml  Output --  Net 340 ml    Exam  Awake Alert, No new F.N deficits, Normal affect [place].AT,PERRAL Supple Neck,No JVD, No cervical lymphadenopathy appriciated.  Symmetrical Chest wall movement, Good air movement bilaterally, CTAB RRR,No Gallops,Rubs or new Murmurs, No Parasternal Heave +ve B.Sounds, Abd Soft, Non tender, No organomegaly appriciated, No rebound -guarding or rigidity. No Cyanosis, Clubbing or edema, No new Rash or bruise   Data Review   CBC w Diff:  Lab Results   Component Value Date   WBC 11.8 (H) 03/28/2021   HGB 8.4 (L) 03/28/2021   HGB 11.3 12/14/2018   HCT 26.8 (L) 03/28/2021   HCT 35.7 12/14/2018   PLT 453 (H) 03/28/2021   PLT 289 12/14/2018   LYMPHOPCT 37 03/28/2021   MONOPCT 14 03/28/2021   EOSPCT 5 03/28/2021   BASOPCT 1 03/28/2021    CMP:  Lab Results  Component Value Date   NA 137 03/28/2021   NA 146 (H) 12/14/2018   K 3.7 03/28/2021   CL 105 03/28/2021   CO2 25 03/28/2021   BUN 7 (L) 03/28/2021   BUN 9 12/14/2018   CREATININE 0.55 03/28/2021   PROT 5.9 (L) 03/28/2021   PROT 7.0 12/14/2018   ALBUMIN 2.5 (L) 03/28/2021   ALBUMIN 4.6 12/14/2018   BILITOT 0.2 (L) 03/28/2021   BILITOT 0.3 12/14/2018   ALKPHOS 110 03/28/2021   AST 51 (H) 03/28/2021   ALT 42 03/28/2021  .   Total Time in preparing paper work, data evaluation and todays exam - 41 minutes  Lala Lund M.D on 03/28/2021 at 10:25 AM  [facility]

## 2021-03-28 NOTE — Discharge Instructions (Signed)
Follow with Primary MD [name], PA in 7 days   Get CBC, CMP, 2 view Chest X ray -  checked next visit within 1 week by Primary MD   Activity: As tolerated with Full fall precautions use walker/cane & assistance as needed  Disposition Home   Diet: Heart Healthy   Special Instructions: If you have smoked or chewed Tobacco  in the last 2 yrs please stop smoking, stop any regular Alcohol  and or any Recreational drug use.  On your next visit with your primary care physician please Get Medicines reviewed and adjusted.  Please request your Prim.MD to go over all Hospital Tests and Procedure/Radiological results at the follow up, please get all Hospital records sent to your Prim MD by signing hospital release before you go home.  If you experience worsening of your admission symptoms, develop shortness of breath, life threatening emergency, suicidal or homicidal thoughts you must seek medical attention immediately by calling 911 or calling your MD immediately  if symptoms less severe.  You Must read complete instructions/literature along with all the possible adverse reactions/side effects for all the Medicines you take and that have been prescribed to you. Take any new Medicines after you have completely understood and accpet all the possible adverse reactions/side effects.

## 2021-03-30 ENCOUNTER — Telehealth: Payer: Self-pay | Admitting: Hematology

## 2021-03-30 ENCOUNTER — Telehealth: Payer: Self-pay

## 2021-03-30 LAB — CULTURE, BLOOD (ROUTINE X 2)
Culture: NO GROWTH
Culture: NO GROWTH
Special Requests: ADEQUATE
Special Requests: ADEQUATE

## 2021-03-30 LAB — SURGICAL PATHOLOGY

## 2021-03-30 NOTE — Telephone Encounter (Cosign Needed)
Transition Care Management Follow-up Telephone Call Date of discharge and from where: [facility] 03/28/21 How have you been since you were released from the hospital? Doing ok Any questions or concerns? No  Items Reviewed: Did the pt receive and understand the discharge instructions provided? Yes  Medications obtained and verified? Yes  Other? No  Any new allergies since your discharge? No  Dietary orders reviewed? Yes Do you have support at home? Yes   Home Care and Equipment/Supplies: Were home health services ordered? not applicable If so, what is the name of the agency?   Has the agency set up a time to come to the patient's home? not applicable Were any new equipment or medical supplies ordered?  No What is the name of the medical supply agency?  Were you able to get the supplies/equipment? not applicable Do you have any questions related to the use of the equipment or supplies? No  Functional Questionnaire: (I = Independent and D = Dependent) ADLs: I  Bathing/Dressing- I  Meal Prep- I  Eating- I  Maintaining continence- I  Transferring/Ambulation- I  Managing Meds- I  Follow up appointments reviewed:  PCP Hospital f/u appt confirmed? No  PT WILL SCHEDULE APPT. [facility] f/u appt confirmed? Yes  Scheduled to see Dr Irene Limbo  on 04/02/21 @ 12:00. Are transportation arrangements needed? No  If their condition worsens, is the pt aware to call PCP or go to the Emergency Dept.? Yes Was the patient provided with contact information for the PCP's office or ED? Yes Was to pt encouraged to call back with questions or concerns? Yes

## 2021-03-30 NOTE — Telephone Encounter (Signed)
Scheduled appt per 10/14 staff msg from Dr. Irene Limbo. Called pt, no answer. Left msg with appt date and time. Also left my direct phone number for pt to call back to confirm appt.

## 2021-03-31 ENCOUNTER — Ambulatory Visit: Payer: BC Managed Care – PPO | Admitting: Physical Therapy

## 2021-03-31 ENCOUNTER — Encounter: Payer: Self-pay | Admitting: Physician Assistant

## 2021-03-31 LAB — CULTURE, BLOOD (ROUTINE X 2)
Culture: NO GROWTH
Culture: NO GROWTH
Special Requests: ADEQUATE
Special Requests: ADEQUATE

## 2021-04-01 ENCOUNTER — Encounter: Payer: Self-pay | Admitting: Physician Assistant

## 2021-04-01 ENCOUNTER — Other Ambulatory Visit: Payer: Self-pay

## 2021-04-01 ENCOUNTER — Ambulatory Visit (INDEPENDENT_AMBULATORY_CARE_PROVIDER_SITE_OTHER): Payer: BC Managed Care – PPO | Admitting: Physician Assistant

## 2021-04-01 VITALS — BP 100/60 | HR 73 | Temp 98.1°F | Ht 66.0 in | Wt 159.5 lb

## 2021-04-01 DIAGNOSIS — J189 Pneumonia, unspecified organism: Secondary | ICD-10-CM | POA: Diagnosis not present

## 2021-04-01 DIAGNOSIS — R591 Generalized enlarged lymph nodes: Secondary | ICD-10-CM

## 2021-04-01 NOTE — Progress Notes (Signed)
Katie Mcneil is a 70 y.o. female here for a follow up.   History of Present Illness:   Chief Complaint  Patient presents with   Hospitalization Follow-up   Pneumonia    HPI  Katie Mcneil presented to today's visit with her daughter.   Hospitalization F/U- Pneumonia  On March 23, 2021, Katie Mcneil was admitted to [facility] upon presenting with c/o low grade fever, SOB, and cough which had been onset for ten days. Upon admission she had a CT scan performed that revealed possible atypical pneumonia with diffuse lymphadenopathy in the chest. It was noted she had similar results a few weeks prior and was expected to visit general surgery outpatient as well as have an outpatient PET scan completed.   Katie Mcneil was treated for possible atypical pneumonia although clinically it looks more consistent with low-grade lymphoma. She has been compliant with azithromycin 500 mg x 3 since d/c with no adverse effects. All her cultures were negative at that time.   Currently she has completed all medication given while hospitalized as of yesterday and is scheduled to receive an additional CXR within a week. She does still have a non-productive cough, but is not taking anything other than cough drops.    Diffuse Chest Lymphadenopathy At the time of hospitalization, LDH was mildly elevated but ESR and CRP were significantly elevated. She was seen by hematology along with IR and underwent inguinal node biopsy prelim, results consistent with non-Hodgkin B-cell lymphoma. Post discharge she will follow-up provider that she saw in th hospital Dr. Irene Limbo, Oncology.    Past Medical History:  Diagnosis Date   Dyslipidemia    Seizures (HCC)      Social History   Tobacco Use   Smoking status: Never   Smokeless tobacco: Never  Vaping Use   Vaping Use: Never used  Substance Use Topics   Alcohol use: Not Currently    Alcohol/week: 0.0 standard drinks   Drug use: No    Past Surgical History:   Procedure Laterality Date   CATARACT EXTRACTION W/ INTRAOCULAR LENS IMPLANT Bilateral 2019   CESAREAN SECTION  1981, 1990   NO PAST SURGERIES     TUBAL LIGATION      Family History  Problem Relation Age of Onset   Neuropathy Mother    Anuerysm Father    Leukemia Sister 2   Alzheimer's disease Sister 59   Dementia Brother    Heart attack Maternal Grandmother    CVA Maternal Grandfather    Dementia Paternal Grandmother    Seizures Daughter    Other Daughter        Psychological disorder   Colon cancer Neg Hx     No Known Allergies  Current Medications:   Current Outpatient Medications:    acetaminophen (TYLENOL) 500 MG tablet, Take 1 tablet (500 mg total) by mouth every 8 (eight) hours as needed for moderate pain or fever., Disp: 25 tablet, Rfl: 0   albuterol (VENTOLIN HFA) 108 (90 Base) MCG/ACT inhaler, PLEASE SEE ATTACHED FOR DETAILED DIRECTIONS, Disp: , Rfl:    LamoTRIgine 300 MG TB24 24 hour tablet, Take 1 tablet (300 mg total) by mouth daily., Disp: 90 tablet, Rfl: 3   Omega-3 Fatty Acids (FISH OIL) 1000 MG CAPS, Take 1 capsule by mouth daily in the afternoon. (Patient not taking: Reported on 04/01/2021), Disp: , Rfl:    Vitamin D, Cholecalciferol, 25 MCG (1000 UT) CAPS, Take 1 capsule by mouth daily in the afternoon. (Patient not taking: Reported  on 04/01/2021), Disp: , Rfl:    Review of Systems:   ROS Negative unless otherwise specified per HPI.  Vitals:   Vitals:   04/01/21 1324  BP: 100/60  Pulse: 73  Temp: 98.1 F (36.7 C)  TempSrc: Temporal  SpO2: 99%  Weight: 159 lb 8 oz (72.3 kg)  Height: _0  (1.676 m)     Body mass index is 25.74 kg/m.  Physical Exam:   Physical Exam Vitals and nursing note reviewed.  Constitutional:      General: She is not in acute distress.    Appearance: She is well-developed. She is not ill-appearing or toxic-appearing.  Cardiovascular:     Rate and Rhythm: Normal rate and regular rhythm.     Pulses: Normal pulses.      Heart sounds: Normal heart sounds, S1 normal and S2 normal.  Pulmonary:     Effort: Pulmonary effort is normal.     Breath sounds: Normal breath sounds.  Skin:    General: Skin is warm and dry.  Neurological:     Mental Status: She is alert.     GCS: GCS eye subscore is 4. GCS verbal subscore is 5. GCS motor subscore is 6.  Psychiatric:        Speech: Speech normal.        Behavior: Behavior normal. Behavior is cooperative.    Assessment and Plan:   Pneumonia of right upper lobe due to infectious organism Clinically improving signficantly Appears well Will repeat CXR next week Follow-up with [place] for any further concerns  Lymphadenopathy Management per Dr. Irene Limbo with [facility] -- she has appt tomorrow. Will send this information to Dr. Sharin Grave office so he is aware that they will be taking over care. Appreciate initial consult greatly.  I,Havlyn C Ratchford,acting as a Education administrator for [employer], PA.,have documented all relevant documentation on the behalf of Katie Coke, PA,as directed by  Katie Coke, PA while in the presence of Katie Mcneil, [place].  I, Katie Mcneil, [place], have reviewed all documentation for this visit. The documentation on 04/01/21 for the exam, diagnosis, procedures, and orders are all accurate and complete.  Time spent with patient today was 25 minutes which consisted of chart review, discussing diagnosis, work up, treatment answering questions and documentation.   Katie Coke, PA-C

## 2021-04-01 NOTE — Patient Instructions (Signed)
It was great to see you!  An order for a chest xray has been put in for you --> please get this sometime next week. To get your xray, you can walk in at the [facility] location without a scheduled appointment.  The address is [address]. It is across the street from [place] is located in the basement.  Hours of operation are M-F 8:30am to 5:00pm. Please note that they are closed for lunch between 12:30 and 1:00pm.  Take care,  [name] PA-C

## 2021-04-02 ENCOUNTER — Inpatient Hospital Stay: Payer: BC Managed Care – PPO | Attending: Hematology | Admitting: Hematology

## 2021-04-02 ENCOUNTER — Inpatient Hospital Stay: Payer: BC Managed Care – PPO

## 2021-04-02 VITALS — BP 111/59 | HR 83 | Temp 97.9°F | Resp 17 | Ht 66.0 in | Wt 158.2 lb

## 2021-04-02 DIAGNOSIS — I700 Atherosclerosis of aorta: Secondary | ICD-10-CM | POA: Diagnosis not present

## 2021-04-02 DIAGNOSIS — G40909 Epilepsy, unspecified, not intractable, without status epilepticus: Secondary | ICD-10-CM | POA: Insufficient documentation

## 2021-04-02 DIAGNOSIS — C8298 Follicular lymphoma, unspecified, lymph nodes of multiple sites: Secondary | ICD-10-CM | POA: Diagnosis not present

## 2021-04-02 DIAGNOSIS — Z79899 Other long term (current) drug therapy: Secondary | ICD-10-CM | POA: Diagnosis not present

## 2021-04-02 DIAGNOSIS — E785 Hyperlipidemia, unspecified: Secondary | ICD-10-CM | POA: Insufficient documentation

## 2021-04-02 DIAGNOSIS — R7402 Elevation of levels of lactic acid dehydrogenase (LDH): Secondary | ICD-10-CM | POA: Diagnosis not present

## 2021-04-02 DIAGNOSIS — Z806 Family history of leukemia: Secondary | ICD-10-CM | POA: Diagnosis not present

## 2021-04-02 DIAGNOSIS — C8228 Follicular lymphoma grade III, unspecified, lymph nodes of multiple sites: Secondary | ICD-10-CM | POA: Insufficient documentation

## 2021-04-02 DIAGNOSIS — D649 Anemia, unspecified: Secondary | ICD-10-CM | POA: Insufficient documentation

## 2021-04-02 DIAGNOSIS — Z86718 Personal history of other venous thrombosis and embolism: Secondary | ICD-10-CM | POA: Insufficient documentation

## 2021-04-02 DIAGNOSIS — J90 Pleural effusion, not elsewhere classified: Secondary | ICD-10-CM | POA: Diagnosis not present

## 2021-04-02 DIAGNOSIS — I2510 Atherosclerotic heart disease of native coronary artery without angina pectoris: Secondary | ICD-10-CM | POA: Insufficient documentation

## 2021-04-02 DIAGNOSIS — C8510 Unspecified B-cell lymphoma, unspecified site: Secondary | ICD-10-CM | POA: Insufficient documentation

## 2021-04-02 DIAGNOSIS — S52571A Other intraarticular fracture of lower end of right radius, initial encounter for closed fracture: Secondary | ICD-10-CM | POA: Diagnosis not present

## 2021-04-02 DIAGNOSIS — E041 Nontoxic single thyroid nodule: Secondary | ICD-10-CM | POA: Diagnosis not present

## 2021-04-02 DIAGNOSIS — Z86711 Personal history of pulmonary embolism: Secondary | ICD-10-CM | POA: Diagnosis not present

## 2021-04-02 LAB — CBC WITH DIFFERENTIAL/PLATELET
Abs Immature Granulocytes: 0.03 10*3/uL (ref 0.00–0.07)
Basophils Absolute: 0.1 10*3/uL (ref 0.0–0.1)
Basophils Relative: 1 %
Eosinophils Absolute: 0.5 10*3/uL (ref 0.0–0.5)
Eosinophils Relative: 6 %
HCT: 33.2 % — ABNORMAL LOW (ref 36.0–46.0)
Hemoglobin: 10.3 g/dL — ABNORMAL LOW (ref 12.0–15.0)
Immature Granulocytes: 0 %
Lymphocytes Relative: 50 %
Lymphs Abs: 3.6 10*3/uL (ref 0.7–4.0)
MCH: 27.7 pg (ref 26.0–34.0)
MCHC: 31 g/dL (ref 30.0–36.0)
MCV: 89.2 fL (ref 80.0–100.0)
Monocytes Absolute: 0.5 10*3/uL (ref 0.1–1.0)
Monocytes Relative: 7 %
Neutro Abs: 2.7 10*3/uL (ref 1.7–7.7)
Neutrophils Relative %: 36 %
Platelets: 594 10*3/uL — ABNORMAL HIGH (ref 150–400)
RBC: 3.72 MIL/uL — ABNORMAL LOW (ref 3.87–5.11)
RDW: 13.7 % (ref 11.5–15.5)
WBC: 7.4 10*3/uL (ref 4.0–10.5)
nRBC: 0 % (ref 0.0–0.2)

## 2021-04-02 LAB — CMP (CANCER CENTER ONLY)
ALT: 33 U/L (ref 0–44)
AST: 21 U/L (ref 15–41)
Albumin: 3.7 g/dL (ref 3.5–5.0)
Alkaline Phosphatase: 104 U/L (ref 38–126)
Anion gap: 7 (ref 5–15)
BUN: 9 mg/dL (ref 8–23)
CO2: 25 mmol/L (ref 22–32)
Calcium: 9.3 mg/dL (ref 8.9–10.3)
Chloride: 106 mmol/L (ref 98–111)
Creatinine: 0.67 mg/dL (ref 0.44–1.00)
GFR, Estimated: >60 mL/min (ref >60)
Glucose, Bld: 96 mg/dL (ref 70–99)
Potassium: 3.9 mmol/L (ref 3.5–5.1)
Sodium: 138 mmol/L (ref 135–145)
Total Bilirubin: <0.2 mg/dL — ABNORMAL LOW (ref 0.3–1.2)
Total Protein: 7.9 g/dL (ref 6.5–8.1)

## 2021-04-02 LAB — SAMPLE TO BLOOD BANK

## 2021-04-02 LAB — HEPATITIS B SURFACE ANTIGEN: Hepatitis B Surface Ag: NONREACTIVE

## 2021-04-02 LAB — HEPATITIS C ANTIBODY: HCV Ab: NONREACTIVE

## 2021-04-02 LAB — VITAMIN B12: Vitamin B-12: 789 pg/mL (ref 180–914)

## 2021-04-02 LAB — LACTATE DEHYDROGENASE: LDH: 181 U/L (ref 98–192)

## 2021-04-02 LAB — DIRECT ANTIGLOBULIN TEST (NOT AT ARMC)
DAT, IgG: NEGATIVE
DAT, complement: NEGATIVE

## 2021-04-02 LAB — HEPATITIS B CORE ANTIBODY, TOTAL: Hep B Core Total Ab: NONREACTIVE

## 2021-04-02 NOTE — Patient Instructions (Signed)
Thank you for choosing [facility] to provide your care.   Should you have questions after your visit to the [facility] ([facility]), please contact this office at [phone] between 8:30 AM and 4:30 PM.  Voice mails left after 4:00 PM may not be returned until the following business day.  Calls received after 4:30 PM will be answered by an off-site Nurse Triage Line.    Prescription Refills:  Please have your pharmacy contact us directly for most prescription requests.  Contact the office directly for refills of narcotics (pain medications). Allow 48-72 hours for refills.  Appointments: Please contact the [facility] scheduling department [phone] for questions regarding [facility] appointment scheduling.  Contact the schedulers with any scheduling changes so that your appointment can be rescheduled in a timely manner.   Central Scheduling for [place] [phone] - Call to schedule procedures such as PET scans, CT scans, MRI, Ultrasound, etc.  To afford each patient quality time with our providers, please arrive 30 minutes before your scheduled appointment time.  If you arrive late for your appointment, you may be asked to reschedule.  We strive to give you quality time with our providers, and arriving late affects you and other patients whose appointments are after yours. If you are a no show for multiple scheduled visits, you may be dismissed from the clinic at the providers discretion.     Resources: [facility] Social Workers [phone] for additional information on assistance programs or assistance connecting with community support programs   [place] DSS  [phone]: Information regarding food stamps, Medicaid, and utility assistance [employer] [place] [phone]   [place] [employer]'s shared-ride transportation service for eligible riders who have a disability that prevents them from riding the fixed route bus.   Medicare Rights Center 800-333-4114  Helps people with Medicare understand their rights and benefits, navigate the Medicare system, and secure the quality healthcare they deserve [employer] 800-227-2345 Assists patients locate various types of support and financial assistance Cancer Care: 1-800-813-HOPE (4673) Provides financial assistance, online support groups, medication/co-pay assistance.   Transportation Assistance for appointments at [facility]: Transportation Coordinator [phone]  Again, thank you for choosing [facility] for your care.       

## 2021-04-03 LAB — IRON AND TIBC
Iron: 48 ug/dL (ref 28–170)
Saturation Ratios: 20 % (ref 10.4–31.8)
TIBC: 234 ug/dL — ABNORMAL LOW (ref 250–450)
UIBC: 186 ug/dL

## 2021-04-03 LAB — FOLATE RBC
Folate, Hemolysate: 287 ng/mL
Folate, RBC: 911 ng/mL (ref >498)
Hematocrit: 31.5 % — ABNORMAL LOW (ref 34.0–46.6)

## 2021-04-03 LAB — HAPTOGLOBIN: Haptoglobin: 174 mg/dL (ref 37–355)

## 2021-04-03 LAB — FERRITIN: Ferritin: 131 ng/mL (ref 11–307)

## 2021-04-08 NOTE — Progress Notes (Addendum)
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 female who has been referred to [place] by Dr Lala Lund for evaluation and management of generalized lymphadenopathy concerning for possible lymphoma.  Patient has a history of seizure disorder and dyslipidemia and initially presented to the emergency room on 03/14/2021 with symptoms of cough, congestion and shortness of breath.  Due to mildly elevated D-dimer she had a CTA of the chest on 03/14/2021 which showed no evidence of PE but she was noted to have mediastinal, hilar, bilateral axillary and upper abdominal lymphadenopathy of uncertain etiology. She was recommended to follow-up with her primary care physician for further evaluation of her lymphadenopathy on discharge from the emergency room.  Patient returns with low-grade fevers as well as ongoing cough and shortness of breath as well as worsening leukocytosis and persistent fevers.  She was tested for COVID 19 and was negative on 2 occasions. Repeat CTA chest on 03/23/2021 show persistent Mediastinal, bilateral hilar and bilateral axillary as well as upper abdominal adenopathy, stable since prior study.  She was also noted to have new bilateral airspace disease left greater than right concerning for multifocal pneumonia. No evidence of PE.  Small left pleural effusion.  Patient has been started on ceftriaxone and azithromycin for community-acquired multifocal pneumonia and is being worked up for infectious etiology for her pneumonia.   No history of dysphagia or aspiration.  No nausea no vomiting no diarrhea. Note history of drug use or vaping.  INTERVAL HISTORY  Patient was seen in posthospitalization  follow-up for evaluation of her generalized lymphadenopathy and to follow-up on the results of her lymph node biopsy. She reported some waxing and waning lymph nodes in the neck for a few years but on recent admission to the hospital for pneumonia was noted to have generalized lymphadenopathy leading to a lymph node biopsy.  Patient notes her breathing is nearly back to baseline.  Her cough is resolved she has had no fevers and has been eating better. 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  Grading was not accurately possible FISH for t(14;18) was pending.  Labs done today 04/02/2021 were reviewed with the patient.   MEDICAL HISTORY:  Past Medical History:  Diagnosis Date   Dyslipidemia    Seizures ([place])     SURGICAL HISTORY: Past Surgical History:  Procedure Laterality Date   CATARACT EXTRACTION W/ INTRAOCULAR LENS IMPLANT Bilateral 2019   CESAREAN SECTION  1981, 1990   NO PAST SURGERIES     TUBAL LIGATION      SOCIAL HISTORY: Social History   Socioeconomic History   Marital status: Widowed    Spouse name: Not on file   Number of children: 2   Years of education: Not on file   Highest education level: Not on file  Occupational History   Not on file  Tobacco Use   Smoking status: Never   Smokeless tobacco: Never  Vaping Use   Vaping Use: Never used  Substance and Sexual Activity   Alcohol use: Not Currently    Alcohol/week: 0.0 standard drinks   Drug use: No   Sexual activity: Not Currently  Other Topics Concern   Not on file  Social History Narrative   Works in Engineer, 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[employer], contracting   Lives with daughter (who has mental health issues)   Caregiver for mother   Has two children   Widowed   Social Determinants of Radio broadcast assistant Strain: Not on file  Food Insecurity: Not on file  Transportation Needs: Not on file  Physical  Activity: Not on file  Stress: Not on file  Social Connections: Not on file  Intimate Partner Violence: Not on file    FAMILY HISTORY: Family History  Problem Relation Age of Onset   Neuropathy Mother    Anuerysm Father    Leukemia Sister 71   Alzheimer's disease Sister 30   Dementia Brother    Heart attack Maternal Grandmother    CVA Maternal Grandfather    Dementia Paternal Grandmother    Seizures Daughter    Other Daughter        Psychological disorder   Colon cancer Neg Hx     ALLERGIES:  has No Known Allergies.  MEDICATIONS:  Current Outpatient Medications  Medication Sig Dispense Refill   acetaminophen (TYLENOL) 500 MG tablet Take 1 tablet (500 mg total) by mouth every 8 (eight) hours as needed for moderate pain or fever. 25 tablet 0   albuterol (VENTOLIN HFA) 108 (90 Base) MCG/ACT inhaler PLEASE SEE ATTACHED FOR DETAILED DIRECTIONS     LamoTRIgine 300 MG TB24 24 hour tablet Take 1 tablet (300 mg total) by mouth daily. 90 tablet 3   Omega-3 Fatty Acids (FISH OIL) 1000 MG CAPS Take 1 capsule by mouth daily in the afternoon. (Patient not taking: Reported on 04/01/2021)     Vitamin D, Cholecalciferol, 25 MCG (1000 UT) CAPS Take 1 capsule by mouth daily in the afternoon. (Patient not taking: Reported on 04/01/2021)     No current facility-administered medications for this visit.    REVIEW OF SYSTEMS:   .10 Point review of Systems was done is negative except as noted above.  PHYSICAL EXAMINATION: ECOG PERFORMANCE STATUS: 1 - Symptomatic but completely ambulatory  . Vitals:   04/02/21 1217  BP: (!) 111/59  Pulse: 83  Resp: 17  Temp: 97.9 F (36.6 C)  SpO2: 100%   Filed Weights   04/02/21 1217  Weight: 158 lb 3.2 oz (71.8 kg)   .Body mass index is 25.53 kg/m.  [name] GENERAL:alert, in no acute distress and comfortable SKIN: no acute rashes, no significant lesions EYES: conjunctiva are pink and non-injected, sclera anicteric OROPHARYNX: MMM, no exudates, no  oropharyngeal erythema or ulceration NECK: supple, no JVD LYMPH: Small borderline enlarged palpable lymph nodes bilateral neck. LUNGS: clear to auscultation b/l with normal respiratory effort HEART: regular rate & rhythm ABDOMEN:  normoactive bowel sounds , non tender, not distended. Extremity: no pedal edema PSYCH: alert & oriented x 3 with fluent speech NEURO: no focal motor/sensory deficits   LABORATORY DATA:  I have reviewed the data as listed  . CBC Latest Ref Rng & Units 04/02/2021 04/02/2021 03/28/2021  WBC 4.0 - 10.5 K/uL 7.4 - 11.8(H)  Hemoglobin 12.0 - 15.0 g/dL 10.3(L) - 8.4(L)  Hematocrit 34.0 - 46.6 % 31.5(L) 33.2(L) 26.8(L)  Platelets 150 - 400 K/uL 594(H) - 453(H)   .CBC    Component Value Date/Time   WBC 7.4 04/02/2021 1303   RBC 3.72 (L) 04/02/2021 1303   HGB 10.3 (L) 04/02/2021 1303   HGB 11.3 12/14/2018 1017   HCT 31.5 (L) 04/02/2021 1304   HCT 33.2 (L) 04/02/2021 1303  PLT 594 (H) 04/02/2021 1303   PLT 289 12/14/2018 1017   MCV 89.2 04/02/2021 1303   MCV 87 12/14/2018 1017   MCH 27.7 04/02/2021 1303   MCHC 31.0 04/02/2021 1303   RDW 13.7 04/02/2021 1303   RDW 13.6 12/14/2018 1017   LYMPHSABS 3.6 04/02/2021 1303   LYMPHSABS 1.3 12/14/2018 1017   MONOABS 0.5 04/02/2021 1303   EOSABS 0.5 04/02/2021 1303   EOSABS 0.3 12/14/2018 1017   BASOSABS 0.1 04/02/2021 1303   BASOSABS 0.0 12/14/2018 1017    CMP Latest Ref Rng & Units 04/02/2021 03/28/2021 03/27/2021  Glucose 70 - 99 mg/dL 96 107(H) 105(H)  BUN 8 - 23 mg/dL 9 7(L) <5(L)  Creatinine 0.44 - 1.00 mg/dL 0.67 0.55 0.58  Sodium 135 - 145 mmol/L 138 137 136  Potassium 3.5 - 5.1 mmol/L 3.9 3.7 3.7  Chloride 98 - 111 mmol/L 106 105 105  CO2 22 - 32 mmol/L '25 25 24  ' Calcium 8.9 - 10.3 mg/dL 9.3 8.5(L) 8.6(L)  Total Protein 6.5 - 8.1 g/dL 7.9 5.9(L) 6.0(L)  Total Bilirubin 0.3 - 1.2 mg/dL <0.2(L) 0.2(L) 0.3  Alkaline Phos 38 - 126 U/L 104 110 113  AST 15 - 41 U/L 21 51(H) 34  ALT 0 - 44 U/L 33 42  37   . Lab Results  Component Value Date   LDH 181 04/02/2021   Component     Latest Ref Rng & Units 04/02/2021  Iron     28 - 170 ug/dL 48  TIBC     250 - 450 ug/dL 234 (L)  Saturation Ratios     10.4 - 31.8 % 20  UIBC     ug/dL 186  Folate, Hemolysate     Not Estab. ng/mL 287.0  HCT     34.0 - 46.6 % 31.5 (L)  Folate, RBC     >498 ng/mL 911  DAT, complement      NEG  DAT, IgG      NEG . . .  Hep B Core Total Ab     NON REACTIVE NON REACTIVE  Hepatitis B Surface Ag     NON REACTIVE NON REACTIVE  HCV Ab     NON REACTIVE NON REACTIVE  Haptoglobin     37 - 355 mg/dL 174  Ferritin     11 - 307 ng/mL 131  Vitamin B12     180 - 914 pg/mL 789  LDH     98 - 192 U/L 181    SURGICAL PATHOLOGY  CASE: [id number]  PATIENT: [place]  Surgical Pathology Report      Clinical History: diffuse LAN, no primary, R/O lymphoma (cm)    FINAL MICROSCOPIC DIAGNOSIS:   A. LYMPH NODE, RIGHT INGUINAL, NEEDLE CORE BIOPSY:  -  Non-Hodgkin B-cell lymphoma  -  See comment   COMMENT:   The biopsy consists of three small lymph node cores composed of a  monotonous proliferation of small to medium sized lymphocytes without  well-formed, back to back follicles.  By immunohistochemistry there is a  slight predominance of B cells by CD20 and CD3 immunohistochemistry.  The B cells appear to co-express CD79a, CD10, BCL6 and Bcl-2 both within  follicles and within the interfollicular spaces. CD23 shows expanded  dendritic meshworks. The B-cells are negative for CD5 and cyclin D1.  Flow cytometry identified a kappa-restricted monoclonal B-cell  population expressing CD10 (42% of all lymphocytes; See [id number]).  Overall, the findings are consistent with a non-Hodgkin B-cell  lymphoma,  most likely follicular lymphoma.  [place] for S(01;09) is pending and will  be reported in an addendum along with ki-67.  Unfortunately, given the  limited and fragmented nature of the sample  accurate grading of the  lymphoma is not possible on the sample provided.   RADIOGRAPHIC STUDIES: I have personally reviewed the radiological images as listed and agreed with the findings in the report. DG Chest 2 View  Result Date: 03/23/2021 CLINICAL DATA:  sob EXAM: CHEST - 2 VIEW COMPARISON:  Chest radiograph December 20, 2014. chest CT March 14, 2021. FINDINGS: Patchy opacities in the left lung and right upper lobe. No visible pleural effusions or pneumothorax. Cardiomediastinal silhouette is within normal limits. No acute osseous abnormality. Mild reverse S-shaped thoracolumbar curvature. IMPRESSION: Patchy opacities in the left lung and right upper lobe, concerning for multifocal pneumonia. Recommend imaging follow-up to ensure resolution. Electronically Signed   By: Margaretha Sheffield M.D.   On: 03/23/2021 16:04   CT Angio Chest PE W and/or Wo Contrast  Result Date: 03/23/2021 CLINICAL DATA:  Fever, cough, shortness of breath EXAM: CT ANGIOGRAPHY CHEST WITH CONTRAST TECHNIQUE: Multidetector CT imaging of the chest was performed using the standard protocol during bolus administration of intravenous contrast. Multiplanar CT image reconstructions and MIPs were obtained to evaluate the vascular anatomy. CONTRAST:  76m OMNIPAQUE IOHEXOL 350 MG/ML SOLN COMPARISON:  03/14/2021 FINDINGS: Cardiovascular: No filling defects in the pulmonary arteries to suggest pulmonary emboli. Heart is normal size. Aorta is normal caliber. Scattered coronary artery calcifications and aortic calcifications. Mediastinum/Nodes: There is mediastinal, bilateral hilar and bilateral axillary adenopathy again noted, stable since prior study. Trachea and esophagus are unremarkable. Lungs/Pleura: New bilateral ground-glass airspace opacities noted with tree-in-bud nodular densities also seen bilaterally. Findings are more pronounced on the left. Findings likely reflect multifocal pneumonia. Atypical/viral pneumonia possible. Small  left pleural effusion. Upper Abdomen: Upper abdominal/retroperitoneal adenopathy again noted, unchanged since prior study. Musculoskeletal: Chest wall soft tissues are unremarkable. No acute bony abnormality. Review of the MIP images confirms the above findings. IMPRESSION: Mediastinal, bilateral hilar and bilateral axillary as well as upper abdominal adenopathy, stable since prior study. Findings are concerning for lymphoma or metastatic disease. Recommend clinical correlation. New bilateral airspace disease, left greater than right concerning for multifocal pneumonia. Small left pleural effusion. No evidence of pulmonary embolus. Coronary artery disease. Aortic Atherosclerosis (ICD10-I70.0). Electronically Signed   By: KRolm BaptiseM.D.   On: 03/23/2021 18:37   CT Angio Chest PE W and/or Wo Contrast  Result Date: 03/14/2021 CLINICAL DATA:  High probability for PE. Cough and shortness of breath. EXAM: CT ANGIOGRAPHY CHEST WITH CONTRAST TECHNIQUE: Multidetector CT imaging of the chest was performed using the standard protocol during bolus administration of intravenous contrast. Multiplanar CT image reconstructions and MIPs were obtained to evaluate the vascular anatomy. CONTRAST:  823mOMNIPAQUE IOHEXOL 350 MG/ML SOLN COMPARISON:  None. FINDINGS: Cardiovascular: Satisfactory opacification of the pulmonary arteries to the segmental level. No evidence of pulmonary embolism. Normal heart size. No pericardial effusion. Mediastinum/Nodes: There is an indeterminate 11 mm right thyroid nodule. There is an enlarged subcarinal lymph node measuring 13 mm short axis. There are enlarged bilateral hilar lymph nodes measuring up to 15 mm short axis. There also enlarged bilateral axillary lymph nodes. The largest lymph node on the left measures 1.8 by 3.7 cm in the largest lymph node on the right measures 1.3 by 2.0 cm. The esophagus is nondilated. Lungs/Pleura: Lungs are clear. No pleural effusion or pneumothorax. Upper  Abdomen: There is likely adenopathy in the periaortic region image 4/136 measuring 3.7 x 1.4 cm. Musculoskeletal: No chest wall abnormality. No acute or significant osseous findings. Review of the MIP images confirms the above findings. IMPRESSION: 1. No evidence for pulmonary embolism. 2. Mediastinal, hilar, bilateral axillary and upper abdominal lymphadenopathy of uncertain etiology. Findings may be related to lymphoma or metastatic disease. Recommend clinical correlation and follow-up. 3. 1.1 cm incidental right thyroid nodule. No follow-up imaging is recommended. Reference: J Am Coll Radiol. 2015 Feb;12(2): 143-50 Electronically Signed   By: Ronney Asters M.D.   On: 03/14/2021 23:46   CT ABDOMEN PELVIS W CONTRAST  Result Date: 03/25/2021 CLINICAL DATA:  Productive cough, increased shortness of breath for several weeks, lymphadenopathy EXAM: CT ABDOMEN AND PELVIS WITH CONTRAST TECHNIQUE: Multidetector CT imaging of the abdomen and pelvis was performed using the standard protocol following bolus administration of intravenous contrast. CONTRAST:  1147m OMNIPAQUE IOHEXOL 300 MG/ML  SOLN COMPARISON:  03/14/2021, 03/23/2021 FINDINGS: Lower chest: Bibasilar airspace disease and small left pleural effusion consistent with multifocal pneumonia. The heart is unremarkable. Hepatobiliary: No focal liver abnormality is seen. No gallstones, gallbladder wall thickening, or biliary dilatation. Pancreas: Unremarkable. No pancreatic ductal dilatation or surrounding inflammatory changes. Spleen: Normal in size without focal abnormality. Adrenals/Urinary Tract: Adrenal glands are unremarkable. Kidneys are normal, without renal calculi, focal lesion, or hydronephrosis. Bladder is unremarkable. Stomach/Bowel: No bowel obstruction or ileus. Normal appendix right lower quadrant. No bowel wall thickening or inflammatory change. Vascular/Lymphatic: Extensive mesenteric and retroperitoneal lymphadenopathy is identified. Index lymph  nodes are as follows: Left para-aortic, image 34/3, 15 x 25 mm. Left external iliac, image 67/3, 21 x 29 mm. Upper abdominal mesentery, image 36/3, 15 x 27 mm. Mild atherosclerosis.  No other significant vascular findings. Reproductive: Calcified uterine fibroid left aspect uterine fundus measuring 1.7 cm. No adnexal masses. Other: No free fluid or free gas.  No abdominal wall hernia. Musculoskeletal: No acute or destructive bony lesions. Reconstructed images demonstrate no additional findings. IMPRESSION: 1. Extensive lymphadenopathy throughout the abdomen and pelvis. Favor lymphoproliferative disorder over metastatic disease. 2. Bibasilar pneumonia and small left pleural effusion. Please refer to preceding chest CT evaluation. 3.  Aortic Atherosclerosis (ICD10-I70.0). Electronically Signed   By: MRanda NgoM.D.   On: 03/25/2021 15:29   [place] BI[place]SY (LYMPH NODES)  Result Date: 03/26/2021 INDICATION: Extensive lymphadenopathy in the abdomen and pelvis. No known primary. EXAM: ULTRASOUND GUIDED CORE BIOPSY OF RIGHT INGUINAL ADENOPATHY MEDICATIONS: Lidocaine 1% subcutaneous ANESTHESIA/SEDATION: Intravenous Fentanyl 236m and Versed 47m20mere administered as conscious sedation during continuous monitoring of the patient's level of consciousness and physiological / cardiorespiratory status by the radiology RN, with a total moderate sedation time of 14 minutes. PROCEDURE: The procedure, risks, benefits, and alternatives were explained to the patient. Questions regarding the procedure were encouraged and answered. The patient understands and consents to the procedure. Survey ultrasound of the right inguinal region performed. Representative adenopathy was localized and an appropriate skin entry site was determined and marked. The operative field was prepped with chlorhexidine in a sterile fashion, and a sterile drape was applied covering the operative field. A sterile gown and sterile gloves were used for the  procedure. Local anesthesia was provided with 1% Lidocaine. Under real-time ultrasound guidance, a 17 gauge trocar needle was advanced to the margin of the lesion. Once needle tip position was confirmed, coaxial 18-gauge core biopsy samples were obtained, submitted in saline to surgical pathology. The guide needle was removed. Postprocedure scans  show no hemorrhage or other apparent complication. COMPLICATIONS: None immediate. FINDINGS: Multiple enlarged right inguinal lymph nodes were identified corresponding to CT findings. Representative core biopsy samples obtained as above IMPRESSION: 1. Technically successful ultrasound-guided core biopsy, right inguinal adenopathy. Electronically Signed   By: Lucrezia Europe M.D.   On: 03/26/2021 16:23   VAS [place] LOWER EXTREMITY VENOUS (DVT)  Result Date: 03/25/2021  Lower Venous DVT Study Patient Name:  CHRISTASIA ANGELETTI  Date of Exam:   03/25/2021 Medical Rec #: [id number]       Accession #:    [id number] Date of Birth: 1951/05/25       Patient Gender: F Patient Age:   58 years Exam [facility]  [facility] Procedure:      VAS [place] LOWER EXTREMITY VENOUS (DVT) Referring Phys: PRANAV PATEL --------------------------------------------------------------------------------  Indications: Edema.  Comparison Study: no prior Performing Technologist: Archie Patten RVS  Examination Guidelines: A complete evaluation includes B-mode imaging, spectral Doppler, color Doppler, and power Doppler as needed of all accessible portions of each vessel. Bilateral testing is considered an integral part of a complete examination. Limited examinations for reoccurring indications may be performed as noted. The reflux portion of the exam is performed with the patient in reverse Trendelenburg.  +---------+---------------+---------+-----------+----------+--------------+ RIGHT    CompressibilityPhasicitySpontaneityPropertiesThrombus Aging  +---------+---------------+---------+-----------+----------+--------------+ CFV      Full           Yes      Yes                                 +---------+---------------+---------+-----------+----------+--------------+ SFJ      Full                                                        +---------+---------------+---------+-----------+----------+--------------+ FV Prox  Full                                                        +---------+---------------+---------+-----------+----------+--------------+ FV Mid   Full                                                        +---------+---------------+---------+-----------+----------+--------------+ FV DistalFull                                                        +---------+---------------+---------+-----------+----------+--------------+ PFV      Full                                                        +---------+---------------+---------+-----------+----------+--------------+ POP      Full  Yes      Yes                                 +---------+---------------+---------+-----------+----------+--------------+ PTV      Full                                                        +---------+---------------+---------+-----------+----------+--------------+ PERO     Full                                                        +---------+---------------+---------+-----------+----------+--------------+   +---------+---------------+---------+-----------+----------+--------------+ LEFT     CompressibilityPhasicitySpontaneityPropertiesThrombus Aging +---------+---------------+---------+-----------+----------+--------------+ CFV      Full           Yes      Yes                                 +---------+---------------+---------+-----------+----------+--------------+ SFJ      Full                                                         +---------+---------------+---------+-----------+----------+--------------+ FV Prox  Full                                                        +---------+---------------+---------+-----------+----------+--------------+ FV Mid   Full                                                        +---------+---------------+---------+-----------+----------+--------------+ FV DistalFull                                                        +---------+---------------+---------+-----------+----------+--------------+ PFV      Full                                                        +---------+---------------+---------+-----------+----------+--------------+ POP      Full           Yes      Yes                                 +---------+---------------+---------+-----------+----------+--------------+ PTV  Full                                                        +---------+---------------+---------+-----------+----------+--------------+ PERO     Full                                                        +---------+---------------+---------+-----------+----------+--------------+     Summary: BILATERAL: - No evidence of deep vein thrombosis seen in the lower extremities, bilaterally. -No evidence of popliteal cyst, bilaterally.   *See table(s) above for measurements and observations. Electronically signed by Harold Barban MD on 03/25/2021 at 9:25:17 PM.    Final     ASSESSMENT & PLAN:   70 year old female with history of seizure disorder with  1) Newly diagnosed at least stage III follicular lymphoma  CTA chest showed Mediastinal, hilar, bilateral axillary and upper abdominal lymphadenopathy. This is in the context of an acute respiratory illness. Borderline LDH elevation could be from lung inflammation or lymphoproliferative process.  2) bilateral lung infiltrates concerning for multifocal pneumonia.-Resolved  Lymphadenopathy could be reactive in the setting  of a viral infection with possible superadded bacterial pneumonia.  COVID-19 testing was negative.  Acute EBV infection or CMV infection would also be other possibilities . Extended viral respiratory panel has been ordered and is currently pending.  Blood cultures pending.  Other septic work-up is in the process.  Procalcitonin level is not elevated.  However based on the size and extent of lymphadenopathy it would be appropriate to rule out the possibility of a lymphoproliferative disorder or other metastatic malignancy.  Cannot rule out the possibility of other inflammatory conditions such as sarcoidosis which could present as a lymphoma on the neck.  3) history of seizure disorder on Lamictal  4) normocytic anemia could be from lymphoma plus her recent pneumonia hemoglobin is improved to 10.3 from 8.5. PLAN -Her lymph node biopsy results showing likely follicular lymphoma were discussed in details. -t(14;18) and Ki-67 results are pending. -We discussed that she has likely stage III follicular lymphoma and with abnormal blood counts could have bone marrow involvement.  Stage IV. -We discussed that based on the pathology sample the pathologist has not been able to accurately evaluate her follicular lymphoma but the Ki-67 might help.  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 -We will get a PET CT scan to more accurately stage her newly diagnosed non-Hodgkin's lymphoma and also to assess degree of activity of her lymph nodes and if needed evaluate for other additional biopsies if the imaging is discordant with her pathology findings. -CT-guided bone marrow aspiration and biopsy to evaluate for lymphoma involvement. -We discussed the diagnosis, natural history, prognosis, treatment criteria for treatment of follicular lymphoma.  All of the patient's and her daughters questions were answered in details to  their apparent satisfaction. Follow-up Labs today PET/CT in 2 weeks CT bone marrow biopsy in 2 weeks Return to clinic with Dr. Irene Limbo in 3 weeks    All of the patients questions were answered with apparent satisfaction. The patient knows to call the clinic with any  problems, questions or concerns.  . The total time spent in the appointment was 40 minutes and more than 50% was on counseling and direct patient cares.     Sullivan Lone MD MS AAHIVMS [facility] Hematology/Oncology Physician [facility]    .

## 2021-04-16 ENCOUNTER — Encounter: Payer: BC Managed Care – PPO | Admitting: Gastroenterology

## 2021-04-20 ENCOUNTER — Ambulatory Visit (HOSPITAL_COMMUNITY)
Admission: RE | Admit: 2021-04-20 | Discharge: 2021-04-20 | Disposition: A | Discharge status: Home / Self Care | Payer: BC Managed Care – PPO | Source: Ambulatory Visit | Attending: Hematology | Admitting: Hematology

## 2021-04-20 ENCOUNTER — Other Ambulatory Visit: Payer: Self-pay

## 2021-04-20 DIAGNOSIS — I2510 Atherosclerotic heart disease of native coronary artery without angina pectoris: Secondary | ICD-10-CM | POA: Insufficient documentation

## 2021-04-20 DIAGNOSIS — I700 Atherosclerosis of aorta: Secondary | ICD-10-CM | POA: Insufficient documentation

## 2021-04-20 DIAGNOSIS — C8290 Follicular lymphoma, unspecified, unspecified site: Secondary | ICD-10-CM | POA: Diagnosis not present

## 2021-04-20 DIAGNOSIS — C8298 Follicular lymphoma, unspecified, lymph nodes of multiple sites: Secondary | ICD-10-CM | POA: Insufficient documentation

## 2021-04-20 DIAGNOSIS — D259 Leiomyoma of uterus, unspecified: Secondary | ICD-10-CM | POA: Diagnosis not present

## 2021-04-20 DIAGNOSIS — N3289 Other specified disorders of bladder: Secondary | ICD-10-CM | POA: Diagnosis not present

## 2021-04-20 LAB — GLUCOSE, CAPILLARY: Glucose-Capillary: 88 mg/dL (ref 70–99)

## 2021-04-20 MED ORDER — FLUDEOXYGLUCOSE F - 18 (FDG) INJECTION
7.7000 | Freq: Once | INTRAVENOUS | Status: AC | PRN
  Administered 2021-04-20: 8.25 via INTRAVENOUS

## 2021-04-21 ENCOUNTER — Ambulatory Visit: Payer: BC Managed Care – PPO | Admitting: Hematology

## 2021-04-28 ENCOUNTER — Telehealth: Payer: Self-pay

## 2021-04-28 NOTE — Telephone Encounter (Signed)
Patient notified of completion of FMLA paperwork. Fax Transmission Confirmation received and copy placed for pick up t Registration desk as requested.

## 2021-04-30 DIAGNOSIS — S52571A Other intraarticular fracture of lower end of right radius, initial encounter for closed fracture: Secondary | ICD-10-CM | POA: Diagnosis not present

## 2021-04-30 DIAGNOSIS — S52571D Other intraarticular fracture of lower end of right radius, subsequent encounter for closed fracture with routine healing: Secondary | ICD-10-CM | POA: Diagnosis not present

## 2021-05-06 ENCOUNTER — Other Ambulatory Visit: Payer: Self-pay | Admitting: Radiology

## 2021-05-08 NOTE — H&P (Signed)
Chief Complaint: Patient was seen in consultation today for bone marrow biopsy Katie Mcneil  Referring Physician(s): Katie Mcneil  Supervising Physician: Ruthann Cancer  Patient Status: [facility] - Out-pt  History of Present Illness: Katie Mcneil is a 70 y.o. female with PMH of seizures and dyslipidemia. Pt presented to ED 03/14/21 c/o cough, congestion and SOB. She was found to have elevated D-dimer with no evidence of PE and mediastinal, hilar, bilateral axillary, and upper abdominal lymphadenopathy of unknown etiology. Pt saw PCP and was started on abx treatment for CAP. Pt underwent lymph node biopsy 03/26/21 which was positive for Non-Hodgkin B-cell lymphoma. Dr. Sullivan Lone has referred pt to IR for bone marrow biopsy to evaluate for lymphoma involvement.   PET scan 04/21/21: IMPRESSION: 1. Hypermetabolic adenopathy throughout the neck, chest, abdomen and pelvis, compatible with the given history of follicular lymphoma. 2. Near complete resolution of previously seen peribronchovascular ground-glass, nodularity and consolidation on 03/23/2021, indicative of resolved pneumonia. 3. Bladder wall thickening. 4. Aortic atherosclerosis (ICD10-I70.0). Coronary artery calcification.  Past Medical History:  Diagnosis Date   Dyslipidemia    Seizures ([place])     Past Surgical History:  Procedure Laterality Date   CATARACT EXTRACTION W/ INTRAOCULAR LENS IMPLANT Bilateral 2019   CESAREAN SECTION  1981, 1990   NO PAST SURGERIES     TUBAL LIGATION      Allergies: Patient has no known allergies.  Medications: Prior to Admission medications   Medication Sig Start Date End Date Taking? Authorizing Provider  acetaminophen (TYLENOL) 500 MG tablet Take 1 tablet (500 mg total) by mouth every 8 (eight) hours as needed for moderate pain or fever. 03/28/21 03/28/22  Thurnell Lose, MD  albuterol (VENTOLIN HFA) 108 (90 Base) MCG/ACT inhaler PLEASE SEE ATTACHED FOR DETAILED  DIRECTIONS 03/14/21   [provider]  LamoTRIgine 300 MG TB24 24 hour tablet Take 1 tablet (300 mg total) by mouth daily. 01/14/21   Ward Givens, NP  Omega-3 Fatty Acids (FISH OIL) 1000 MG CAPS Take 1 capsule by mouth daily in the afternoon. Patient not taking: Reported on 04/01/2021    [provider]  Vitamin D, Cholecalciferol, 25 MCG (1000 UT) CAPS Take 1 capsule by mouth daily in the afternoon. Patient not taking: Reported on 04/01/2021    [provider]     Family History  Problem Relation Age of Onset   Neuropathy Mother    Anuerysm Father    Leukemia Sister 64   Alzheimer's disease Sister 41   Dementia Brother    Heart attack Maternal Grandmother    CVA Maternal Grandfather    Dementia Paternal Grandmother    Seizures Daughter    Other Daughter        Psychological disorder   Colon cancer Neg Hx     Social History   Socioeconomic History   Marital status: Widowed    Spouse name: Not on file   Number of children: 2   Years of education: Not on file   Highest education level: Not on file  Occupational History   Not on file  Tobacco Use   Smoking status: Never   Smokeless tobacco: Never  Vaping Use   Vaping Use: Never used  Substance and Sexual Activity   Alcohol use: Not Currently    Alcohol/week: 0.0 standard drinks   Drug use: No   Sexual activity: Not Currently  Other Topics Concern   Not on file  Social History Narrative   Works in Engineer, mining at  [employer]   Lives with daughter (who has mental health issues)   Caregiver for mother   Has two children   Widowed   Social Determinants of Health   Financial Resource Strain: Not on file  Food Insecurity: Not on file  Transportation Needs: Not on file  Physical Activity: Not on file  Stress: Not on file  Social Connections: Not on file    Review of Systems: A 12 point ROS discussed and pertinent positives are indicated in the HPI above.  All other systems are  negative.  Review of Systems  Constitutional:  Negative for chills and fever.  HENT:  Negative for nosebleeds.   Respiratory:  Negative for cough and shortness of breath.   Cardiovascular:  Negative for chest pain and leg swelling.  Gastrointestinal:  Negative for abdominal pain, blood in stool, nausea and vomiting.  Genitourinary:  Negative for hematuria.   Vital Signs: BP 128/76   Pulse 77   Temp 98.3 F (36.8 C) (Oral)   Resp 18   Ht 5' 6" (1.676 m)   Wt 160 lb (72.6 kg)   SpO2 98%   BMI 25.82 kg/m   Physical Exam Constitutional:      Appearance: Normal appearance. She is not ill-appearing.  HENT:     Head: Normocephalic and atraumatic.     Mouth/Throat:     Mouth: Mucous membranes are moist.     Pharynx: Oropharynx is clear.  Cardiovascular:     Rate and Rhythm: Normal rate and regular rhythm.     Pulses: Normal pulses.     Heart sounds: Normal heart sounds. No murmur heard.   No friction rub. No gallop.  Pulmonary:     Effort: Pulmonary effort is normal. No respiratory distress.     Breath sounds: Normal breath sounds. No stridor. No wheezing, rhonchi or rales.  Abdominal:     General: Bowel sounds are normal. There is no distension.     Palpations: Abdomen is soft.     Tenderness: There is no abdominal tenderness. There is no guarding.  Musculoskeletal:     Right lower leg: No edema.     Left lower leg: No edema.  Skin:    General: Skin is warm and dry.  Neurological:     Mental Status: She is alert and oriented to person, place, and time.  Psychiatric:        Mood and Affect: Mood normal.        Behavior: Behavior normal.        Thought Content: Thought content normal.        Judgment: Judgment normal.    Imaging: NM PET Image Initial (PI) Skull Base To Thigh  Result Date: 04/21/2021 CLINICAL DATA:  Initial treatment strategy for follicular lymphoma. EXAM: NUCLEAR MEDICINE PET SKULL BASE TO THIGH TECHNIQUE: 8.3 mCi F-18 FDG was injected intravenously.  Full-ring PET imaging was performed from the skull base to thigh after the radiotracer. CT data was obtained and used for attenuation correction and anatomic localization. Fasting blood glucose: 88 mg/dl COMPARISON:  CT abdomen pelvis 03/25/2021 and CT chest 03/23/2021. FINDINGS: Mediastinal blood pool activity: SUV max 2.4 Liver activity: SUV max 3.1 NECK: Hypermetabolic lymph nodes are seen throughout the neck. Index left level 2 lymph node measures 11 mm (4/25), SUV max 5.6. Incidental CT findings: None. CHEST: Hypermetabolic lymph nodes in the supraclavicular, low internal jugular, subpectoral, axillary, mediastinal and hilar nodal stations. Index left axillary lymph node measures 1.5 cm (4/45),  SUV max 5.7. Index low right paratracheal lymph node measures 11 mm (4/53), SUV max 5.2. No hypermetabolic pulmonary nodules. Incidental CT findings: Atherosclerotic calcification of the aorta and coronary arteries. Heart is at the upper limits of normal in size. No pericardial or pleural effusion. Near complete resolution of previously seen peribronchovascular ground-glass, nodularity and consolidation on 03/23/2021. ABDOMEN/PELVIS: Hypermetabolic lymph nodes the periportal regions, abdominopelvic retroperitoneum, small bowel mesentery and inguinal regions. Index periportal lymph node measures 2.4 cm (4/94) with an SUV max 5.7. Index retroperitoneal lymph node is seen in the proximal right common iliac station, measuring 1.2 cm (4/124), SUV max 6.9. Index small bowel mesenteric lymph node is seen in the left paramidline mesentery, measuring 1.3 cm (4/115), SUV max 4.5. Finally, index left inguinal lymph node measures 11 mm (4/175), SUV max 4.2. No abnormal hypermetabolism in the liver, adrenal glands, spleen or pancreas. Incidental CT findings: Subcentimeter low-attenuation lesion in the peripheral dome of the liver, too small to characterize. Liver, gallbladder, adrenal glands, kidneys, spleen, pancreas, stomach and  bowel are otherwise grossly unremarkable. Atherosclerotic calcification of the aorta. Calcified uterine fibroid. Bladder wall thickening. SKELETON: No abnormal hypermetabolism. Incidental CT findings: Degenerative changes in the spine. IMPRESSION: 1. Hypermetabolic adenopathy throughout the neck, chest, abdomen and pelvis, compatible with the given history of follicular lymphoma. 2. Near complete resolution of previously seen peribronchovascular ground-glass, nodularity and consolidation on 03/23/2021, indicative of resolved pneumonia. 3. Bladder wall thickening. 4. Aortic atherosclerosis (ICD10-I70.0). Coronary artery calcification. Electronically Signed   By: Lorin Picket M.D.   On: 04/21/2021 13:53    Labs:  CBC: Recent Labs    03/27/21 0152 03/28/21 0109 04/02/21 1303 04/02/21 1304 05/11/21 0719  WBC 13.8* 11.8* 7.4  --  5.9  HGB 8.2* 8.4* 10.3*  --  11.5*  HCT 26.4* 26.8* 33.2* 31.5* 37.5  PLT 428* 453* 594*  --  230    COAGS: Recent Labs    03/23/21 1530  INR 1.1  APTT 37*    BMP: Recent Labs    03/26/21 0134 03/27/21 0152 03/28/21 0109 04/02/21 1303  NA 135 136 137 138  K 3.6 3.7 3.7 3.9  CL 104 105 105 106  CO2 _0 GLUCOSE 112* 105* 107* 96  BUN 9 <5* 7* 9  CALCIUM 8.5* 8.6* 8.5* 9.3  CREATININE 0.66 0.58 0.55 0.67  GFRNONAA >60 >60 >60 >60    LIVER FUNCTION TESTS: Recent Labs    03/26/21 0134 03/27/21 0152 03/28/21 0109 04/02/21 1303  BILITOT 0.7 0.3 0.2* <0.2*  AST 39 34 51* 21  ALT 37 37 42 33  ALKPHOS 126 113 110 104  PROT 6.2* 6.0* 5.9* 7.9  ALBUMIN 2.7* 2.6* 2.5* 3.7    TUMOR MARKERS: No results for input(s): AFPTM, CEA, CA199, CHROMGRNA in the last 8760 hours.  Assessment and Plan: History of seizures and dyslipidemia. Pt presented to ED 03/14/21 c/o cough, congestion and SOB. She was found to have elevated D-dimer with no evidence of PE and mediastinal, hilar, bilateral axillary, and upper abdominal lymphadenopathy of unknown  etiology. Pt saw PCP and was started on abx treatment for CAP. Pt underwent lymph node biopsy 03/26/21 which was positive for Non-Hodgkin B-cell lymphoma. Dr. Sullivan Lone has referred pt to IR for bone marrow biopsy to evaluate for lymphoma involvement.   PET scan 04/21/21: IMPRESSION: 1. Hypermetabolic adenopathy throughout the neck, chest, abdomen and pelvis, compatible with the given history of follicular lymphoma. 2. Near complete resolution of previously seen  peribronchovascular ground-glass, nodularity and consolidation on 03/23/2021, indicative of resolved pneumonia. 3. Bladder wall thickening. 4. Aortic atherosclerosis (ICD10-I70.0). Coronary artery calcification.  Pt resting on stretcher. She is A&O, calm and pleasant. She is in no distress.  Pt states she is NPO per order Today's labs pending VSS  Risks and benefits of bone marrow biopsy was discussed with the patient and/or patient's family including, but not limited to bleeding, infection, damage to adjacent structures or low yield requiring additional tests.  All of the questions were answered and there is agreement to proceed.  Consent signed and in chart.   Thank you for this interesting consult.  I greatly enjoyed meeting Katie Mcneil and look forward to participating in their care.  A copy of this report was sent to the requesting provider on this date.  Electronically Signed: Tyson Alias, NP 05/11/2021, 8:43 AM   I spent a total of 30 minutes in face to face in clinical consultation, greater than 50% of which was counseling/coordinating care for bone marrow biopsy.

## 2021-05-11 ENCOUNTER — Ambulatory Visit (HOSPITAL_COMMUNITY)
Admission: RE | Admit: 2021-05-11 | Discharge: 2021-05-11 | Disposition: A | Discharge status: Home / Self Care | Payer: BC Managed Care – PPO | Source: Ambulatory Visit | Attending: Hematology | Admitting: Hematology

## 2021-05-11 ENCOUNTER — Encounter (HOSPITAL_COMMUNITY): Payer: Self-pay

## 2021-05-11 ENCOUNTER — Other Ambulatory Visit: Payer: Self-pay

## 2021-05-11 DIAGNOSIS — Q973 Female with 46, XY karyotype: Secondary | ICD-10-CM | POA: Diagnosis not present

## 2021-05-11 DIAGNOSIS — C8218 Follicular lymphoma grade II, lymph nodes of multiple sites: Secondary | ICD-10-CM | POA: Diagnosis not present

## 2021-05-11 DIAGNOSIS — E785 Hyperlipidemia, unspecified: Secondary | ICD-10-CM | POA: Diagnosis not present

## 2021-05-11 DIAGNOSIS — C8298 Follicular lymphoma, unspecified, lymph nodes of multiple sites: Secondary | ICD-10-CM | POA: Diagnosis not present

## 2021-05-11 DIAGNOSIS — D649 Anemia, unspecified: Secondary | ICD-10-CM | POA: Diagnosis not present

## 2021-05-11 DIAGNOSIS — R569 Unspecified convulsions: Secondary | ICD-10-CM | POA: Insufficient documentation

## 2021-05-11 DIAGNOSIS — C8590 Non-Hodgkin lymphoma, unspecified, unspecified site: Secondary | ICD-10-CM | POA: Diagnosis not present

## 2021-05-11 DIAGNOSIS — C8290 Follicular lymphoma, unspecified, unspecified site: Secondary | ICD-10-CM | POA: Diagnosis not present

## 2021-05-11 DIAGNOSIS — I700 Atherosclerosis of aorta: Secondary | ICD-10-CM | POA: Diagnosis not present

## 2021-05-11 LAB — CBC WITH DIFFERENTIAL/PLATELET
Abs Immature Granulocytes: 0.03 10*3/uL (ref 0.00–0.07)
Basophils Absolute: 0.1 10*3/uL (ref 0.0–0.1)
Basophils Relative: 1 %
Eosinophils Absolute: 0.2 10*3/uL (ref 0.0–0.5)
Eosinophils Relative: 4 %
HCT: 37.5 % (ref 36.0–46.0)
Hemoglobin: 11.5 g/dL — ABNORMAL LOW (ref 12.0–15.0)
Immature Granulocytes: 1 %
Lymphocytes Relative: 48 %
Lymphs Abs: 2.9 10*3/uL (ref 0.7–4.0)
MCH: 28 pg (ref 26.0–34.0)
MCHC: 30.7 g/dL (ref 30.0–36.0)
MCV: 91.2 fL (ref 80.0–100.0)
Monocytes Absolute: 0.7 10*3/uL (ref 0.1–1.0)
Monocytes Relative: 12 %
Neutro Abs: 2 10*3/uL (ref 1.7–7.7)
Neutrophils Relative %: 34 %
Platelets: 230 10*3/uL (ref 150–400)
RBC: 4.11 MIL/uL (ref 3.87–5.11)
RDW: 14.2 % (ref 11.5–15.5)
WBC: 5.9 10*3/uL (ref 4.0–10.5)
nRBC: 0 % (ref 0.0–0.2)

## 2021-05-11 MED ORDER — FENTANYL CITRATE (PF) 100 MCG/2ML IJ SOLN
INTRAMUSCULAR | Status: AC
  Filled 2021-05-11: qty 2

## 2021-05-11 MED ORDER — FENTANYL CITRATE (PF) 100 MCG/2ML IJ SOLN
INTRAMUSCULAR | Status: AC | PRN
  Administered 2021-05-11: 50 ug via INTRAVENOUS

## 2021-05-11 MED ORDER — MIDAZOLAM HCL 2 MG/2ML IJ SOLN
INTRAMUSCULAR | Status: AC | PRN
  Administered 2021-05-11 (×2): 1 mg via INTRAVENOUS

## 2021-05-11 MED ORDER — NALOXONE HCL 0.4 MG/ML IJ SOLN
INTRAMUSCULAR | Status: AC
  Filled 2021-05-11: qty 1

## 2021-05-11 MED ORDER — FLUMAZENIL 0.5 MG/5ML IV SOLN
INTRAVENOUS | Status: AC
  Filled 2021-05-11: qty 5

## 2021-05-11 MED ORDER — LIDOCAINE HCL (PF) 1 % IJ SOLN
INTRAMUSCULAR | Status: AC | PRN
  Administered 2021-05-11: 10 mL via INTRADERMAL

## 2021-05-11 MED ORDER — SODIUM CHLORIDE 0.9 % IV SOLN: INTRAVENOUS | Status: DC

## 2021-05-11 MED ORDER — MIDAZOLAM HCL 2 MG/2ML IJ SOLN
INTRAMUSCULAR | Status: AC
  Filled 2021-05-11: qty 4

## 2021-05-11 NOTE — Discharge Instructions (Signed)

## 2021-05-11 NOTE — Procedures (Signed)
Interventional Radiology Procedure Note  Procedure: CT guided aspirate and core biopsy of right iliac bone  Complications: None  Recommendations: - Bedrest supine x 1 hrs - Hydrocodone PRN  Pain - Follow biopsy results   Beckey Polkowski, MD   

## 2021-05-12 ENCOUNTER — Inpatient Hospital Stay: Payer: BC Managed Care – PPO | Attending: Hematology | Admitting: Hematology

## 2021-05-12 VITALS — BP 141/81 | HR 73 | Temp 98.2°F | Resp 18 | Wt 157.0 lb

## 2021-05-12 DIAGNOSIS — D649 Anemia, unspecified: Secondary | ICD-10-CM | POA: Insufficient documentation

## 2021-05-12 DIAGNOSIS — G40909 Epilepsy, unspecified, not intractable, without status epilepticus: Secondary | ICD-10-CM | POA: Diagnosis not present

## 2021-05-12 DIAGNOSIS — C8298 Follicular lymphoma, unspecified, lymph nodes of multiple sites: Secondary | ICD-10-CM | POA: Diagnosis not present

## 2021-05-12 DIAGNOSIS — Z7189 Other specified counseling: Secondary | ICD-10-CM

## 2021-05-12 DIAGNOSIS — C8228 Follicular lymphoma grade III, unspecified, lymph nodes of multiple sites: Secondary | ICD-10-CM | POA: Insufficient documentation

## 2021-05-12 DIAGNOSIS — Z79899 Other long term (current) drug therapy: Secondary | ICD-10-CM | POA: Insufficient documentation

## 2021-05-13 LAB — SURGICAL PATHOLOGY

## 2021-05-18 ENCOUNTER — Encounter (HOSPITAL_COMMUNITY): Payer: Self-pay

## 2021-05-18 LAB — SURGICAL PATHOLOGY

## 2021-05-18 NOTE — Progress Notes (Addendum)
[name]   HEMATOLOGY/ONCOLOGY CLINIC NOTE  Date of Service: .05/12/2021   Patient Care Team: [name], [place] as PCP - General (Physician Assistant)  CHIEF COMPLAINTS/PURPOSE OF CONSULTATION:  Posthospitalization follow-up for recently diagnosed follicular lymphoma  HISTORY OF PRESENTING ILLNESS:  Please see previous note for details INTERVAL HISTORY  Patient was seen in follow-up for her recently diagnosed follicular lymphoma.  She notes that she has been feeling well with improved energy levels since recovering from her recent pneumonia. No fevers no chills no night sweats no unexpected weight loss. Notes no significant change in her neck lymph nodes.  PET CT scan was done on 04/20/2021 was reviewed with the patient.  Shows hypermetabolic lymphadenopathy throughout the neck chest abdomen and pelvis compatible with her history of follicular lymphoma. Near complete resolution of her previous pneumonia.  Patient had a bone marrow aspiration and biopsy on 05/11/2021 which showed hypercellular bone marrow 70% cellularity involved with follicular lymphoma which comprises 50% of all lymphocytes.  Noted to have t(14;18) consistent with follicular lymphoma.  Patient CBC done on 05/11/2021 showed improvement in hemoglobin to 11.5, WBC count of 5.9k and platelets of 230k.    MEDICAL HISTORY:  Past Medical History:  Diagnosis Date   Dyslipidemia    Seizures ([place])     SURGICAL HISTORY: Past Surgical History:  Procedure Laterality Date   CATARACT EXTRACTION W/ INTRAOCULAR LENS IMPLANT Bilateral 2019   CESAREAN SECTION  1981, 1990   NO PAST SURGERIES     TUBAL LIGATION      SOCIAL HISTORY: Social History   Socioeconomic History   Marital status: Widowed    Spouse name: Not on file   Number of children: 2   Years of education: Not on file   Highest education level: Not on file  Occupational History   Not on file  Tobacco Use   Smoking status: Never   Smokeless tobacco:  Never  Vaping Use   Vaping Use: Never used  Substance and Sexual Activity   Alcohol use: Not Currently    Alcohol/week: 0.0 standard drinks   Drug use: No   Sexual activity: Not Currently  Other Topics Concern   Not on file  Social History Narrative   Works in Engineer, 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[employer], contracting   Lives with daughter (who has mental health issues)   Caregiver for mother   Has two children   Widowed   Social Determinants of Radio broadcast assistant Strain: Not on file  Food Insecurity: Not on file  Transportation Needs: Not on file  Physical Activity: Not on file  Stress: Not on file  Social Connections: Not on file  Intimate Partner Violence: Not on file    FAMILY HISTORY: Family History  Problem Relation Age of Onset   Neuropathy Mother    Anuerysm Father    Leukemia Sister 81   Alzheimer's disease Sister 34   Dementia Brother    Heart attack Maternal Grandmother    CVA Maternal Grandfather    Dementia Paternal Grandmother    Seizures Daughter    Other Daughter        Psychological disorder   Colon cancer Neg Hx     ALLERGIES:  has No Known Allergies.  MEDICATIONS:  Current Outpatient Medications  Medication Sig Dispense Refill   acetaminophen (TYLENOL) 500 MG tablet Take 1 tablet (500 mg total) by mouth every 8 (eight) hours as needed for moderate pain or fever. 25 tablet 0   albuterol (VENTOLIN HFA) 108 (  90 Base) MCG/ACT inhaler PLEASE SEE ATTACHED FOR DETAILED DIRECTIONS     cetirizine (ZYRTEC) 10 MG tablet Take 10 mg by mouth daily.     LamoTRIgine 300 MG TB24 24 hour tablet Take 1 tablet (300 mg total) by mouth daily. 90 tablet 3   Omega-3 Fatty Acids (FISH OIL) 1000 MG CAPS Take 1 capsule by mouth daily in the afternoon. (Patient not taking: Reported on 04/01/2021)     Vitamin D, Cholecalciferol, 25 MCG (1000 UT) CAPS Take 1 capsule by mouth daily in the afternoon. (Patient not taking: Reported on 04/01/2021)     No current facility-administered medications  for this visit.    REVIEW OF SYSTEMS:   .10 Point review of Systems was done is negative except as noted above.   PHYSICAL EXAMINATION: ECOG PERFORMANCE STATUS: 1 - Symptomatic but completely ambulatory  . Vitals:   05/12/21 1447  BP: (!) 141/81  Pulse: 73  Resp: 18  Temp: 98.2 F (36.8 C)  SpO2: 100%   Filed Weights   05/12/21 1447  Weight: 157 lb (71.2 kg)   .Body mass index is 25.34 kg/m.  . . GENERAL:alert, in no acute distress and comfortable SKIN: no acute rashes, no significant lesions EYES: conjunctiva are pink and non-injected, sclera anicteric OROPHARYNX: MMM, no exudates, no oropharyngeal erythema or ulceration NECK: supple, no JVD LYMPH: No palpable cervical lymphadenopathy LUNGS: clear to auscultation b/l with normal respiratory effort HEART: regular rate & rhythm ABDOMEN:  normoactive bowel sounds , non tender, not distended. Extremity: no pedal edema PSYCH: alert & oriented x 3 with fluent speech NEURO: no focal motor/sensory deficits   LABORATORY DATA:  I have reviewed the data as listed  . CBC Latest Ref Rng & Units 05/11/2021 04/02/2021 04/02/2021  WBC 4.0 - 10.5 K/uL 5.9 7.4 -  Hemoglobin 12.0 - 15.0 g/dL 11.5(L) 10.3(L) -  Hematocrit 36.0 - 46.0 % 37.5 31.5(L) 33.2(L)  Platelets 150 - 400 K/uL 230 594(H) -   .CBC    Component Value Date/Time   WBC 5.9 05/11/2021 0719   RBC 4.11 05/11/2021 0719   HGB 11.5 (L) 05/11/2021 0719   HGB 11.3 12/14/2018 1017   HCT 37.5 05/11/2021 0719   HCT 31.5 (L) 04/02/2021 1304   PLT 230 05/11/2021 0719   PLT 289 12/14/2018 1017   MCV 91.2 05/11/2021 0719   MCV 87 12/14/2018 1017   MCH 28.0 05/11/2021 0719   MCHC 30.7 05/11/2021 0719   RDW 14.2 05/11/2021 0719   RDW 13.6 12/14/2018 1017   LYMPHSABS 2.9 05/11/2021 0719   LYMPHSABS 1.3 12/14/2018 1017   MONOABS 0.7 05/11/2021 0719   EOSABS 0.2 05/11/2021 0719   EOSABS 0.3 12/14/2018 1017   BASOSABS 0.1 05/11/2021 0719   BASOSABS 0.0 12/14/2018  1017    CMP Latest Ref Rng & Units 04/02/2021 03/28/2021 03/27/2021  Glucose 70 - 99 mg/dL 96 107(H) 105(H)  BUN 8 - 23 mg/dL 9 7(L) <5(L)  Creatinine 0.44 - 1.00 mg/dL 0.67 0.55 0.58  Sodium 135 - 145 mmol/L 138 137 136  Potassium 3.5 - 5.1 mmol/L 3.9 3.7 3.7  Chloride 98 - 111 mmol/L 106 105 105  CO2 22 - 32 mmol/L '25 25 24  ' Calcium 8.9 - 10.3 mg/dL 9.3 8.5(L) 8.6(L)  Total Protein 6.5 - 8.1 g/dL 7.9 5.9(L) 6.0(L)  Total Bilirubin 0.3 - 1.2 mg/dL <0.2(L) 0.2(L) 0.3  Alkaline Phos 38 - 126 U/L 104 110 113  AST 15 - 41 U/L 21 51(H) 34  ALT 0 - 44 U/L 33 42 37   . Lab Results  Component Value Date   LDH 181 04/02/2021   Component     Latest Ref Rng & Units 04/02/2021  Iron     28 - 170 ug/dL 48  TIBC     250 - 450 ug/dL 234 (L)  Saturation Ratios     10.4 - 31.8 % 20  UIBC     ug/dL 186  Folate, Hemolysate     Not Estab. ng/mL 287.0  HCT     34.0 - 46.6 % 31.5 (L)  Folate, RBC     >498 ng/mL 911  DAT, complement      NEG  DAT, IgG      NEG . . .  Hep B Core Total Ab     NON REACTIVE NON REACTIVE  Hepatitis B Surface Ag     NON REACTIVE NON REACTIVE  HCV Ab     NON REACTIVE NON REACTIVE  Haptoglobin     37 - 355 mg/dL 174  Ferritin     11 - 307 ng/mL 131  Vitamin B12     180 - 914 pg/mL 789  LDH     98 - 192 U/L 181    SURGICAL PATHOLOGY  CASE: [id number]  PATIENT: [place]  Surgical Pathology Report      Clinical History: diffuse LAN, no primary, R/O lymphoma (cm)    FINAL MICROSCOPIC DIAGNOSIS:   A. LYMPH NODE, RIGHT INGUINAL, NEEDLE CORE BIOPSY:  -  Non-Hodgkin B-cell lymphoma  -  See comment   COMMENT:   The biopsy consists of three small lymph node cores composed of a  monotonous proliferation of small to medium sized lymphocytes without  well-formed, back to back follicles.  By immunohistochemistry there is a  slight predominance of B cells by CD20 and CD3 immunohistochemistry.  The B cells appear to co-express CD79a,  CD10, BCL6 and Bcl-2 both within  follicles and within the interfollicular spaces. CD23 shows expanded  dendritic meshworks. The B-cells are negative for CD5 and cyclin D1.  Flow cytometry identified a kappa-restricted monoclonal B-cell  population expressing CD10 (42% of all lymphocytes; See [id number]).  Overall, the findings are consistent with a non-Hodgkin B-cell lymphoma,  most likely follicular lymphoma.  Harris for O(75;64) is pending and will  be reported in an addendum along with ki-67.  Unfortunately, given the  limited and fragmented nature of the sample accurate grading of the  lymphoma is not possible on the sample provided.   SURGICAL PATHOLOGY Surgical Pathology   THIS IS AN ADDENDUM REPORT   CASE: [id number]  PATIENT: Yilia Bittick  Bone Marrow Report  Addendum    Reason for Addendum #1:  Molecular Add-on   Clinical History: Follicular lymphoma of lymphoma nodes of multiple  regions, unspecified grade ([place])  ,( [facility])    DIAGNOSIS:   BONE MARROW, ASPIRATE, CLOT, CORE:  -  Hypercellular marrow involved by follicular lymphoma  -  See microscopic description and comment below   PERIPHERAL BLOOD:  -  Normocytic anemia  -  See complete blood cell count   COMMENT:   The combined morphologic and flow cytometric features are consistent  with bone marrow involvement by the patient's previously diagnosed  follicular lymphoma.  Correlation with cytogenetics is recommended.    ADDENDUM:   ** Please note this testing was performed and interpreted by an outside  facility ([facility]).  This addendum is only being added to provide  a  summary of the results for report completeness.  Please see electronic  medical record for a copy of the full report. **   Karyotype:  46,X,t(X;18)(q13;q11.2),+der(X)t(X;18),add(6)(q12),-11,t(12;13)(q24.1;q1  2),t(14;18)(q32;q21)[2]/46,XX[18]   Interpretation:  ABNORMAL FEMALE KARYOTYPE   The abnormalities include loss of material  from the long arm of  chromosome 6 and the t(14;18)(q32;q21) - both recurring abnormalities in  lymphomas. The t(14;18)(q32;q21.3), involving the IGH gene at 14q32.3  and the BCL2 gene at 18q21.3, is observed in 95% of follicular  lymphomas.    RADIOGRAPHIC STUDIES: I have personally reviewed the radiological images as listed and agreed with the findings in the report. NM PET Image Initial (PI) Skull Base To Thigh  Result Date: 04/21/2021 CLINICAL DATA:  Initial treatment strategy for follicular lymphoma. EXAM: NUCLEAR MEDICINE PET SKULL BASE TO THIGH TECHNIQUE: 8.3 mCi F-18 FDG was injected intravenously. Full-ring PET imaging was performed from the skull base to thigh after the radiotracer. CT data was obtained and used for attenuation correction and anatomic localization. Fasting blood glucose: 88 mg/dl COMPARISON:  CT abdomen pelvis 03/25/2021 and CT chest 03/23/2021. FINDINGS: Mediastinal blood pool activity: SUV max 2.4 Liver activity: SUV max 3.1 NECK: Hypermetabolic lymph nodes are seen throughout the neck. Index left level 2 lymph node measures 11 mm (4/25), SUV max 5.6. Incidental CT findings: None. CHEST: Hypermetabolic lymph nodes in the supraclavicular, low internal jugular, subpectoral, axillary, mediastinal and hilar nodal stations. Index left axillary lymph node measures 1.5 cm (4/45), SUV max 5.7. Index low right paratracheal lymph node measures 11 mm (4/53), SUV max 5.2. No hypermetabolic pulmonary nodules. Incidental CT findings: Atherosclerotic calcification of the aorta and coronary arteries. Heart is at the upper limits of normal in size. No pericardial or pleural effusion. Near complete resolution of previously seen peribronchovascular ground-glass, nodularity and consolidation on 03/23/2021. ABDOMEN/PELVIS: Hypermetabolic lymph nodes the periportal regions, abdominopelvic retroperitoneum, small bowel mesentery and inguinal regions. Index periportal lymph node measures 2.4 cm (4/94)  with an SUV max 5.7. Index retroperitoneal lymph node is seen in the proximal right common iliac station, measuring 1.2 cm (4/124), SUV max 6.9. Index small bowel mesenteric lymph node is seen in the left paramidline mesentery, measuring 1.3 cm (4/115), SUV max 4.5. Finally, index left inguinal lymph node measures 11 mm (4/175), SUV max 4.2. No abnormal hypermetabolism in the liver, adrenal glands, spleen or pancreas. Incidental CT findings: Subcentimeter low-attenuation lesion in the peripheral dome of the liver, too small to characterize. Liver, gallbladder, adrenal glands, kidneys, spleen, pancreas, stomach and bowel are otherwise grossly unremarkable. Atherosclerotic calcification of the aorta. Calcified uterine fibroid. Bladder wall thickening. SKELETON: No abnormal hypermetabolism. Incidental CT findings: Degenerative changes in the spine. IMPRESSION: 1. Hypermetabolic adenopathy throughout the neck, chest, abdomen and pelvis, compatible with the given history of follicular lymphoma. 2. Near complete resolution of previously seen peribronchovascular ground-glass, nodularity and consolidation on 03/23/2021, indicative of resolved pneumonia. 3. Bladder wall thickening. 4. Aortic atherosclerosis (ICD10-I70.0). Coronary artery calcification. Electronically Signed   By: Lorin Picket M.D.   On: 04/21/2021 13:53   CT Biopsy  Result Date: 05/11/2021 INDICATION: 70 year old female with history of lymphoma, bone marrow biopsy requested for further workup. EXAM: CT-GUIDED BONE MARROW BIOPSY AND ASPIRATION MEDICATIONS: None ANESTHESIA/SEDATION: Fentanyl 100 mcg IV; Versed 2 mg IV Sedation Time: 11 minutes; The patient was continuously monitored during the procedure by the interventional radiology nurse under my direct supervision. COMPLICATIONS: None immediate. PROCEDURE: Informed consent was obtained from the patient following an explanation of the procedure,  risks, benefits and alternatives. The patient  understands, agrees and consents for the procedure. All questions were addressed. A time out was performed prior to the initiation of the procedure. The patient was positioned prone and non-contrast localization CT was performed of the pelvis to demonstrate the iliac marrow spaces. The operative site was prepped and draped in the usual sterile fashion. Under sterile conditions and local anesthesia, a 22 gauge spinal needle was utilized for procedural planning. Next, an 11 gauge coaxial bone biopsy needle was advanced into the right iliac marrow space. Needle position was confirmed with CT imaging. Initially, a bone marrow aspiration was performed. Next, a bone marrow biopsy was obtained with the 11 gauge outer bone marrow device. Samples were prepared with the cytotechnologist and deemed adequate. The needle was removed and superficial hemostasis was obtained with manual compression. A dressing was applied. The patient tolerated the procedure well without immediate post procedural complication. IMPRESSION: Successful CT guided right iliac bone marrow aspiration and core biopsy. Ruthann Cancer, MD [facility] Radiology Electronically Signed   By: Ruthann Cancer M.D.   On: 05/11/2021 12:50   CT BONE MARROW BIOPSY & ASPIRATION  Result Date: 05/11/2021 INDICATION: 70 year old female with history of lymphoma, bone marrow biopsy requested for further workup. EXAM: CT-GUIDED BONE MARROW BIOPSY AND ASPIRATION MEDICATIONS: None ANESTHESIA/SEDATION: Fentanyl 100 mcg IV; Versed 2 mg IV Sedation Time: 11 minutes; The patient was continuously monitored during the procedure by the interventional radiology nurse under my direct supervision. COMPLICATIONS: None immediate. PROCEDURE: Informed consent was obtained from the patient following an explanation of the procedure, risks, benefits and alternatives. The patient understands, agrees and consents for the procedure. All questions  were addressed. A time out was performed prior to the initiation of the procedure. The patient was positioned prone and non-contrast localization CT was performed of the pelvis to demonstrate the iliac marrow spaces. The operative site was prepped and draped in the usual sterile fashion. Under sterile conditions and local anesthesia, a 22 gauge spinal needle was utilized for procedural planning. Next, an 11 gauge coaxial bone biopsy needle was advanced into the right iliac marrow space. Needle position was confirmed with CT imaging. Initially, a bone marrow aspiration was performed. Next, a bone marrow biopsy was obtained with the 11 gauge outer bone marrow device. Samples were prepared with the cytotechnologist and deemed adequate. The needle was removed and superficial hemostasis was obtained with manual compression. A dressing was applied. The patient tolerated the procedure well without immediate post procedural complication. IMPRESSION: Successful CT guided right iliac bone marrow aspiration and core biopsy. Ruthann Cancer, MD [facility] Radiology Electronically Signed   By: Ruthann Cancer M.D.   On: 05/11/2021 12:50    ASSESSMENT & PLAN:   70 year old female with history of seizure disorder with  1) Recently diagnosed stage IVa follicular lymphoma PET/CT reviewed as noted above  2) bilateral lung infiltrates concerning for multifocal pneumonia.-Resolved  3) history of seizure disorder on Lamictal  4) normocytic anemia could be from lymphoma plus her recent pneumonia hemoglobin is improved to to 11.5 from 10.3 from 8.5. PLAN -Patient's lab results PET CT scan reviewed in detail -Bone marrow biopsy results were not available at the time of the clinic and patient was called after the clinic visit.  Bone marrow biopsy is consistent with involvement of the bone marrow with follicular lymphoma. -This represents stage IVa disease with likely low-grade  follicular lymphoma based on the  patient's clinical presentation and time course of her cervical lymph nodes which have been present for a few years.  Ki-67 was to be done on her lymph node biopsy and is still pending. -We have reached out to pathology about her Ki-67. -Patient was anemic when seen in the hospital but this was at least partly related to her pneumonia and her anemia is significantly improved her last hemoglobin up to 11.5. -Currently the patient has no limiting cytopenias no constitutional symptoms and feels well and has been working full-time. -PET CT scan does suggest fairly extensive involvement with follicular lymphoma but no organ threatening disease at this time. -Had a discussion with the patient for shared decision making and she is inclined to wait on initiating treatment for likely low-grade follicular lymphoma at this time have close followup -We shall set her up for appropriate follow-up.   All of the patients questions were answered with apparent satisfaction. The patient knows to call the clinic with any problems, questions or concerns.  . The total time spent in the appointment was 32 minutes.  The time was spent on direct patient contact counseling coordination of care with pathology review of PET/CT and bone marrow results and other labs and goals of care discussions.    Sullivan Lone MD MS AAHIVMS [facility] Hematology/Oncology Physician [facility]    .

## 2021-05-20 ENCOUNTER — Telehealth: Payer: Self-pay | Admitting: Hematology

## 2021-05-20 NOTE — Telephone Encounter (Signed)
I called and talked to Ms. Arlester Marker on the phone. We discussed her bone marrow biopsy results showing involvement with follicular lymphoma. Her last labs from 11/28 showed improvement in her hemoglobin to 11.5 with no other significant cytopenias. Patient notes that she is feeling well right now with no active symptoms attributable to her lymphoma.  No fatigue no constitutional symptoms.  Patient has significant lymphadenopathy in the neck chest abdomen and pelvis but there is no overt single areas of bulky disease or organ threatening disease at this time.  We discussed options of observation with close monitoring versus Rituxan monotherapy with maintenance. Patient would like to think about this treatments and follow-up with [place] in 6 to 8 weeks in clinic with repeat labs to make a decision about this. Will send scheduling message and to set her up for follow-up in 6 to 8 weeks with labs

## 2021-06-22 ENCOUNTER — Other Ambulatory Visit: Payer: Self-pay

## 2021-06-22 ENCOUNTER — Other Ambulatory Visit: Payer: Self-pay | Admitting: Physician Assistant

## 2021-06-22 ENCOUNTER — Ambulatory Visit (INDEPENDENT_AMBULATORY_CARE_PROVIDER_SITE_OTHER): Payer: BC Managed Care – PPO | Admitting: Physician Assistant

## 2021-06-22 ENCOUNTER — Encounter: Payer: Self-pay | Admitting: Physician Assistant

## 2021-06-22 VITALS — BP 120/70 | HR 77 | Temp 98.3°F | Ht 66.0 in | Wt 160.4 lb

## 2021-06-22 DIAGNOSIS — D229 Melanocytic nevi, unspecified: Secondary | ICD-10-CM

## 2021-06-22 DIAGNOSIS — L820 Inflamed seborrheic keratosis: Secondary | ICD-10-CM | POA: Diagnosis not present

## 2021-06-22 NOTE — Progress Notes (Signed)
Katie Mcneil is a 71 y.o. female here for a nevus.  History of Present Illness:   Chief Complaint  Patient presents with   Nevus    Pt c/o mole left groin area would like checked.    HPI  Nevus Katie Mcneil presents today due to concerns of a nevus on her left groin area. States that this has been onset for years, but due to recent dx of lymphoma she wanted to get the nevus evaluated and possibly removed.   She feels like it has gotten slightly bigger with time. Denies pain or discharge.   Past Medical History:  Diagnosis Date   Dyslipidemia    Seizures (HCC)      Social History   Tobacco Use   Smoking status: Never   Smokeless tobacco: Never  Vaping Use   Vaping Use: Never used  Substance Use Topics   Alcohol use: Not Currently    Alcohol/week: 0.0 standard drinks   Drug use: No    Past Surgical History:  Procedure Laterality Date   CATARACT EXTRACTION W/ INTRAOCULAR LENS IMPLANT Bilateral 2019   CESAREAN SECTION  1981, 1990   NO PAST SURGERIES     TUBAL LIGATION      Family History  Problem Relation Age of Onset   Neuropathy Mother    Anuerysm Father    Leukemia Sister 59   Alzheimer's disease Sister 31   Dementia Brother    Heart attack Maternal Grandmother    CVA Maternal Grandfather    Dementia Paternal Grandmother    Seizures Daughter    Other Daughter        Psychological disorder   Colon cancer Neg Hx     No Known Allergies  Current Medications:   Current Outpatient Medications:    acetaminophen (TYLENOL) 500 MG tablet, Take 1 tablet (500 mg total) by mouth every 8 (eight) hours as needed for moderate pain or fever., Disp: 25 tablet, Rfl: 0   albuterol (VENTOLIN HFA) 108 (90 Base) MCG/ACT inhaler, PLEASE SEE ATTACHED FOR DETAILED DIRECTIONS, Disp: , Rfl:    cetirizine (ZYRTEC) 10 MG tablet, Take 10 mg by mouth daily., Disp: , Rfl:    LamoTRIgine 300 MG TB24 24 hour tablet, Take 1 tablet (300 mg total) by mouth daily., Disp: 90 tablet, Rfl: 3    Omega-3 Fatty Acids (FISH OIL) 1000 MG CAPS, Take 1 capsule by mouth daily in the afternoon., Disp: , Rfl:    Vitamin D, Cholecalciferol, 25 MCG (1000 UT) CAPS, Take 1 capsule by mouth daily in the afternoon., Disp: , Rfl:    Review of Systems:   ROS Negative unless otherwise specified per HPI. Vitals:   Vitals:   06/22/21 1109  BP: 120/70  Pulse: 77  Temp: 98.3 F (36.8 C)  TempSrc: Temporal  SpO2: 95%  Weight: 160 lb 6.1 oz (72.7 kg)  Height: 5\' 6"  (1.676 m)     Body mass index is 25.89 kg/m.  Physical Exam:   Physical Exam Vitals and nursing note reviewed.  Constitutional:      General: She is not in acute distress.    Appearance: She is well-developed. She is not ill-appearing or toxic-appearing.  Cardiovascular:     Rate and Rhythm: Normal rate and regular rhythm.     Pulses: Normal pulses.     Heart sounds: Normal heart sounds, S1 normal and S2 normal.  Pulmonary:     Effort: Pulmonary effort is normal.     Breath sounds: Normal breath sounds.  Skin:    General: Skin is warm and dry.     Comments: 1/2 cm round raised verrucous lesion with slightly irregular borders, dark brown at anterior upper left thigh  Neurological:     Mental Status: She is alert.     GCS: GCS eye subscore is 4. GCS verbal subscore is 5. GCS motor subscore is 6.  Psychiatric:        Speech: Speech normal.        Behavior: Behavior normal. Behavior is cooperative.   Punch biopsy Meds, vitals, and allergies reviewed.  Indication: suspicious lesion Location: Left groin area Size: 0.5 cm raised lesion  Informed consent obtained.  Pt aware of risks not limited to but including infection, bleeding, damage to near by organs.  Prep: etoh/betadine Anesthesia: 2% lidocaine with epinephrine, good effect Punch made with 4 mm punch Minimal oozing, controlled with silver nitrate   Tolerated well   Assessment and Plan:   Nevus Punch procedure completed, patient tolerated well  Patient  informed of aftercare - keep area clean and bandaged, follow up if concerns/spreading erythema/pain. Derm path sent off per patient request  I,Havlyn C Ratchford,acting as a scribe for [name], PA.,have documented all relevant documentation on the behalf of [name], PA,as directed by  [name], PA while in the presence of [name], [place].  I, [name], [place], have reviewed all documentation for this visit. The documentation on 06/22/21 for the exam, diagnosis, procedures, and orders are all accurate and complete.   [name], PA-C

## 2021-06-22 NOTE — Patient Instructions (Signed)
It was great to see you!  We removed a skin lesion today.  I will be in touch with the results when they return and the next steps.  Take care,  [name] PA-C

## 2021-06-24 ENCOUNTER — Telehealth: Payer: Self-pay | Admitting: Hematology

## 2021-06-24 NOTE — Telephone Encounter (Signed)
Left message with follow-up appointment per 1/11 staff message.

## 2021-07-20 ENCOUNTER — Other Ambulatory Visit: Payer: Self-pay

## 2021-07-20 DIAGNOSIS — C8298 Follicular lymphoma, unspecified, lymph nodes of multiple sites: Secondary | ICD-10-CM

## 2021-07-21 ENCOUNTER — Other Ambulatory Visit: Payer: Self-pay

## 2021-07-21 ENCOUNTER — Inpatient Hospital Stay: Payer: Medicare Other

## 2021-07-21 ENCOUNTER — Inpatient Hospital Stay: Payer: Medicare Other | Attending: Hematology | Admitting: Hematology

## 2021-07-21 VITALS — BP 128/75 | HR 69 | Temp 97.9°F | Resp 18 | Wt 158.6 lb

## 2021-07-21 DIAGNOSIS — R918 Other nonspecific abnormal finding of lung field: Secondary | ICD-10-CM | POA: Diagnosis not present

## 2021-07-21 DIAGNOSIS — C8298 Follicular lymphoma, unspecified, lymph nodes of multiple sites: Secondary | ICD-10-CM | POA: Diagnosis not present

## 2021-07-21 DIAGNOSIS — D649 Anemia, unspecified: Secondary | ICD-10-CM | POA: Diagnosis not present

## 2021-07-21 DIAGNOSIS — Z806 Family history of leukemia: Secondary | ICD-10-CM | POA: Insufficient documentation

## 2021-07-21 DIAGNOSIS — Z79899 Other long term (current) drug therapy: Secondary | ICD-10-CM | POA: Insufficient documentation

## 2021-07-21 DIAGNOSIS — E785 Hyperlipidemia, unspecified: Secondary | ICD-10-CM | POA: Diagnosis not present

## 2021-07-21 DIAGNOSIS — G40909 Epilepsy, unspecified, not intractable, without status epilepticus: Secondary | ICD-10-CM | POA: Diagnosis not present

## 2021-07-21 DIAGNOSIS — C8228 Follicular lymphoma grade III, unspecified, lymph nodes of multiple sites: Secondary | ICD-10-CM | POA: Diagnosis not present

## 2021-07-21 LAB — LACTATE DEHYDROGENASE: LDH: 166 U/L (ref 98–192)

## 2021-07-21 LAB — CMP (CANCER CENTER ONLY)
ALT: 17 U/L (ref 0–44)
AST: 22 U/L (ref 15–41)
Albumin: 4.7 g/dL (ref 3.5–5.0)
Alkaline Phosphatase: 78 U/L (ref 38–126)
Anion gap: 8 (ref 5–15)
BUN: 19 mg/dL (ref 8–23)
CO2: 26 mmol/L (ref 22–32)
Calcium: 10.1 mg/dL (ref 8.9–10.3)
Chloride: 107 mmol/L (ref 98–111)
Creatinine: 0.72 mg/dL (ref 0.44–1.00)
GFR, Estimated: >60 mL/min (ref >60)
Glucose, Bld: 87 mg/dL (ref 70–99)
Potassium: 3.7 mmol/L (ref 3.5–5.1)
Sodium: 141 mmol/L (ref 135–145)
Total Bilirubin: 0.4 mg/dL (ref 0.3–1.2)
Total Protein: 7.5 g/dL (ref 6.5–8.1)

## 2021-07-21 LAB — CBC WITH DIFFERENTIAL (CANCER CENTER ONLY)
Abs Immature Granulocytes: 0.01 10*3/uL (ref 0.00–0.07)
Basophils Absolute: 0.1 10*3/uL (ref 0.0–0.1)
Basophils Relative: 1 %
Eosinophils Absolute: 0.2 10*3/uL (ref 0.0–0.5)
Eosinophils Relative: 4 %
HCT: 36.6 % (ref 36.0–46.0)
Hemoglobin: 11.7 g/dL — ABNORMAL LOW (ref 12.0–15.0)
Immature Granulocytes: 0 %
Lymphocytes Relative: 42 %
Lymphs Abs: 2.2 10*3/uL (ref 0.7–4.0)
MCH: 28.2 pg (ref 26.0–34.0)
MCHC: 32 g/dL (ref 30.0–36.0)
MCV: 88.2 fL (ref 80.0–100.0)
Monocytes Absolute: 0.4 10*3/uL (ref 0.1–1.0)
Monocytes Relative: 8 %
Neutro Abs: 2.3 10*3/uL (ref 1.7–7.7)
Neutrophils Relative %: 45 %
Platelet Count: 254 10*3/uL (ref 150–400)
RBC: 4.15 MIL/uL (ref 3.87–5.11)
RDW: 13.5 % (ref 11.5–15.5)
WBC Count: 5.1 10*3/uL (ref 4.0–10.5)
nRBC: 0 % (ref 0.0–0.2)

## 2021-07-21 LAB — FERRITIN: Ferritin: 50 ng/mL (ref 11–307)

## 2021-07-21 LAB — IRON AND IRON BINDING CAPACITY (CC-WL,HP ONLY)
Iron: 96 ug/dL (ref 28–170)
Saturation Ratios: 32 % — ABNORMAL HIGH (ref 10.4–31.8)
TIBC: 298 ug/dL (ref 250–450)
UIBC: 202 ug/dL (ref 148–442)

## 2021-07-27 NOTE — Progress Notes (Signed)
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 She notes that she has been feeling well and has no acute new concerns. No fevers no chills no night sweats no unexpected weight loss.  No new lumps or bumps.  Labs done today showed improved hemoglobin of 11.7, normal WBC count of 5.1k and platelets of 254k. Iron saturation of 32% with ferritin of 50 CMP within normal limits LDH within normal limits at 166   MEDICAL HISTORY:  Past Medical History:  Diagnosis Date   Dyslipidemia    Seizures ([place])     SURGICAL HISTORY: Past Surgical History:  Procedure Laterality Date   CATARACT EXTRACTION W/ INTRAOCULAR LENS IMPLANT Bilateral 2019   CESAREAN SECTION  1981, 1990   NO PAST SURGERIES     TUBAL LIGATION      SOCIAL HISTORY: Social History   Socioeconomic History   Marital status: Widowed    Spouse name: Not on file   Number of children: 2   Years of education: Not on file   Highest education level: Not on file  Occupational History   Not on file  Tobacco Use   Smoking status: Never   Smokeless tobacco: Never  Vaping Use   Vaping Use: Never used  Substance and Sexual Activity   Alcohol use: Not Currently    Alcohol/week: 0.0 standard drinks   Drug use: No   Sexual activity: Not Currently  Other Topics Concern   Not on file  Social History Narrative   Works in Engineer, 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[employer], [employer]   Lives with daughter (who has mental health issues)   Caregiver for mother   Has two children   Widowed   Social Determinants of Adult nurse Strain: Not on file  Food Insecurity: Not on file  Transportation Needs: Not on file  Physical Activity: Not on file  Stress: Not on file  Social Connections: Not on file  Intimate Partner Violence: Not on file    FAMILY HISTORY: Family History  Problem Relation Age of Onset   Neuropathy Mother    Anuerysm Father    Leukemia Sister 13   Alzheimer's disease Sister 89   Dementia Brother    Heart attack Maternal Grandmother    CVA Maternal Grandfather    Dementia Paternal Grandmother    Seizures Daughter    Other Daughter        Psychological disorder   Colon cancer Neg Hx     ALLERGIES:  has No Known Allergies.  MEDICATIONS:  Current Outpatient Medications  Medication Sig Dispense Refill   acetaminophen (TYLENOL) 500 MG tablet Take 1 tablet (500 mg total) by mouth every 8 (eight) hours as needed for moderate pain or fever. 25 tablet 0   albuterol (VENTOLIN HFA) 108 (90 Base) MCG/ACT inhaler PLEASE SEE ATTACHED FOR DETAILED DIRECTIONS     cetirizine (ZYRTEC) 10 MG tablet Take 10 mg by mouth daily.     LamoTRIgine 300 MG TB24 24 hour tablet Take 1 tablet (300 mg total) by mouth daily. 90 tablet 3   Omega-3 Fatty Acids (FISH OIL) 1000  MG CAPS Take 1 capsule by mouth daily in the afternoon.     Vitamin D, Cholecalciferol, 25 MCG (1000 UT) CAPS Take 1 capsule by mouth daily in the afternoon.     No current facility-administered medications for this visit.    .10 Point review of Systems was done is negative except as noted above.  PHYSICAL EXAMINATION: ECOG PERFORMANCE STATUS: 1 - Symptomatic but completely ambulatory  . Vitals:   07/21/21 1540  BP: 128/75  Pulse: 69  Resp: 18  Temp: 97.9 F (36.6 C)  SpO2: 100%   Filed Weights   07/21/21 1540  Weight: 158 lb 9.6 oz (71.9 kg)   .Body mass index is 25.6 kg/m.  GENERAL:alert, in no acute distress and comfortable SKIN: no acute rashes, no significant lesions EYES: conjunctiva are pink and  non-injected, sclera anicteric OROPHARYNX: MMM, no exudates, no oropharyngeal erythema or ulceration NECK: supple, no JVD LYMPH: No palpable cervical lymphadenopathy LUNGS: clear to auscultation b/l with normal respiratory effort HEART: regular rate & rhythm ABDOMEN:  normoactive bowel sounds , non tender, not distended. Extremity: no pedal edema PSYCH: alert & oriented x 3 with fluent speech NEURO: no focal motor/sensory deficits   LABORATORY DATA:  I have reviewed the data as listed  . CBC Latest Ref Rng & Units 07/21/2021 05/11/2021 04/02/2021  WBC 4.0 - 10.5 K/uL 5.1 5.9 7.4  Hemoglobin 12.0 - 15.0 g/dL 11.7(L) 11.5(L) 10.3(L)  Hematocrit 36.0 - 46.0 % 36.6 37.5 31.5(L)  Platelets 150 - 400 K/uL 254 230 594(H)   .CBC    Component Value Date/Time   WBC 5.1 07/21/2021 1506   WBC 5.9 05/11/2021 0719   RBC 4.15 07/21/2021 1506   HGB 11.7 (L) 07/21/2021 1506   HGB 11.3 12/14/2018 1017   HCT 36.6 07/21/2021 1506   HCT 31.5 (L) 04/02/2021 1304   PLT 254 07/21/2021 1506   PLT 289 12/14/2018 1017   MCV 88.2 07/21/2021 1506   MCV 87 12/14/2018 1017   MCH 28.2 07/21/2021 1506   MCHC 32.0 07/21/2021 1506   RDW 13.5 07/21/2021 1506   RDW 13.6 12/14/2018 1017   LYMPHSABS 2.2 07/21/2021 1506   LYMPHSABS 1.3 12/14/2018 1017   MONOABS 0.4 07/21/2021 1506   EOSABS 0.2 07/21/2021 1506   EOSABS 0.3 12/14/2018 1017   BASOSABS 0.1 07/21/2021 1506   BASOSABS 0.0 12/14/2018 1017    CMP Latest Ref Rng & Units 07/21/2021 04/02/2021 03/28/2021  Glucose 70 - 99 mg/dL 87 96 107(H)  BUN 8 - 23 mg/dL 19 9 7(L)  Creatinine 0.44 - 1.00 mg/dL 0.72 0.67 0.55  Sodium 135 - 145 mmol/L 141 138 137  Potassium 3.5 - 5.1 mmol/L 3.7 3.9 3.7  Chloride 98 - 111 mmol/L 107 106 105  CO2 22 - 32 mmol/L '26 25 25  ' Calcium 8.9 - 10.3 mg/dL 10.1 9.3 8.5(L)  Total Protein 6.5 - 8.1 g/dL 7.5 7.9 5.9(L)  Total Bilirubin 0.3 - 1.2 mg/dL 0.4 <0.2(L) 0.2(L)  Alkaline Phos 38 - 126 U/L 78 104 110  AST 15 - 41 U/L  22 21 51(H)  ALT 0 - 44 U/L 17 33 42   . Lab Results  Component Value Date   LDH 166 07/21/2021   Component     Latest Ref Rng & Units 04/02/2021  Iron     28 - 170 ug/dL 48  TIBC     250 - 450 ug/dL 234 (L)  Saturation Ratios     10.4 - 31.8 %  20  UIBC     ug/dL 186  Folate, Hemolysate     Not Estab. ng/mL 287.0  HCT     34.0 - 46.6 % 31.5 (L)  Folate, RBC     >498 ng/mL 911  DAT, complement      NEG  DAT, IgG      NEG . . .  Hep B Core Total Ab     NON REACTIVE NON REACTIVE  Hepatitis B Surface Ag     NON REACTIVE NON REACTIVE  HCV Ab     NON REACTIVE NON REACTIVE  Haptoglobin     37 - 355 mg/dL 174  Ferritin     11 - 307 ng/mL 131  Vitamin B12     180 - 914 pg/mL 789  LDH     98 - 192 U/L 181    SURGICAL PATHOLOGY  CASE: [id number]  PATIENT: Katie Mcneil  Surgical Pathology Report      Clinical History: diffuse LAN, no primary, R/O lymphoma (cm)    FINAL MICROSCOPIC DIAGNOSIS:   A. LYMPH NODE, RIGHT INGUINAL, NEEDLE CORE BIOPSY:  -  Non-Hodgkin B-cell lymphoma  -  See comment   COMMENT:   The biopsy consists of three small lymph node cores composed of a  monotonous proliferation of small to medium sized lymphocytes without  well-formed, back to back follicles.  By immunohistochemistry there is a  slight predominance of B cells by CD20 and CD3 immunohistochemistry.  The B cells appear to co-express CD79a, CD10, BCL6 and Bcl-2 both within  follicles and within the interfollicular spaces. CD23 shows expanded  dendritic meshworks. The B-cells are negative for CD5 and cyclin D1.  Flow cytometry identified a kappa-restricted monoclonal B-cell  population expressing CD10 (42% of all lymphocytes; See [id number]).  Overall, the findings are consistent with a non-Hodgkin B-cell lymphoma,  most likely follicular lymphoma.  [place] for Y(09;98) is pending and will  be reported in an addendum along with ki-67.  Unfortunately, given the  limited and  fragmented nature of the sample accurate grading of the  lymphoma is not possible on the sample provided.   SURGICAL PATHOLOGY Surgical Pathology   THIS IS AN ADDENDUM REPORT   CASE: [id number]  PATIENT: Katie Mcneil  Bone Marrow Report  Addendum    Reason for Addendum #1:  Molecular Add-on   Clinical History: Follicular lymphoma of lymphoma nodes of multiple  regions, unspecified grade ([place])  ,( [facility])    DIAGNOSIS:   BONE MARROW, ASPIRATE, CLOT, CORE:  -  Hypercellular marrow involved by follicular lymphoma  -  See microscopic description and comment below   PERIPHERAL BLOOD:  -  Normocytic anemia  -  See complete blood cell count   COMMENT:   The combined morphologic and flow cytometric features are consistent  with bone marrow involvement by the patient's previously diagnosed  follicular lymphoma.  Correlation with cytogenetics is recommended.    ADDENDUM:   ** Please note this testing was performed and interpreted by an outside  facility ([facility]).  This addendum is only being added to provide a  summary of the results for report completeness.  Please see electronic  medical record for a copy of the full report. **   Karyotype:  46,X,t(X;18)(q13;q11.2),+der(X)t(X;18),add(6)(q12),-11,t(12;13)(q24.1;q1  2),t(14;18)(q32;q21)[2]/46,XX[18]   Interpretation:  ABNORMAL FEMALE KARYOTYPE   The abnormalities include loss of material from the long arm of  chromosome 6 and the t(14;18)(q32;q21) - both recurring abnormalities in  lymphomas. The t(14;18)(q32;q21.3), involving the [facility]  gene at 14q32.3  and the BCL2 gene at 18q21.3, is observed in 50% of follicular  lymphomas.    RADIOGRAPHIC STUDIES: I have personally reviewed the radiological images as listed and agreed with the findings in the report. No results found.  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 PLAN -Patient has no clinical symptoms or signs of significant follicular lymphoma progression at this time. Labs done today showed improved hemoglobin of 11.7, normal WBC count of 5.1k and platelets of 254k. Iron saturation of 32% with ferritin of 50 CMP within normal limits LDH within normal limits at 166  -Patient has no significant symptomatic follicular lymphoma progression at this time -No acute new indications to treat her low-grade follicular lymphoma at this time and patient prefers to take a more conservative approach.  All of the patients questions were answered with apparent satisfaction. The patient knows to call the clinic with any problems, questions or concerns.   Sullivan Lone MD MS AAHIVMS [facility] Hematology/Oncology Physician [facility]    .

## 2021-09-07 ENCOUNTER — Telehealth: Payer: Self-pay | Admitting: Adult Health

## 2021-09-07 NOTE — Telephone Encounter (Signed)
Received a letter from the patient regarding her visit from September 2022.  Patient voiced some complaints in the letter about her visit.  Asking for charges to be reversed.  I have reached out to the patient and left a voice message.   ? ?The patient scheduled a follow-up appointment with me in September? ?

## 2021-12-03 ENCOUNTER — Encounter: Payer: Self-pay | Admitting: Physician Assistant

## 2021-12-03 ENCOUNTER — Ambulatory Visit (INDEPENDENT_AMBULATORY_CARE_PROVIDER_SITE_OTHER): Payer: Medicare Other | Admitting: Physician Assistant

## 2021-12-03 VITALS — BP 109/69 | HR 71 | Temp 98.0°F | Ht 66.0 in | Wt 163.4 lb

## 2021-12-03 DIAGNOSIS — R82998 Other abnormal findings in urine: Secondary | ICD-10-CM

## 2021-12-03 DIAGNOSIS — E785 Hyperlipidemia, unspecified: Secondary | ICD-10-CM

## 2021-12-03 DIAGNOSIS — R569 Unspecified convulsions: Secondary | ICD-10-CM

## 2021-12-03 DIAGNOSIS — Z0000 Encounter for general adult medical examination without abnormal findings: Secondary | ICD-10-CM

## 2021-12-03 DIAGNOSIS — C8298 Follicular lymphoma, unspecified, lymph nodes of multiple sites: Secondary | ICD-10-CM | POA: Diagnosis not present

## 2021-12-03 DIAGNOSIS — E663 Overweight: Secondary | ICD-10-CM | POA: Diagnosis not present

## 2021-12-03 LAB — URINALYSIS, ROUTINE W REFLEX MICROSCOPIC
Bilirubin Urine: NEGATIVE
Hgb urine dipstick: NEGATIVE
Ketones, ur: NEGATIVE
Leukocytes,Ua: NEGATIVE
Nitrite: NEGATIVE
RBC / HPF: NONE SEEN (ref 0–?)
Specific Gravity, Urine: 1.015 (ref 1.000–1.030)
Total Protein, Urine: NEGATIVE
Urine Glucose: NEGATIVE
Urobilinogen, UA: 0.2 (ref 0.0–1.0)
pH: 7 (ref 5.0–8.0)

## 2021-12-03 LAB — COMPREHENSIVE METABOLIC PANEL
ALT: 15 U/L (ref 0–35)
AST: 20 U/L (ref 0–37)
Albumin: 4.2 g/dL (ref 3.5–5.2)
Alkaline Phosphatase: 70 U/L (ref 39–117)
BUN: 17 mg/dL (ref 6–23)
CO2: 31 mEq/L (ref 19–32)
Calcium: 9.5 mg/dL (ref 8.4–10.5)
Chloride: 106 mEq/L (ref 96–112)
Creatinine, Ser: 0.73 mg/dL (ref 0.40–1.20)
GFR: 83.58 mL/min (ref >60.00)
Glucose, Bld: 90 mg/dL (ref 70–99)
Potassium: 3.9 mEq/L (ref 3.5–5.1)
Sodium: 142 mEq/L (ref 135–145)
Total Bilirubin: 0.4 mg/dL (ref 0.2–1.2)
Total Protein: 6.8 g/dL (ref 6.0–8.3)

## 2021-12-03 LAB — CBC WITH DIFFERENTIAL/PLATELET
Basophils Absolute: 0.1 10*3/uL (ref 0.0–0.1)
Basophils Relative: 2.8 % (ref 0.0–3.0)
Eosinophils Absolute: 0.3 10*3/uL (ref 0.0–0.7)
Eosinophils Relative: 5.1 % — ABNORMAL HIGH (ref 0.0–5.0)
HCT: 34.4 % — ABNORMAL LOW (ref 36.0–46.0)
Hemoglobin: 11.2 g/dL — ABNORMAL LOW (ref 12.0–15.0)
Lymphocytes Relative: 38.9 % (ref 12.0–46.0)
Lymphs Abs: 2.1 10*3/uL (ref 0.7–4.0)
MCHC: 32.6 g/dL (ref 30.0–36.0)
MCV: 88.3 fl (ref 78.0–100.0)
Monocytes Absolute: 0.6 10*3/uL (ref 0.1–1.0)
Monocytes Relative: 11.9 % (ref 3.0–12.0)
Neutro Abs: 2.2 10*3/uL (ref 1.4–7.7)
Neutrophils Relative %: 41.3 % — ABNORMAL LOW (ref 43.0–77.0)
Platelets: 240 10*3/uL (ref 150.0–400.0)
RBC: 3.89 Mil/uL (ref 3.87–5.11)
RDW: 14.6 % (ref 11.5–15.5)
WBC: 5.3 10*3/uL (ref 4.0–10.5)

## 2021-12-03 LAB — LIPID PANEL
Cholesterol: 292 mg/dL — ABNORMAL HIGH (ref 0–200)
HDL: 68.1 mg/dL (ref >39.00)
LDL Cholesterol: 203 mg/dL — ABNORMAL HIGH (ref 0–99)
NonHDL: 224.1
Total CHOL/HDL Ratio: 4
Triglycerides: 107 mg/dL (ref 0.0–149.0)
VLDL: 21.4 mg/dL (ref 0.0–40.0)

## 2021-12-03 NOTE — Patient Instructions (Addendum)
It was great to see you!  Please call [facility] to schedule your colonoscopy  Please get your Bone Density and Mammogram next month  Please go to the lab for blood work.   Our office will call you with your results unless you have chosen to receive results via MyChart.  If your blood work is normal we will follow-up each year for physicals and as scheduled for chronic medical problems.  If anything is abnormal we will treat accordingly and get you in for a follow-up.  Take care,  Aldona Bar

## 2021-12-07 ENCOUNTER — Telehealth: Payer: Self-pay | Admitting: Physician Assistant

## 2021-12-07 NOTE — Telephone Encounter (Signed)
Please give patient a call back with lab results  °

## 2021-12-08 ENCOUNTER — Other Ambulatory Visit: Payer: Self-pay | Admitting: *Deleted

## 2021-12-08 DIAGNOSIS — E785 Hyperlipidemia, unspecified: Secondary | ICD-10-CM

## 2021-12-08 NOTE — Telephone Encounter (Signed)
See result notes. 

## 2021-12-31 ENCOUNTER — Other Ambulatory Visit: Payer: Self-pay

## 2021-12-31 DIAGNOSIS — C8298 Follicular lymphoma, unspecified, lymph nodes of multiple sites: Secondary | ICD-10-CM

## 2021-12-31 LAB — HM PAP SMEAR

## 2022-01-04 ENCOUNTER — Inpatient Hospital Stay: Payer: Medicare Other | Attending: Hematology

## 2022-01-04 ENCOUNTER — Other Ambulatory Visit: Payer: Self-pay

## 2022-01-04 ENCOUNTER — Inpatient Hospital Stay (HOSPITAL_BASED_OUTPATIENT_CLINIC_OR_DEPARTMENT_OTHER): Payer: Medicare Other | Admitting: Hematology

## 2022-01-04 VITALS — BP 132/68 | HR 87 | Temp 98.1°F | Resp 20 | Wt 165.1 lb

## 2022-01-04 DIAGNOSIS — D649 Anemia, unspecified: Secondary | ICD-10-CM | POA: Diagnosis not present

## 2022-01-04 DIAGNOSIS — C8298 Follicular lymphoma, unspecified, lymph nodes of multiple sites: Secondary | ICD-10-CM

## 2022-01-04 LAB — CMP (CANCER CENTER ONLY)
ALT: 15 U/L (ref 0–44)
AST: 22 U/L (ref 15–41)
Albumin: 4.4 g/dL (ref 3.5–5.0)
Alkaline Phosphatase: 82 U/L (ref 38–126)
Anion gap: 6 (ref 5–15)
BUN: 11 mg/dL (ref 8–23)
CO2: 29 mmol/L (ref 22–32)
Calcium: 9.6 mg/dL (ref 8.9–10.3)
Chloride: 107 mmol/L (ref 98–111)
Creatinine: 0.67 mg/dL (ref 0.44–1.00)
GFR, Estimated: >60 mL/min (ref >60)
Glucose, Bld: 95 mg/dL (ref 70–99)
Potassium: 3.6 mmol/L (ref 3.5–5.1)
Sodium: 142 mmol/L (ref 135–145)
Total Bilirubin: 0.7 mg/dL (ref 0.3–1.2)
Total Protein: 6.9 g/dL (ref 6.5–8.1)

## 2022-01-04 LAB — CBC WITH DIFFERENTIAL (CANCER CENTER ONLY)
Abs Immature Granulocytes: 0.01 10*3/uL (ref 0.00–0.07)
Basophils Absolute: 0 10*3/uL (ref 0.0–0.1)
Basophils Relative: 1 %
Eosinophils Absolute: 0.2 10*3/uL (ref 0.0–0.5)
Eosinophils Relative: 4 %
HCT: 36 % (ref 36.0–46.0)
Hemoglobin: 11.7 g/dL — ABNORMAL LOW (ref 12.0–15.0)
Immature Granulocytes: 0 %
Lymphocytes Relative: 31 %
Lymphs Abs: 1.6 10*3/uL (ref 0.7–4.0)
MCH: 28.4 pg (ref 26.0–34.0)
MCHC: 32.5 g/dL (ref 30.0–36.0)
MCV: 87.4 fL (ref 80.0–100.0)
Monocytes Absolute: 0.5 10*3/uL (ref 0.1–1.0)
Monocytes Relative: 10 %
Neutro Abs: 2.8 10*3/uL (ref 1.7–7.7)
Neutrophils Relative %: 54 %
Platelet Count: 248 10*3/uL (ref 150–400)
RBC: 4.12 MIL/uL (ref 3.87–5.11)
RDW: 13.7 % (ref 11.5–15.5)
WBC Count: 5.1 10*3/uL (ref 4.0–10.5)
nRBC: 0 % (ref 0.0–0.2)

## 2022-01-04 LAB — IRON AND IRON BINDING CAPACITY (CC-WL,HP ONLY)
Iron: 113 ug/dL (ref 28–170)
Saturation Ratios: 41 % — ABNORMAL HIGH (ref 10.4–31.8)
TIBC: 277 ug/dL (ref 250–450)
UIBC: 164 ug/dL (ref 148–442)

## 2022-01-04 LAB — FERRITIN: Ferritin: 24 ng/mL (ref 11–307)

## 2022-01-04 LAB — LACTATE DEHYDROGENASE: LDH: 179 U/L (ref 98–192)

## 2022-01-04 NOTE — Progress Notes (Signed)
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 She reports She is doing well with no new symptoms or concerns.  She reports persistent grade 1 fatigue.  We discussed getting CT neck/chest/abd/pelvis to evaluate for possible disease progression which she was agreeable to.  No fever, chills, night sweats. No new lumps, bumps, or lesions/rashes. No new or unexpected weight loss. No other new or acute focal symptoms.  Labs done today were reviewed in detail.  MEDICAL HISTORY:  Past Medical History:  Diagnosis Date   Dyslipidemia    Seizures ([place])     SURGICAL HISTORY: Past Surgical History:  Procedure Laterality Date   CATARACT EXTRACTION W/ INTRAOCULAR LENS IMPLANT Bilateral 2019   CESAREAN SECTION  1981, 1990   NO PAST SURGERIES     TUBAL LIGATION      SOCIAL HISTORY: Social History   Socioeconomic History   Marital status: Widowed    Spouse name: Not on file   Number of children: 2   Years of education: Not on file   Highest education level: Not on file  Occupational History   Not on file  Tobacco Use   Smoking status: Never   Smokeless tobacco: Never  Vaping Use   Vaping Use: Never used  Substance and Sexual Activity   Alcohol use: Not Currently    Alcohol/week: 0.0 standard drinks of alcohol   Drug use: No   Sexual activity: Not Currently  Other Topics Concern   Not on file  Social History Narrative   Works in Engineer, 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[employer], contracting   Lives with daughter (who has mental health issues)   Caregiver for mother   Has two children   Widowed    Social Determinants of Radio broadcast assistant Strain: Not on file  Food Insecurity: Not on file  Transportation Needs: Not on file  Physical Activity: Not on file  Stress: Not on file  Social Connections: Not on file  Intimate Partner Violence: Not on file    FAMILY HISTORY: Family History  Problem Relation Age of Onset   Neuropathy Mother    Anuerysm Father    Leukemia Sister 73   Alzheimer's disease Sister 46   Dementia Brother    Heart attack Maternal Grandmother    CVA Maternal Grandfather    Dementia Paternal Grandmother    Seizures Daughter    Other Daughter        Psychological disorder   Colon cancer Neg Hx     ALLERGIES:  has No Known Allergies.  MEDICATIONS:  Current Outpatient Medications  Medication Sig Dispense Refill   acetaminophen (TYLENOL) 500 MG tablet Take 1 tablet (500 mg total) by mouth every 8 (eight) hours as needed for moderate pain or fever. 25 tablet 0   cetirizine (ZYRTEC) 10 MG tablet Take 10 mg by mouth daily.     LamoTRIgine 300 MG TB24 24 hour tablet Take 1 tablet (300 mg total) by mouth daily. 90 tablet 3   Omega-3 Fatty Acids (FISH OIL) 1000 MG CAPS Take 1 capsule by mouth daily in the afternoon.     pyridOXINE (VITAMIN B-6) 50  MG tablet Take 50 mg by mouth daily.     Vitamin D, Cholecalciferol, 25 MCG (1000 UT) CAPS Take 1 capsule by mouth daily in the afternoon.     No current facility-administered medications for this visit.    .10 Point review of Systems was done is negative except as noted above.  PHYSICAL EXAMINATION: ECOG PERFORMANCE STATUS: 1 - Symptomatic but completely ambulatory  . Vitals:   01/04/22 1441  BP: 132/68  Pulse: 87  Resp: 20  Temp: 98.1 F (36.7 C)  SpO2: 98%   Filed Weights   01/04/22 1441  Weight: 165 lb 1.6 oz (74.9 kg)   .Body mass index is 26.65 kg/m.  NAD GENERAL:alert, in no acute distress and comfortable SKIN: no acute rashes, no significant lesions EYES: conjunctiva are pink  and non-injected, sclera anicteric NECK: supple, no JVD LYMPH:  no palpable lymphadenopathy in the cervical, axillary or inguinal regions LUNGS: clear to auscultation b/l with normal respiratory effort HEART: regular rate & rhythm ABDOMEN:  normoactive bowel sounds , non tender, not distended. Extremity: no pedal edema PSYCH: alert & oriented x 3 with fluent speech NEURO: no focal motor/sensory deficits  LABORATORY DATA:  I have reviewed the data as listed  .    Latest Ref Rng & Units 01/04/2022    2:20 PM 12/03/2021    9:27 AM 07/21/2021    3:06 PM  CBC  WBC 4.0 - 10.5 K/uL 5.1  5.3  5.1   Hemoglobin 12.0 - 15.0 g/dL 11.7  11.2  11.7   Hematocrit 36.0 - 46.0 % 36.0  34.4  36.6   Platelets 150 - 400 K/uL 248  240.0  254    .CBC    Component Value Date/Time   WBC 5.1 01/04/2022 1420   WBC 5.3 12/03/2021 0927   RBC 4.12 01/04/2022 1420   HGB 11.7 (L) 01/04/2022 1420   HGB 11.3 12/14/2018 1017   HCT 36.0 01/04/2022 1420   HCT 31.5 (L) 04/02/2021 1304   PLT 248 01/04/2022 1420   PLT 289 12/14/2018 1017   MCV 87.4 01/04/2022 1420   MCV 87 12/14/2018 1017   MCH 28.4 01/04/2022 1420   MCHC 32.5 01/04/2022 1420   RDW 13.7 01/04/2022 1420   RDW 13.6 12/14/2018 1017   LYMPHSABS 1.6 01/04/2022 1420   LYMPHSABS 1.3 12/14/2018 1017   MONOABS 0.5 01/04/2022 1420   EOSABS 0.2 01/04/2022 1420   EOSABS 0.3 12/14/2018 1017   BASOSABS 0.0 01/04/2022 1420   BASOSABS 0.0 12/14/2018 1017       Latest Ref Rng & Units 01/04/2022    2:20 PM 12/03/2021    9:27 AM 07/21/2021    3:06 PM  CMP  Glucose 70 - 99 mg/dL 95  90  87   BUN 8 - 23 mg/dL '11  17  19   ' Creatinine 0.44 - 1.00 mg/dL 0.67  0.73  0.72   Sodium 135 - 145 mmol/L 142  142  141   Potassium 3.5 - 5.1 mmol/L 3.6  3.9  3.7   Chloride 98 - 111 mmol/L 107  106  107   CO2 22 - 32 mmol/L '29  31  26   ' Calcium 8.9 - 10.3 mg/dL 9.6  9.5  10.1   Total Protein 6.5 - 8.1 g/dL 6.9  6.8  7.5   Total Bilirubin 0.3 - 1.2 mg/dL 0.7  0.4   0.4   Alkaline Phos 38 - 126 U/L 82  70  78  AST 15 - 41 U/L '22  20  22   ' ALT 0 - 44 U/L '15  15  17    ' . Lab Results  Component Value Date   LDH 166 07/21/2021   Component     Latest Ref Rng & Units 04/02/2021  Iron     28 - 170 ug/dL 48  TIBC     250 - 450 ug/dL 234 (L)  Saturation Ratios     10.4 - 31.8 % 20  UIBC     ug/dL 186  Folate, Hemolysate     Not Estab. ng/mL 287.0  HCT     34.0 - 46.6 % 31.5 (L)  Folate, RBC     >498 ng/mL 911  DAT, complement      NEG  DAT, IgG      NEG . . .  Hep B Core Total Ab     NON REACTIVE NON REACTIVE  Hepatitis B Surface Ag     NON REACTIVE NON REACTIVE  HCV Ab     NON REACTIVE NON REACTIVE  Haptoglobin     37 - 355 mg/dL 174  Ferritin     11 - 307 ng/mL 131  Vitamin B12     180 - 914 pg/mL 789  LDH     98 - 192 U/L 181    SURGICAL PATHOLOGY  CASE: [id number]  PATIENT: [place]  Surgical Pathology Report      Clinical History: diffuse LAN, no primary, R/O lymphoma (cm)    FINAL MICROSCOPIC DIAGNOSIS:   A. LYMPH NODE, RIGHT INGUINAL, NEEDLE CORE BIOPSY:  -  Non-Hodgkin B-cell lymphoma  -  See comment   COMMENT:   The biopsy consists of three small lymph node cores composed of a  monotonous proliferation of small to medium sized lymphocytes without  well-formed, back to back follicles.  By immunohistochemistry there is a  slight predominance of B cells by CD20 and CD3 immunohistochemistry.  The B cells appear to co-express CD79a, CD10, BCL6 and Bcl-2 both within  follicles and within the interfollicular spaces. CD23 shows expanded  dendritic meshworks. The B-cells are negative for CD5 and cyclin D1.  Flow cytometry identified a kappa-restricted monoclonal B-cell  population expressing CD10 (42% of all lymphocytes; See [id number]).  Overall, the findings are consistent with a non-Hodgkin B-cell lymphoma,  most likely follicular lymphoma.  [place] for T(95;39) is pending and will  be reported in an  addendum along with ki-67.  Unfortunately, given the  limited and fragmented nature of the sample accurate grading of the  lymphoma is not possible on the sample provided.   SURGICAL PATHOLOGY Surgical Pathology   THIS IS AN ADDENDUM REPORT   CASE: [id number]  PATIENT: Mykelti Fuhriman  Bone Marrow Report  Addendum    Reason for Addendum #1:  Molecular Add-on   Clinical History: Follicular lymphoma of lymphoma nodes of multiple  regions, unspecified grade ([place])  ,( [facility])    DIAGNOSIS:   BONE MARROW, ASPIRATE, CLOT, CORE:  -  Hypercellular marrow involved by follicular lymphoma  -  See microscopic description and comment below   PERIPHERAL BLOOD:  -  Normocytic anemia  -  See complete blood cell count   COMMENT:   The combined morphologic and flow cytometric features are consistent  with bone marrow involvement by the patient's previously diagnosed  follicular lymphoma.  Correlation with cytogenetics is recommended.    ADDENDUM:   ** Please note this testing was performed and interpreted  by an outside  facility ([facility]).  This addendum is only being added to provide a  summary of the results for report completeness.  Please see electronic  medical record for a copy of the full report. **   Karyotype:  46,X,t(X;18)(q13;q11.2),+der(X)t(X;18),add(6)(q12),-11,t(12;13)(q24.1;q1  2),t(14;18)(q32;q21)[2]/46,XX[18]   Interpretation:  ABNORMAL FEMALE KARYOTYPE   The abnormalities include loss of material from the long arm of  chromosome 6 and the t(14;18)(q32;q21) - both recurring abnormalities in  lymphomas. The t(14;18)(q32;q21.3), involving the IGH gene at 14q32.3  and the BCL2 gene at 18q21.3, is observed in 09% of follicular  lymphomas.    RADIOGRAPHIC STUDIES: I have personally reviewed the radiological images as listed and agreed with the findings in the report. No results found.  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  PLAN -Patient has no clinical symptoms or signs of significant follicular lymphoma progression at this time. Labs done today showed improved hemoglobin of 11.7, normal WBC count of 5.1k and platelets of 248k. Iron saturation of 41% with ferritin of 24 CMP within normal limits LDH within normal limits at 179 -Patient has no significant symptomatic follicular lymphoma progression at this time -No acute new indications to treat her low-grade follicular lymphoma at this time and patient prefers to take a more conservative approach. -Schedule CT neck/chest/abd/pelvis in 24 weeks.  FOLLOW UP CT neck/chest/abd/pelvis in 24 weeks RTC with Dr Irene Limbo with labs in 25 weeks  .The total time spent in the appointment was 20 minutes* .  All of the patient's questions were answered with apparent satisfaction. The patient knows to call the clinic with any problems, questions or concerns.   Sullivan Lone MD MS AAHIVMS [facility] Hematology/Oncology Physician [facility]  .*Total Encounter Time as defined by the Centers for Medicare and Medicaid Services includes, in addition to the face-to-face time of a patient visit (documented in the note above) non-face-to-face time: obtaining and reviewing outside history, ordering and reviewing medications, tests or procedures, care coordination (communications with other health care professionals or caregivers) and documentation in the medical record.  I, Melene Muller, am acting as scribe for Dr. Sullivan Lone, MD. .I have reviewed the above documentation for accuracy and completeness, and I agree with the above. Brunetta Genera MD

## 2022-01-18 ENCOUNTER — Ambulatory Visit: Payer: Medicare Other | Admitting: Hematology

## 2022-01-18 ENCOUNTER — Other Ambulatory Visit: Payer: Medicare Other

## 2022-01-19 ENCOUNTER — Other Ambulatory Visit: Payer: Self-pay | Admitting: Adult Health

## 2022-01-20 ENCOUNTER — Encounter: Payer: Self-pay | Admitting: Adult Health

## 2022-01-20 ENCOUNTER — Ambulatory Visit (INDEPENDENT_AMBULATORY_CARE_PROVIDER_SITE_OTHER): Payer: Medicare Other | Admitting: Adult Health

## 2022-01-20 VITALS — BP 114/68 | HR 79 | Ht 66.0 in | Wt 166.0 lb

## 2022-01-20 DIAGNOSIS — R569 Unspecified convulsions: Secondary | ICD-10-CM

## 2022-01-20 DIAGNOSIS — Z5181 Encounter for therapeutic drug level monitoring: Secondary | ICD-10-CM

## 2022-01-20 MED ORDER — LAMOTRIGINE ER 300 MG PO TB24: 1.0000 | ORAL_TABLET | Freq: Every day | ORAL | 3 refills | Status: DC

## 2022-01-20 NOTE — Progress Notes (Signed)
PATIENT: Katie Mcneil DOB: April 22, 1951  REASON FOR VISIT: follow up HISTORY FROM: patient  Chief Complaint  Patient presents with   RM 4    Here alone for 1 year seizure follow-up. Pt denies any seizures since her last visit. She denies any difficulty remembering her medication each day. She reports she was diagnosed with Lymphoma October 2022.     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 No changes with her gait or balance. [place] last September 2022 but thinks it was because she was moving   01/14/21: [name] is a 71 year female with a history of seizures.  She returns today for follow-up.  She continues on Lamictal extended release 300 mg daily.  She denies any seizure events.  Overall she feels that she is tolerating the medication well.  Denies any new symptoms.  No changes with her gait or balance.  Recently had blood work with her PCP  01/15/20: [name] Is a 71 year old female with a hist4ory of seizures.  She returns today for follow-up.  She remains on Lamictal extended release 300 mg daily.  She denies any seizure events.  Denies any changes with her gait or balance.  She does report that she is under a lot of stress right now with her daughter who is suffering from mental illness.  She also feels that she may have ADD but this has not been formally diagnosed.  HISTORY 12/14/18:   [name] is a 71 year old female with a history of seizures.  She returns today for follow-up.  She remains on Lamictal 150 mg twice a day.  She is able to complete all ADLs independently.  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  She states that when she takes the full dose of Lamictal at bedtime she feels drowsy the next morning.  She states that there is been times that she only took half a dose and she felt better the next morning.  She has not had any seizure events.  She reports that she has headaches  but she feels that this is sinus related.  Reports that she has nasal congestion daily.  She returns today for follow-up  REVIEW OF SYSTEMS: Out of a complete 14 system review of symptoms, the patient complains only of the following symptoms, and all other reviewed systems are negative.  See HPI ALLERGIES: No Known Allergies  HOME MEDICATIONS: Outpatient Medications Prior to Visit  Medication Sig Dispense Refill   acetaminophen (TYLENOL) 500 MG tablet Take 1 tablet (500 mg total) by mouth every 8 (eight) hours as needed for moderate pain or fever. 25 tablet 0   cetirizine (ZYRTEC) 10 MG tablet Take 10 mg by mouth daily.     LamoTRIgine 300 MG TB24 24 hour tablet TAKE 1 TABLET BY MOUTH EVERY DAY 30 tablet 0   Omega-3 Fatty Acids (FISH OIL) 1000 MG CAPS Take 1 capsule by mouth daily in the afternoon.     pyridOXINE (VITAMIN B-6) 50 MG tablet Take 50 mg by mouth daily.     Vitamin D, Cholecalciferol, 25 MCG (1000 UT) CAPS Take 1 capsule by mouth daily in the afternoon.     No facility-administered medications prior to visit.    PAST MEDICAL HISTORY: Past Medical History:  Diagnosis Date   Dyslipidemia    Lymphoma ([place]) 03/2021   Seizures ([place])     PAST SURGICAL HISTORY: Past Surgical History:  Procedure Laterality Date   CATARACT EXTRACTION W/ INTRAOCULAR LENS IMPLANT Bilateral 2019   CESAREAN SECTION  1981, 1990   NO PAST SURGERIES     TUBAL LIGATION      FAMILY HISTORY: Family History  Problem Relation Age of Onset   Neuropathy Mother    Anuerysm Father    Leukemia Sister 4   Alzheimer's disease Sister 76   Dementia Brother    Heart attack Maternal Grandmother    CVA Maternal Grandfather    Dementia Paternal Grandmother    Seizures Daughter    Other Daughter        Psychological disorder   Colon cancer Neg Hx     SOCIAL HISTORY: Social History   Socioeconomic History   Marital status: Widowed    Spouse name: Not on file   Number of children: 2   Years of  education: Not on file   Highest education level: Not on file  Occupational History   Not on file  Tobacco Use   Smoking status: Never   Smokeless tobacco: Never  Vaping Use   Vaping Use: Never used  Substance and Sexual Activity   Alcohol use: Not Currently    Alcohol/week: 0.0 standard drinks of alcohol   Drug use: No   Sexual activity: Not Currently  Other Topics Concern   Not on file  Social History Narrative   Retired from working in Engineer, 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Civil engineer, contracting   Lives with daughter (who has mental health issues)   Has two children   Widowed   Social Determinants of Radio broadcast assistant Strain: Not on file  Food Insecurity: Not on file  Transportation Needs: Not on file  Physical Activity: Not on file  Stress: Not on file  Social Connections: Not on file  Intimate Partner Violence: Not on file      PHYSICAL EXAM  Vitals:   01/20/22 1524  BP: 114/68  Pulse: 79  Weight: 166 lb (75.3 kg)  Height: '5\' 6"'$  (1.676 m)   Body mass index is 26.79 kg/m.  Generalized: Well developed, in no acute distress   Neurological examination  Mentation: Alert oriented to time, place, history taking. Follows all commands speech and language fluent Cranial nerve II-XII: Pupils were equal round reactive to light. Extraocular movements were full, visual field were full on confrontational test. . Head turning and shoulder shrug  were normal and symmetric. Motor: The motor testing reveals 5 over 5 strength of all 4 extremities. Good symmetric motor tone is noted throughout.  Sensory: Sensory testing is intact to soft touch on all 4 extremities. No evidence of extinction is noted.  Coordination: Cerebellar testing reveals good finger-nose-finger and heel-to-shin bilaterally.  Gait and station: Gait is normal.  Reflexes: Deep tendon reflexes are symmetric and normal bilaterally.   DIAGNOSTIC DATA (LABS, IMAGING, TESTING) - I reviewed patient records, labs, notes, testing and  imaging myself where available.  Lab Results  Component Value Date   WBC 5.1 01/04/2022   HGB 11.7 (L) 01/04/2022   HCT 36.0 01/04/2022   MCV 87.4 01/04/2022   PLT 248 01/04/2022      Component Value Date/Time   NA 142 01/04/2022 1420   NA 146 (H) 12/14/2018 1017   K 3.6 01/04/2022 1420   CL 107 01/04/2022 1420   CO2 29 01/04/2022 1420   GLUCOSE 95 01/04/2022 1420   BUN 11 01/04/2022 1420   BUN 9 12/14/2018 1017   CREATININE 0.67 01/04/2022 1420  CALCIUM 9.6 01/04/2022 1420   PROT 6.9 01/04/2022 1420   PROT 7.0 12/14/2018 1017   ALBUMIN 4.4 01/04/2022 1420   ALBUMIN 4.6 12/14/2018 1017   AST 22 01/04/2022 1420   ALT 15 01/04/2022 1420   ALKPHOS 82 01/04/2022 1420   BILITOT 0.7 01/04/2022 1420   GFRNONAA >60 01/04/2022 1420   GFRAA 92 12/14/2018 1017    Lab Results  Component Value Date   QJFHLKTG25 638 04/02/2021   Lab Results  Component Value Date   TSH 1.721 03/24/2021      ASSESSMENT AND PLAN 70 y.o. year old female  has a past medical history of Dyslipidemia, Lymphoma ([place]) (03/2021), and Seizures ([place]). here with:  1.  Seizures  Continue Lamictal extended release 300 mg daily Advised if she has any seizure event she should let us know Follow-up in 1 year or sooner if needed  Ward Givens, MSN, NP-C 01/20/2022, 3:31 PM [facility] [address], [place], [place] [address] [phone]

## 2022-01-23 LAB — LAMOTRIGINE LEVEL: Lamotrigine Lvl: 15.6 ug/mL (ref 2.0–20.0)

## 2022-03-08 ENCOUNTER — Encounter: Payer: Self-pay | Admitting: *Deleted

## 2022-03-16 ENCOUNTER — Encounter: Payer: Self-pay | Admitting: Physician Assistant

## 2022-03-16 ENCOUNTER — Ambulatory Visit (INDEPENDENT_AMBULATORY_CARE_PROVIDER_SITE_OTHER): Payer: Medicare Other | Admitting: Physician Assistant

## 2022-03-16 VITALS — BP 130/68 | HR 84 | Temp 97.7°F | Ht 66.0 in | Wt 164.0 lb

## 2022-03-16 DIAGNOSIS — T63421A Toxic effect of venom of ants, accidental (unintentional), initial encounter: Secondary | ICD-10-CM | POA: Diagnosis not present

## 2022-03-16 DIAGNOSIS — S90521A Blister (nonthermal), right ankle, initial encounter: Secondary | ICD-10-CM

## 2022-03-16 DIAGNOSIS — S90522A Blister (nonthermal), left ankle, initial encounter: Secondary | ICD-10-CM | POA: Diagnosis not present

## 2022-03-16 DIAGNOSIS — T63421S Toxic effect of venom of ants, accidental (unintentional), sequela: Secondary | ICD-10-CM

## 2022-03-16 DIAGNOSIS — Z23 Encounter for immunization: Secondary | ICD-10-CM

## 2022-03-16 NOTE — Progress Notes (Signed)
Katie Mcneil is a 71 y.o. female here for a follow up of a pre-existing problem.  History of Present Illness:   Chief Complaint  Patient presents with   Follow-up    Pt here for f/u from the ED, was seen on 03/14/2022 for Fire ant bites. Right ankles has two large fluid filled blisters, left has one small blister.    HPI  Patient is here with her daughter.   Fire [employer] Bites The patient was seen at the ED on 03/14/2022 for fire ant bites on both of her ankles. There were blisters on both ankles, with larger blister on her right ankle. She was prescribed 1% Silvadene cream at the ED.   She reports that she got bit on 03/12/2022, but went to the ED on 03/14/2022. She reports that the Silvadene cream is not helping that much with the bites. She reports that it is painful when she gets up. Her legs are less swollen since ED visit.   Denies: fevers, chills, severe pain  Past Medical History:  Diagnosis Date   Dyslipidemia    Lymphoma ([place]) 03/2021   Seizures ([place])      Social History   Tobacco Use   Smoking status: Never   Smokeless tobacco: Never  Vaping Use   Vaping Use: Never used  Substance Use Topics   Alcohol use: Not Currently    Alcohol/week: 0.0 standard drinks of alcohol   Drug use: No    Past Surgical History:  Procedure Laterality Date   CATARACT EXTRACTION W/ INTRAOCULAR LENS IMPLANT Bilateral 2019   CESAREAN SECTION  1981, 1990   NO PAST SURGERIES     TUBAL LIGATION      Family History  Problem Relation Age of Onset   Neuropathy Mother    Anuerysm Father    Leukemia Sister 76   Alzheimer's disease Sister 85   Dementia Brother    Heart attack Maternal Grandmother    CVA Maternal Grandfather    Dementia Paternal Grandmother    Seizures Daughter    Other Daughter        Psychological disorder   Colon cancer Neg Hx     No Known Allergies  Current Medications:   Current Outpatient Medications:    acetaminophen (TYLENOL) 500 MG tablet, Take 1  tablet (500 mg total) by mouth every 8 (eight) hours as needed for moderate pain or fever., Disp: 25 tablet, Rfl: 0   cetirizine (ZYRTEC) 10 MG tablet, Take 10 mg by mouth daily., Disp: , Rfl:    LamoTRIgine 300 MG TB24 24 hour tablet, Take 1 tablet (300 mg total) by mouth daily., Disp: 90 tablet, Rfl: 3   Omega-3 Fatty Acids (FISH OIL) 1000 MG CAPS, Take 1 capsule by mouth daily in the afternoon., Disp: , Rfl:    pyridOXINE (VITAMIN B-6) 50 MG tablet, Take 50 mg by mouth daily., Disp: , Rfl:    Vitamin D, Cholecalciferol, 25 MCG (1000 UT) CAPS, Take 1 capsule by mouth daily in the afternoon., Disp: , Rfl:    Review of Systems:   Review of Systems  Musculoskeletal:        (+) Bites on both ankles. Right ankle has larger blister than left ankle.     Vitals:   Vitals:   03/16/22 1402  BP: 130/68  Pulse: 84  Temp: 97.7 F (36.5 C)  TempSrc: Temporal  SpO2: 97%  Weight: 164 lb (74.4 kg)  Height: '5\' 6"'$  (1.676 m)     Body  mass index is 26.47 kg/m.  Physical Exam:   Physical Exam Constitutional:      General: She is not in acute distress.    Appearance: Normal appearance. She is not ill-appearing.  HENT:     Head: Normocephalic and atraumatic.     Right Ear: External ear normal.     Left Ear: External ear normal.  Eyes:     Extraocular Movements: Extraocular movements intact.     Pupils: Pupils are equal, round, and reactive to light.  Cardiovascular:     Rate and Rhythm: Normal rate and regular rhythm.     Heart sounds: Normal heart sounds. No murmur heard.    No gallop.  Pulmonary:     Effort: No respiratory distress.     Breath sounds: Normal breath sounds. No wheezing or rales.  Skin:    General: Skin is warm and dry.     Comments: R ankle: 2.5 cm, 1 cm, and x 0.5 cm fluid filled blister at posterior ankle with slight surrounding erythema   L ankle: 1 cm fluid filled blister at posterior ankle   No significant swelling in b/l ankles  No warmth   Neurological:      Mental Status: She is alert and oriented to person, place, and time.  Psychiatric:        Judgment: Judgment normal.         Assessment and Plan:   Fire ant bite, accidental or unintentional, sequela No red flags No evidence of systemic infection Discussed better application of silvadene and compression for healing Follow-up if new/worsening sx  Need for immunization against influenza UTD today   I,Param Shah,acting as a scribe for [employer], PA.,have documented all relevant documentation on the behalf of [name], PA,as directed by  [name], PA while in the presence of [name], [place].   [name], PA-C

## 2022-03-16 NOTE — Patient Instructions (Addendum)
It was great to see you!  The blisters will pop on their own  Wash with plain soap and water  Keep area bandaged and apply slivadene 1-2 times daily  Follow-up if any concerns  Take care,  [name] PA-C

## 2022-05-10 ENCOUNTER — Ambulatory Visit (INDEPENDENT_AMBULATORY_CARE_PROVIDER_SITE_OTHER): Payer: Medicare Other

## 2022-05-10 VITALS — Wt 164.0 lb

## 2022-05-10 DIAGNOSIS — Z0000 Encounter for general adult medical examination without abnormal findings: Secondary | ICD-10-CM

## 2022-05-10 NOTE — Progress Notes (Signed)
I connected with  Arlester Marker on 05/10/22 by a audio enabled telemedicine application and verified that I am speaking with the correct person using two identifiers.  Patient Location: Home  Provider Location: Office/Clinic  I discussed the limitations of evaluation and management by telemedicine. The patient expressed understanding and agreed to proceed.   Subjective:   Katie Mcneil is a 71 y.o. female who presents for an Initial Medicare Annual Wellness Visit.  Review of Systems     Cardiac Risk Factors include: advanced age (>24mn, >>26women)     Objective:    Today's Vitals   05/10/22 1459  Weight: 164 lb (74.4 kg)   Body mass index is 26.47 kg/m.     05/10/2022    3:04 PM 05/11/2021    7:26 AM 03/24/2021    6:02 PM 03/23/2021    3:22 PM 03/14/2021    9:32 PM  Advanced Directives  Does Patient Have a Medical Advance Directive? Yes Yes Yes Yes No;Yes  Type of [place] will Living will Living will Living will  Does patient want to make changes to medical advance directive?  No - Patient declined No - Patient declined    Copy of HH[place]oin Chart? No - copy requested No - copy requested       Current Medications (verified) Outpatient Encounter Medications as of 05/10/2022  Medication Sig   B Complex-C (VITAMIN B + C COMPLEX) TABS    cetirizine (ZYRTEC) 10 MG tablet Take 10 mg by mouth daily.   LamoTRIgine 300 MG TB24 24 hour tablet Take 1 tablet (300 mg total) by mouth daily.   Omega-3 Fatty Acids (FISH OIL) 1000 MG CAPS Take 1 capsule by mouth daily in the afternoon.   Vitamin D, Cholecalciferol, 25 MCG (1000 UT) CAPS Take 1 capsule by mouth daily in the afternoon.   [DISCONTINUED] pyridOXINE (VITAMIN B-6) 50 MG tablet Take 50 mg by mouth daily.   No facility-administered encounter medications on file as of 05/10/2022.    Allergies (verified) Patient  has no known allergies.   History: Past Medical History:  Diagnosis Date   Dyslipidemia    Lymphoma ([place] 03/20[place]   Seizures ([place]    Past Surgical History:  Procedure Laterality Date   CATARACT EXTRACTION W/ INTRAOCULAR LENS IMPLANT Bilateral 2019   CESAREAN SECTION  1981, 1990   NO PAST SURGERIES     TUBAL LIGATION     Family History  Problem Relation Age of Onset   Neuropathy Mother    Anuerysm Father    Leukemia Sister 673  Alzheimer's disease Sister 660  Dementia Brother    Heart attack Maternal Grandmother    CVA Maternal Grandfather    Dementia Paternal Grandmother    Seizures Daughter    Other Daughter        Psychological disorder   Colon cancer Neg Hx    Social History   Socioeconomic History   Marital status: Widowed    Spouse name: Not on file   Number of children: 2   Years of education: Not on file   Highest education level: Not on file  Occupational History   Not on file  Tobacco Use   Smoking status: Never   Smokeless tobacco: Never  Vaping Use   Vaping Use: Never used  Substance and Sexual Activity   Alcohol use: Not Currently    Alcohol/week: 0.0 standard drinks of alcohol  Drug use: No   Sexual activity: Not Currently  Other Topics Concern   Not on file  Social History Narrative   Retired from working in Engineer, 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Civil engineer, contracting   Lives with daughter (who has mental health issues)   Has two children   Widowed   Social Determinants of Health   Financial Resource Strain: Low Risk  (05/10/2022)   Overall Financial Resource Strain (CARDIA)    Difficulty of Paying Living Expenses: Not hard at all  Food Insecurity: No Food Insecurity (05/10/2022)   Hunger Vital Sign    Worried About Running Out of Food in the Last Year: Never true    [place] in the Last Year: Never true  Transportation Needs: No Transportation Needs (05/10/2022)   PRAPARE - Hydrologist (Medical): No    Lack of Transportation  (Non-Medical): No  Physical Activity: Insufficiently Active (05/10/2022)   Exercise Vital Sign    Days of Exercise per Week: 3 days    Minutes of Exercise per Session: 30 min  Stress: No Stress Concern Present (05/10/2022)   [place]    Feeling of Stress : Not at all  Social Connections: Moderately Integrated (05/10/2022)   Social Connection and Isolation Panel [NHANES]    Frequency of Communication with Friends and Family: More than three times a week    Frequency of Social Gatherings with Friends and Family: More than three times a week    Attends Religious Services: More than 4 times per year    Active Member of [employer] or Organizations: Yes    Attends Archivist Meetings: 1 to 4 times per year    Marital Status: Widowed    Tobacco Counseling Counseling given: Not Answered   Clinical Intake:  Pre-visit preparation completed: Yes  Pain : No/denies pain     BMI - recorded: 26.47 Nutritional Status: BMI 25 -29 Overweight Nutritional Risks: None Diabetes: No  How often do you need to have someone help you when you read instructions, pamphlets, or other written materials from your doctor or pharmacy?: 1 - Never  Diabetic?no  Interpreter Needed?: No  Information entered by :: Charlott Rakes, LPN   Activities of Daily Living    05/10/2022    3:07 PM  In your present state of health, do you have any difficulty performing the following activities:  Hearing? 0  Vision? 0  Difficulty concentrating or making decisions? 0  Walking or climbing stairs? 0  Dressing or bathing? 0  Doing errands, shopping? 0  Preparing Food and eating ? N  Using the Toilet? N  In the past six months, have you accidently leaked urine? N  Do you have problems with loss of bowel control? N  Managing your Medications? N  Managing your Finances? N  Housekeeping or managing your Housekeeping? N    Patient Care  Team: [name], [place] as PCP - General (Physician Assistant)  Indicate any recent Medical Services you may have received from other than [facility] providers in the past year (date may be approximate).     Assessment:   This is a routine wellness examination for [place].  Hearing/Vision screen Hearing Screening - Comments:: Pt denies any hearing issues  Vision Screening - Comments:: Pt follows up with Dr Macarthur Critchley for annual eye exams  Dietary issues and exercise activities discussed: Current Exercise Habits: Home exercise routine, Type of exercise: walking, Time (Minutes): 30,  Frequency (Times/Week): 3, Weekly Exercise (Minutes/Week): 90   Goals Addressed             This Visit's Progress    Patient Stated       Exercise        Depression Screen    05/10/2022    3:02 PM 12/03/2021    9:04 AM 09/10/2020    9:03 AM  PHQ 2/9 Scores  PHQ - 2 Score 0 0 0    Fall Risk    05/10/2022    3:05 PM 12/03/2021    9:03 AM  [place] in the past year? 0 1  Number falls in past yr: 0 0  Injury with Fall? 0 1  Risk for fall due to : Impaired vision History of fall(s);No Fall Risks  Follow up Falls prevention discussed Falls evaluation completed    FALL RISK PREVENTION PERTAINING TO THE HOME:  Any stairs in or around the home? Yes  If so, are there any without handrails? No  Home free of loose throw rugs in walkways, pet beds, electrical cords, etc? Yes  Adequate lighting in your home to reduce risk of falls? Yes   ASSISTIVE DEVICES UTILIZED TO PREVENT FALLS:  Life alert? Yes apple watch  Use of a cane, walker or w/c? No  Grab bars in the bathroom? No  Shower chair or bench in shower? No  Elevated toilet seat or a handicapped toilet? No   TIMED UP AND GO:  Was the test performed? No .   Cognitive Function:    09/11/2014    3:32 PM  MMSE - Mini Mental State Exam  Orientation to time 5  Orientation to Place 5  Registration 3  Attention/ Calculation 4   Recall 3  Language- name 2 objects 2  Language- repeat 1  Language- follow 3 step command 3  Language- read & follow direction 1  Write a sentence 1  Copy design 1  Total score 29      02/27/2021    9:00 AM  Montreal Cognitive Assessment   Visuospatial/ Executive (0/5) 2  Naming (0/3) 3  Attention: Read list of digits (0/2) 2  Attention: Read list of letters (0/1) 1  Attention: Serial 7 subtraction starting at 100 (0/3) 3  Language: Repeat phrase (0/2) 1  Language : Fluency (0/1) 0  Abstraction (0/2) 2  Delayed Recall (0/5) 4  Orientation (0/6) 6  Total 24  Adjusted Score (based on education) 24      05/10/2022    3:08 PM  6CIT Screen  What Year? 0 points  What month? 0 points  What time? 0 points  Count back from 20 0 points  Months in reverse 0 points  Repeat phrase 0 points  Total Score 0 points    Immunizations Immunization History  Administered Date(s) Administered   Fluad Quad(high Dose 65+) 03/17/2021, 03/16/2022   Moderna Covid-19 Vaccine Bivalent Booster 41yr & up 03/09/2021   PFIZER(Purple Top)SARS-COV-2 Vaccination 07/30/2019, 08/27/2019   Pneumococcal Conjugate-13 04/07/2017   Pneumococcal Polysaccharide-23 09/10/2020   Tdap 09/10/2020    TDAP status: Up to date  Flu Vaccine status: Up to date  Pneumococcal vaccine status: Up to date  Covid-19 vaccine status: Completed vaccines  Qualifies for Shingles Vaccine? Yes   Zostavax completed No   Shingrix Completed?: No.    Education has been provided regarding the importance of this vaccine. Patient has been advised to call insurance company to determine out of pocket  expense if they have not yet received this vaccine. Advised may also receive vaccine at local pharmacy or Health Dept. Verbalized acceptance and understanding.  Screening Tests Health Maintenance  Topic Date Due   MAMMOGRAM  06/22/2022 (Originally 02/12/2021)   COLONOSCOPY (Pts 45-66yr Insurance coverage will need to be confirmed)   06/22/2022 (Originally 06/03/2021)   Zoster Vaccines- Shingrix (1 of 2) 03/17/2023 (Originally 12/11/1969)   Medicare Annual Wellness (AWV)  05/11/2023   Pneumonia Vaccine 70 Years old  Completed   INFLUENZA VACCINE  Completed   DEXA SCAN  Completed   Hepatitis C Screening  Completed   HPV VACCINES  Aged Out   COVID-19 Vaccine  Discontinued    Health Maintenance  There are no preventive care reminders to display for this patient.   Colorectal cancer screening: Type of screening: Colonoscopy. Completed 06/04/11. Repeat every 10 years  Mammogram status: Completed per pt this year 2023. Repeat every year  Bone Density status: Completed 10/26/18. Results reflect: Bone density results: OSTEOPENIA. Repeat every 2 years.  Additional Screening:  Hepatitis C Screening:  Completed 04/02/21  Vision Screening: Recommended annual ophthalmology exams for early detection of glaucoma and other disorders of the eye. Is the patient up to date with their annual eye exam?  Yes  Who is the provider or what is the name of the office in which the patient attends annual eye exams? Dr JMacarthur CritchleyIf pt is not established with a provider, would they like to be referred to a provider to establish care? No .   Dental Screening: Recommended annual dental exams for proper oral hygiene  Community Resource Referral / Chronic Care Management: CRR required this visit?  No   CCM required this visit?  No      Plan:     I have personally reviewed and noted the following in the patient's chart:   Medical and social history Use of alcohol, tobacco or illicit drugs  Current medications and supplements including opioid prescriptions. Patient is not currently taking opioid prescriptions. Functional ability and status Nutritional status Physical activity Advanced directives List of other physicians Hospitalizations, surgeries, and ER visits in previous 12 months Vitals Screenings to include cognitive,  depression, and falls Referrals and appointments  In addition, I have reviewed and discussed with patient certain preventive protocols, quality metrics, and best practice recommendations. A written personalized care plan for preventive services as well as general preventive health recommendations were provided to patient.     TWillette Brace LPN   [id number]  Nurse Notes: none

## 2022-05-10 NOTE — Patient Instructions (Signed)
Katie Mcneil , Thank you for taking time to come for your Medicare Wellness Visit. I appreciate your ongoing commitment to your health goals. Please review the following plan we discussed and let me know if I can assist you in the future.   These are the goals we discussed:  Goals      Patient Stated     Exercise         This is a list of the screening recommended for you and due dates:  Health Maintenance  Topic Date Due   Mammogram  06/22/2022*   Colon Cancer Screening  06/22/2022*   Zoster (Shingles) Vaccine (1 of 2) 03/17/2023*   Medicare Annual Wellness Visit  05/11/2023   Pneumonia Vaccine  Completed   Flu Shot  Completed   DEXA scan (bone density measurement)  Completed   Hepatitis C Screening: USPSTF Recommendation to screen - Ages 55-79 yo.  Completed   HPV Vaccine  Aged Out   COVID-19 Vaccine  Discontinued  *Topic was postponed. The date shown is not the original due date.    Advanced directives: Please bring a copy of your health care power of attorney and living will to the office at your convenience.  Conditions/risks identified: more exercise   Next appointment: Follow up in one year for your annual wellness visit    Preventive Care 65 Years and Older, Female Preventive care refers to lifestyle choices and visits with your health care provider that can promote health and wellness. What does preventive care include? A yearly physical exam. This is also called an annual well check. Dental exams once or twice a year. Routine eye exams. Ask your health care provider how often you should have your eyes checked. Personal lifestyle choices, including: Daily care of your teeth and gums. Regular physical activity. Eating a healthy diet. Avoiding tobacco and drug use. Limiting alcohol use. Practicing safe sex. Taking low-dose aspirin every day. Taking vitamin and mineral supplements as recommended by your health care provider. What happens during an annual well  check? The services and screenings done by your health care provider during your annual well check will depend on your age, overall health, lifestyle risk factors, and family history of disease. Counseling  Your health care provider may ask you questions about your: Alcohol use. Tobacco use. Drug use. Emotional well-being. Home and relationship well-being. Sexual activity. Eating habits. History of falls. Memory and ability to understand (cognition). Work and work Statistician. Reproductive health. Screening  You may have the following tests or measurements: Height, weight, and BMI. Blood pressure. Lipid and cholesterol levels. These may be checked every 5 years, or more frequently if you are over 3 years old. Skin check. Lung cancer screening. You may have this screening every year starting at age 21 if you have a 30-pack-year history of smoking and currently smoke or have quit within the past 15 years. Fecal occult blood test (FOBT) of the stool. You may have this test every year starting at age 23. Flexible sigmoidoscopy or colonoscopy. You may have a sigmoidoscopy every 5 years or a colonoscopy every 10 years starting at age 24. Hepatitis C blood test. Hepatitis B blood test. Sexually transmitted disease (STD) testing. Diabetes screening. This is done by checking your blood sugar (glucose) after you have not eaten for a while (fasting). You may have this done every 1-3 years. Bone density scan. This is done to screen for osteoporosis. You may have this done starting at age 54. Mammogram. This may  be done every 1-2 years. Talk to your health care provider about how often you should have regular mammograms. Talk with your health care provider about your test results, treatment options, and if necessary, the need for more tests. Vaccines  Your health care provider may recommend certain vaccines, such as: Influenza vaccine. This is recommended every year. Tetanus, diphtheria, and  acellular pertussis (Tdap, Td) vaccine. You may need a Td booster every 10 years. Zoster vaccine. You may need this after age 6. Pneumococcal 13-valent conjugate (PCV13) vaccine. One dose is recommended after age 72. Pneumococcal polysaccharide (PPSV23) vaccine. One dose is recommended after age 54. Talk to your health care provider about which screenings and vaccines you need and how often you need them. This information is not intended to replace advice given to you by your health care provider. Make sure you discuss any questions you have with your health care provider. Document Released: 06/27/2015 Document Revised: 02/18/2016 Document Reviewed: 04/01/2015 Elsevier Interactive Patient Education  2017 [place] Prevention in the Home Falls can cause injuries. They can happen to people of all ages. There are many things you can do to make your home safe and to help prevent falls. What can I do on the outside of my home? Regularly fix the edges of walkways and driveways and fix any cracks. Remove anything that might make you trip as you walk through a door, such as a raised step or threshold. Trim any bushes or trees on the path to your home. Use bright outdoor lighting. Clear any walking paths of anything that might make someone trip, such as rocks or tools. Regularly check to see if handrails are loose or broken. Make sure that both sides of any steps have handrails. Any raised decks and porches should have guardrails on the edges. Have any leaves, snow, or ice cleared regularly. Use sand or salt on walking paths during winter. Clean up any spills in your garage right away. This includes oil or grease spills. What can I do in the bathroom? Use night lights. Install grab bars by the toilet and in the tub and shower. Do not use towel bars as grab bars. Use non-skid mats or decals in the tub or shower. If you need to sit down in the shower, use a plastic, non-slip stool. Keep  the floor dry. Clean up any water that spills on the floor as soon as it happens. Remove soap buildup in the tub or shower regularly. Attach bath mats securely with double-sided non-slip rug tape. Do not have throw rugs and other things on the floor that can make you trip. What can I do in the bedroom? Use night lights. Make sure that you have a light by your bed that is easy to reach. Do not use any sheets or blankets that are too big for your bed. They should not hang down onto the floor. Have a firm chair that has side arms. You can use this for support while you get dressed. Do not have throw rugs and other things on the floor that can make you trip. What can I do in the kitchen? Clean up any spills right away. Avoid walking on wet floors. Keep items that you use a lot in easy-to-reach places. If you need to reach something above you, use a strong step stool that has a grab bar. Keep electrical cords out of the way. Do not use floor polish or wax that makes floors slippery. If you must use  wax, use non-skid floor wax. Do not have throw rugs and other things on the floor that can make you trip. What can I do with my stairs? Do not leave any items on the stairs. Make sure that there are handrails on both sides of the stairs and use them. Fix handrails that are broken or loose. Make sure that handrails are as long as the stairways. Check any carpeting to make sure that it is firmly attached to the stairs. Fix any carpet that is loose or worn. Avoid having throw rugs at the top or bottom of the stairs. If you do have throw rugs, attach them to the floor with carpet tape. Make sure that you have a light switch at the top of the stairs and the bottom of the stairs. If you do not have them, ask someone to add them for you. What else can I do to help prevent falls? Wear shoes that: Do not have high heels. Have rubber bottoms. Are comfortable and fit you well. Are closed at the toe. Do not  wear sandals. If you use a stepladder: Make sure that it is fully opened. Do not climb a closed stepladder. Make sure that both sides of the stepladder are locked into place. Ask someone to hold it for you, if possible. Clearly mark and make sure that you can see: Any grab bars or handrails. First and last steps. Where the edge of each step is. Use tools that help you move around (mobility aids) if they are needed. These include: Canes. Walkers. Scooters. Crutches. Turn on the lights when you go into a dark area. Replace any light bulbs as soon as they burn out. Set up your furniture so you have a clear path. Avoid moving your furniture around. If any of your floors are uneven, fix them. If there are any pets around you, be aware of where they are. Review your medicines with your doctor. Some medicines can make you feel dizzy. This can increase your chance of falling. Ask your doctor what other things that you can do to help prevent falls. This information is not intended to replace advice given to you by your health care provider. Make sure you discuss any questions you have with your health care provider. Document Released: 03/27/2009 Document Revised: 11/06/2015 Document Reviewed: 07/05/2014 Elsevier Interactive Patient Education  2017 [employer].

## 2022-05-14 ENCOUNTER — Ambulatory Visit (INDEPENDENT_AMBULATORY_CARE_PROVIDER_SITE_OTHER): Payer: Medicare Other | Admitting: Internal Medicine

## 2022-05-14 ENCOUNTER — Encounter (HOSPITAL_BASED_OUTPATIENT_CLINIC_OR_DEPARTMENT_OTHER): Payer: Self-pay | Admitting: Internal Medicine

## 2022-05-14 VITALS — BP 108/72 | HR 72 | Ht 64.0 in | Wt 169.0 lb

## 2022-05-14 DIAGNOSIS — I2510 Atherosclerotic heart disease of native coronary artery without angina pectoris: Secondary | ICD-10-CM | POA: Diagnosis not present

## 2022-05-14 DIAGNOSIS — E7801 Familial hypercholesterolemia: Secondary | ICD-10-CM

## 2022-05-14 MED ORDER — ATORVASTATIN CALCIUM 40 MG PO TABS: 40.0000 mg | ORAL_TABLET | Freq: Every day | ORAL | 3 refills | Status: DC

## 2022-05-14 NOTE — Progress Notes (Signed)
LIPID CLINIC CONSULT NOTE  Chief Complaint:  Manage dyslipidemia  Primary Care Physician: [name], PA  Primary Cardiologist:  None  HPI:  Katie Mcneil is a 71 y.o. female who is being seen today for the evaluation of dyslipidemia at the request of [name], [place]. This is a pleasant 71 year old female kindly referred for evaluation management of dyslipidemia.  She has lost had a history of high cholesterol but has not been on therapy.  She reports her mother had high cholesterol as well but suffered from neuropathy and had side effects related to statins therefore she has been hesitant to take them.  Most recently her total cholesterol is 292, triglycerides 107, HDL 68 and LDL 203.  She did have a CT pulmonary angiogram performed in the ER recently which showed some coronary artery calcification and aortic atherosclerosis suggestive that there is evidence for ASCVD.  PMHx:  Past Medical History:  Diagnosis Date   Dyslipidemia    Lymphoma ([place]) 03/2021   Seizures ([place])     Past Surgical History:  Procedure Laterality Date   CATARACT EXTRACTION W/ INTRAOCULAR LENS IMPLANT Bilateral 2019   CESAREAN SECTION  1981, 1990   NO PAST SURGERIES     TUBAL LIGATION      FAMHx:  Family History  Problem Relation Age of Onset   Neuropathy Mother    Anuerysm Father    Leukemia Sister 57   Alzheimer's disease Sister 59   Dementia Brother    Heart attack Maternal Grandmother    CVA Maternal Grandfather    Dementia Paternal Grandmother    Seizures Daughter    Other Daughter        Psychological disorder   Colon cancer Neg Hx     SOCHx:   reports that she has never smoked. She has never used smokeless tobacco. She reports that she does not currently use alcohol. She reports that she does not use drugs.  ALLERGIES:  No Known Allergies  ROS: Pertinent items noted in HPI and remainder of comprehensive ROS otherwise negative.  HOME MEDS: Current Outpatient  Medications on File Prior to Visit  Medication Sig Dispense Refill   B Complex-C (VITAMIN B + C COMPLEX) TABS      cetirizine (ZYRTEC) 10 MG tablet Take 10 mg by mouth daily.     LamoTRIgine 300 MG TB24 24 hour tablet Take 1 tablet (300 mg total) by mouth daily. 90 tablet 3   Omega-3 Fatty Acids (FISH OIL) 1000 MG CAPS Take 1 capsule by mouth daily in the afternoon.     Vitamin D, Cholecalciferol, 25 MCG (1000 UT) CAPS Take 1 capsule by mouth daily in the afternoon.     No current facility-administered medications on file prior to visit.    LABS/IMAGING: No results found for this or any previous visit (from the past 48 hour(s)). No results found.  LIPID PANEL:    Component Value Date/Time   CHOL 292 (H) 12/03/2021 0927   TRIG 107.0 12/03/2021 0927   HDL 68.10 12/03/2021 0927   CHOLHDL 4 12/03/2021 0927   VLDL 21.4 12/03/2021 0927   LDLCALC 203 (H) 12/03/2021 0927    WEIGHTS: Wt Readings from Last 3 Encounters:  05/14/22 169 lb (76.7 kg)  05/10/22 164 lb (74.4 kg)  03/16/22 164 lb (74.4 kg)    VITALS: BP 108/72 (BP Location: Left Arm, Patient Position: Sitting, Cuff Size: Normal)   Pulse 72   Ht '5\' 4"'$  (1.626 m)   Wt 169  lb (76.7 kg)   BMI 29.01 kg/m   EXAM: Deferred  EKG: Deferred  ASSESSMENT: Probable familial hyperlipidemia, LDL greater than 190 Family history of high cholesterol and her mother Coronary artery calcification and aortic atherosclerosis  PLAN: 1.   Ms. Kennon Holter has a probable familial hyperlipidemia.  She has been hesitant to take statins but does have coronary artery calcification and aortic atherosclerosis.  I agree with her primary care provider recommendations to start atorvastatin 40 mg daily.  If she has side effects we could consider alternative statin therapy or PCSK9 inhibitor.  I would like to assess an LP(a) which would be impactful if it was elevated.  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  She is on lamotrigine, but no interaction with atorvastatin is noted.  Will plan to repeat lipid profile in about 3 to 4 months on therapy.  Plan follow-up with me afterwards.  Thanks again for the kind referral.  Pixie Casino, MD, FACC, [place] Director of the [facility] Lipid [facility] &  Cardiovascular Risk Reduction Clinic Diplomate of the American Board of Clinical Lipidology Attending Cardiologist  Direct Dial: [phone]  Fax: [phone]  Website:  [email or web address] Limon 05/14/2022, 12:41 PM

## 2022-05-14 NOTE — Patient Instructions (Addendum)
Medication Instructions:  START atorvastatin '40mg'$  daily -- if more cost-effective with mail order pharmacy, please let us know.   *If you need a refill on your cardiac medications before your next appointment, please call your pharmacy*   Lab Work: FASTING NMR Lipoprofile in 3-4 months   If you have labs (blood work) drawn today and your tests are completely normal, you will receive your results only by: [place] (if you have MyChart) OR A paper copy in the mail If you have any lab test that is abnormal or we need to change your treatment, we will call you to review the results.  Follow-Up: At [facility], you and your health needs are our priority.  As part of our continuing mission to provide you with exceptional heart care, we have created designated Provider Care Teams.  These Care Teams include your primary Cardiologist (physician) and Advanced Practice Providers (APPs -  Physician Assistants and Nurse Practitioners) who all work together to provide you with the care you need, when you need it.  We recommend signing up for the patient portal called "MyChart".  Sign up information is provided on this After Visit Summary.  MyChart is used to connect with patients for Virtual Visits (Telemedicine).  Patients are able to view lab/test results, encounter notes, upcoming appointments, etc.  Non-urgent messages can be sent to your provider as well.   To learn more about what you can do with MyChart, go to [email or web address].    Your next appointment:    3-4 months with Dr. Debara Pickett -- lipid clinic

## 2022-05-18 LAB — LIPOPROTEIN A (LPA): Lipoprotein (a): 286.8 nmol/L — ABNORMAL HIGH (ref <75.0)

## 2022-06-21 ENCOUNTER — Ambulatory Visit (HOSPITAL_COMMUNITY)
Admission: RE | Admit: 2022-06-21 | Discharge: 2022-06-21 | Disposition: A | Discharge status: Home / Self Care | Payer: Medicare Other | Source: Ambulatory Visit | Attending: Hematology | Admitting: Hematology

## 2022-06-21 DIAGNOSIS — C8298 Follicular lymphoma, unspecified, lymph nodes of multiple sites: Secondary | ICD-10-CM

## 2022-06-21 MED ORDER — SODIUM CHLORIDE (PF) 0.9 % IJ SOLN
INTRAMUSCULAR | Status: AC
  Filled 2022-06-21: qty 50

## 2022-06-21 MED ORDER — IOHEXOL 300 MG/ML  SOLN
100.0000 mL | Freq: Once | INTRAMUSCULAR | Status: AC | PRN
  Administered 2022-06-21: 100 mL via INTRAVENOUS

## 2022-06-25 ENCOUNTER — Other Ambulatory Visit: Payer: Self-pay | Admitting: *Deleted

## 2022-06-25 DIAGNOSIS — C8298 Follicular lymphoma, unspecified, lymph nodes of multiple sites: Secondary | ICD-10-CM

## 2022-06-28 ENCOUNTER — Other Ambulatory Visit: Payer: Self-pay

## 2022-06-28 ENCOUNTER — Inpatient Hospital Stay: Payer: Medicare Other | Attending: Hematology

## 2022-06-28 ENCOUNTER — Inpatient Hospital Stay (HOSPITAL_BASED_OUTPATIENT_CLINIC_OR_DEPARTMENT_OTHER): Payer: Medicare Other | Admitting: Hematology

## 2022-06-28 VITALS — BP 118/63 | HR 74 | Temp 98.0°F | Resp 18 | Ht 64.0 in | Wt 168.5 lb

## 2022-06-28 DIAGNOSIS — K5900 Constipation, unspecified: Secondary | ICD-10-CM | POA: Insufficient documentation

## 2022-06-28 DIAGNOSIS — Z79899 Other long term (current) drug therapy: Secondary | ICD-10-CM | POA: Diagnosis not present

## 2022-06-28 DIAGNOSIS — C8298 Follicular lymphoma, unspecified, lymph nodes of multiple sites: Secondary | ICD-10-CM

## 2022-06-28 DIAGNOSIS — C8208 Follicular lymphoma grade I, lymph nodes of multiple sites: Secondary | ICD-10-CM | POA: Insufficient documentation

## 2022-06-28 DIAGNOSIS — E785 Hyperlipidemia, unspecified: Secondary | ICD-10-CM | POA: Diagnosis not present

## 2022-06-28 DIAGNOSIS — R5383 Other fatigue: Secondary | ICD-10-CM | POA: Diagnosis not present

## 2022-06-28 DIAGNOSIS — G40909 Epilepsy, unspecified, not intractable, without status epilepticus: Secondary | ICD-10-CM | POA: Insufficient documentation

## 2022-06-28 DIAGNOSIS — I700 Atherosclerosis of aorta: Secondary | ICD-10-CM | POA: Diagnosis not present

## 2022-06-28 DIAGNOSIS — Z806 Family history of leukemia: Secondary | ICD-10-CM | POA: Diagnosis not present

## 2022-06-28 LAB — CBC WITH DIFFERENTIAL (CANCER CENTER ONLY)
Abs Immature Granulocytes: 0.01 10*3/uL (ref 0.00–0.07)
Basophils Absolute: 0 10*3/uL (ref 0.0–0.1)
Basophils Relative: 1 %
Eosinophils Absolute: 0.3 10*3/uL (ref 0.0–0.5)
Eosinophils Relative: 5 %
HCT: 35.4 % — ABNORMAL LOW (ref 36.0–46.0)
Hemoglobin: 11.5 g/dL — ABNORMAL LOW (ref 12.0–15.0)
Immature Granulocytes: 0 %
Lymphocytes Relative: 45 %
Lymphs Abs: 2.6 10*3/uL (ref 0.7–4.0)
MCH: 28.6 pg (ref 26.0–34.0)
MCHC: 32.5 g/dL (ref 30.0–36.0)
MCV: 88.1 fL (ref 80.0–100.0)
Monocytes Absolute: 0.6 10*3/uL (ref 0.1–1.0)
Monocytes Relative: 10 %
Neutro Abs: 2.3 10*3/uL (ref 1.7–7.7)
Neutrophils Relative %: 39 %
Platelet Count: 216 10*3/uL (ref 150–400)
RBC: 4.02 MIL/uL (ref 3.87–5.11)
RDW: 13.8 % (ref 11.5–15.5)
WBC Count: 5.8 10*3/uL (ref 4.0–10.5)
nRBC: 0 % (ref 0.0–0.2)

## 2022-06-28 LAB — URINALYSIS, COMPLETE (UACMP) WITH MICROSCOPIC
Bacteria, UA: NONE SEEN
Bilirubin Urine: NEGATIVE
Glucose, UA: NEGATIVE mg/dL
Hgb urine dipstick: NEGATIVE
Ketones, ur: NEGATIVE mg/dL
Leukocytes,Ua: NEGATIVE
Nitrite: NEGATIVE
Protein, ur: NEGATIVE mg/dL
Specific Gravity, Urine: 1.016 (ref 1.005–1.030)
pH: 8 (ref 5.0–8.0)

## 2022-06-28 LAB — IRON AND IRON BINDING CAPACITY (CC-WL,HP ONLY)
Iron: 108 ug/dL (ref 28–170)
Saturation Ratios: 41 % — ABNORMAL HIGH (ref 10.4–31.8)
TIBC: 265 ug/dL (ref 250–450)
UIBC: 157 ug/dL (ref 148–442)

## 2022-06-28 LAB — CMP (CANCER CENTER ONLY)
ALT: 13 U/L (ref 0–44)
AST: 22 U/L (ref 15–41)
Albumin: 4.3 g/dL (ref 3.5–5.0)
Alkaline Phosphatase: 71 U/L (ref 38–126)
Anion gap: 5 (ref 5–15)
BUN: 11 mg/dL (ref 8–23)
CO2: 27 mmol/L (ref 22–32)
Calcium: 9.3 mg/dL (ref 8.9–10.3)
Chloride: 109 mmol/L (ref 98–111)
Creatinine: 0.76 mg/dL (ref 0.44–1.00)
GFR, Estimated: >60 mL/min (ref >60)
Glucose, Bld: 77 mg/dL (ref 70–99)
Potassium: 4.2 mmol/L (ref 3.5–5.1)
Sodium: 141 mmol/L (ref 135–145)
Total Bilirubin: 0.9 mg/dL (ref 0.3–1.2)
Total Protein: 6.9 g/dL (ref 6.5–8.1)

## 2022-06-28 LAB — FERRITIN: Ferritin: 31 ng/mL (ref 11–307)

## 2022-06-28 LAB — LACTATE DEHYDROGENASE: LDH: 175 U/L (ref 98–192)

## 2022-06-28 NOTE — Progress Notes (Signed)
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   Patient was last seen by me on 01/04/2022 and complained of grade 1 fatigue, but was doing well overall.   Patient reports she has been doing well overall without any new medical concerns since our last visit. She denies fever, chills, night sweats, shortness of breath, new respiratory infection, new bumps/lumps, abdominal pain, back pain, abnormal bowel movements, leg pain, or leg swelling. She denies any urinary infection, but reports of mild discomfort when urinating. Patient complains of occasional mild constipation.   Patient has received her influenza vaccine, but denies COVID-19 Booster. She is unsure if she has received her RSV vaccine.   MEDICAL HISTORY:  Past Medical History:  Diagnosis Date   Dyslipidemia    Lymphoma ([place]) 03/2021   Seizures ([place])     SURGICAL HISTORY: Past Surgical History:  Procedure Laterality Date   CATARACT EXTRACTION W/ INTRAOCULAR LENS IMPLANT Bilateral 2019   CESAREAN SECTION  1981, 1990   NO PAST SURGERIES     TUBAL LIGATION      SOCIAL HISTORY: Social History   Socioeconomic History   Marital status: Widowed    Spouse name: Not on file   Number of children: 2   Years of education: Not on file   Highest education level: Not on file  Occupational History   Not on file  Tobacco Use   Smoking status: Never   Smokeless tobacco: Never  Vaping Use   Vaping Use: Never used  Substance and Sexual Activity   Alcohol use: Not Currently    Alcohol/week: 0.0 standard drinks of alcohol    Drug use: No   Sexual activity: Not Currently  Other Topics Concern   Not on file  Social History Narrative   Retired from working in Engineer, 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[employer], contracting   Lives with daughter (who has mental health issues)   Has two children   Widowed   Social Determinants of Health   Financial Resource Strain: Low Risk  (05/10/2022)   Overall Financial Resource Strain (CARDIA)    Difficulty of Paying Living Expenses: Not hard at all  Food Insecurity: No Food Insecurity (05/10/2022)   Hunger Vital Sign    Worried About Running Out of Food in the Last Year: Never true    Worth in the Last Year: Never true  Transportation Needs: No Transportation Needs (05/10/2022)   PRAPARE - Hydrologist (Medical): No    Lack of Transportation (Non-Medical): No  Physical Activity: Insufficiently Active (05/10/2022)   Exercise Vital Sign    Days of Exercise per Week: 3 days    Minutes of Exercise per Session: 30 min  Stress: No Stress Concern Present (05/10/2022)   [place]    Feeling of Stress : Not at all  Social Connections: Moderately Integrated (05/10/2022)   Social Connection and Isolation Panel [NHANES]    Frequency of Communication with Friends and Family: More than three times a week    Frequency of Social Gatherings with Friends and Family: More than three  times a week    Attends Religious Services: More than 4 times per year    Active Member of Clubs or Organizations: Yes    Attends Archivist Meetings: 1 to 4 times per year    Marital Status: Widowed  Intimate Partner Violence: Not At Risk (05/10/2022)   Humiliation, Afraid, Rape, and Kick questionnaire    Fear of Current or Ex-Partner: No    Emotionally Abused: No    Physically Abused: No    Sexually Abused: No    FAMILY HISTORY: Family History  Problem Relation Age of Onset   Neuropathy Mother    Anuerysm Father     Leukemia Sister 82   Alzheimer's disease Sister 55   Dementia Brother    Heart attack Maternal Grandmother    CVA Maternal Grandfather    Dementia Paternal Grandmother    Seizures Daughter    Other Daughter        Psychological disorder   Colon cancer Neg Hx     ALLERGIES:  has No Known Allergies.  MEDICATIONS:  Current Outpatient Medications  Medication Sig Dispense Refill   atorvastatin (LIPITOR) 40 MG tablet Take 1 tablet (40 mg total) by mouth daily. 90 tablet 3   B Complex-C (VITAMIN B + C COMPLEX) TABS      cetirizine (ZYRTEC) 10 MG tablet Take 10 mg by mouth daily.     LamoTRIgine 300 MG TB24 24 hour tablet Take 1 tablet (300 mg total) by mouth daily. 90 tablet 3   Omega-3 Fatty Acids (FISH OIL) 1000 MG CAPS Take 1 capsule by mouth daily in the afternoon.     Vitamin D, Cholecalciferol, 25 MCG (1000 UT) CAPS Take 1 capsule by mouth daily in the afternoon.     No current facility-administered medications for this visit.    .10 Point review of Systems was done is negative except as noted above.  PHYSICAL EXAMINATION: ECOG PERFORMANCE STATUS: 1 - Symptomatic but completely ambulatory  . Vitals:   06/28/22 1414  BP: 118/63  Pulse: 74  Resp: 18  Temp: 98 F (36.7 C)  SpO2: 100%    Filed Weights   06/28/22 1414  Weight: 168 lb 8 oz (76.4 kg)    .Body mass index is 28.92 kg/m.  NAD GENERAL:alert, in no acute distress and comfortable SKIN: no acute rashes, no significant lesions EYES: conjunctiva are pink and non-injected, sclera anicteric NECK: supple, no JVD LYMPH:  no palpable lymphadenopathy in the cervical, axillary or inguinal regions LUNGS: clear to auscultation b/l with normal respiratory effort HEART: regular rate & rhythm ABDOMEN:  normoactive bowel sounds , non tender, not distended. Extremity: no pedal edema PSYCH: alert & oriented x 3 with fluent speech NEURO: no focal motor/sensory deficits  LABORATORY DATA:  I have reviewed the data  as listed  .    Latest Ref Rng & Units 06/28/2022    1:41 PM 01/04/2022    2:20 PM 12/03/2021    9:27 AM  CBC  WBC 4.0 - 10.5 K/uL 5.8  5.1  5.3   Hemoglobin 12.0 - 15.0 g/dL 11.5  11.7  11.2   Hematocrit 36.0 - 46.0 % 35.4  36.0  34.4   Platelets 150 - 400 K/uL 216  248  240.0    .CBC    Component Value Date/Time   WBC 5.8 06/28/2022 1341   WBC 5.3 12/03/2021 0927   RBC 4.02 06/28/2022 1341   HGB 11.5 (L) 06/28/2022 1341   HGB 11.3  12/14/2018 1017   HCT 35.4 (L) 06/28/2022 1341   HCT 31.5 (L) 04/02/2021 1304   PLT 216 06/28/2022 1341   PLT 289 12/14/2018 1017   MCV 88.1 06/28/2022 1341   MCV 87 12/14/2018 1017   MCH 28.6 06/28/2022 1341   MCHC 32.5 06/28/2022 1341   RDW 13.8 06/28/2022 1341   RDW 13.6 12/14/2018 1017   LYMPHSABS 2.6 06/28/2022 1341   LYMPHSABS 1.3 12/14/2018 1017   MONOABS 0.6 06/28/2022 1341   EOSABS 0.3 06/28/2022 1341   EOSABS 0.3 12/14/2018 1017   BASOSABS 0.0 06/28/2022 1341   BASOSABS 0.0 12/14/2018 1017       Latest Ref Rng & Units 06/28/2022    1:41 PM 01/04/2022    2:20 PM 12/03/2021    9:27 AM  CMP  Glucose 70 - 99 mg/dL 77  95  90   BUN 8 - 23 mg/dL '11  11  17   '$ Creatinine 0.44 - 1.00 mg/dL 0.76  0.67  0.73   Sodium 135 - 145 mmol/L 141  142  142   Potassium 3.5 - 5.1 mmol/L 4.2  3.6  3.9   Chloride 98 - 111 mmol/L 109  107  106   CO2 22 - 32 mmol/L '27  29  31   '$ Calcium 8.9 - 10.3 mg/dL 9.3  9.6  9.5   Total Protein 6.5 - 8.1 g/dL 6.9  6.9  6.8   Total Bilirubin 0.3 - 1.2 mg/dL 0.9  0.7  0.4   Alkaline Phos 38 - 126 U/L 71  82  70   AST 15 - 41 U/L '22  22  20   '$ ALT 0 - 44 U/L '13  15  15    '$ . Lab Results  Component Value Date   LDH 175 06/28/2022   Component     Latest Ref Rng & Units 04/02/2021  Iron     28 - 170 ug/dL 48  TIBC     250 - 450 ug/dL 234 (L)  Saturation Ratios     10.4 - 31.8 % 20  UIBC     ug/dL 186  Folate, Hemolysate     Not Estab. ng/mL 287.0  HCT     34.0 - 46.6 % 31.5 (L)  Folate, RBC     >498  ng/mL 911  DAT, complement      NEG  DAT, IgG      NEG . . .  Hep B Core Total Ab     NON REACTIVE NON REACTIVE  Hepatitis B Surface Ag     NON REACTIVE NON REACTIVE  HCV Ab     NON REACTIVE NON REACTIVE  Haptoglobin     37 - 355 mg/dL 174  Ferritin     11 - 307 ng/mL 131  Vitamin B12     180 - 914 pg/mL 789  LDH     98 - 192 U/L 181    SURGICAL PATHOLOGY  CASE: [id number]  PATIENT: Katie Mcneil  Surgical Pathology Report      Clinical History: diffuse LAN, no primary, R/O lymphoma (cm)    FINAL MICROSCOPIC DIAGNOSIS:   A. LYMPH NODE, RIGHT INGUINAL, NEEDLE CORE BIOPSY:  -  Non-Hodgkin B-cell lymphoma  -  See comment   COMMENT:   The biopsy consists of three small lymph node cores composed of a  monotonous proliferation of small to medium sized lymphocytes without  well-formed, back to back follicles.  By immunohistochemistry there is a  slight predominance of B cells by CD20 and CD3 immunohistochemistry.  The B cells appear to co-express CD79a, CD10, BCL6 and Bcl-2 both within  follicles and within the interfollicular spaces. CD23 shows expanded  dendritic meshworks. The B-cells are negative for CD5 and cyclin D1.  Flow cytometry identified a kappa-restricted monoclonal B-cell  population expressing CD10 (42% of all lymphocytes; See [id number]).  Overall, the findings are consistent with a non-Hodgkin B-cell lymphoma,  most likely follicular lymphoma.  [place] for X(32;44) is pending and will  be reported in an addendum along with ki-67.  Unfortunately, given the  limited and fragmented nature of the sample accurate grading of the  lymphoma is not possible on the sample provided.   SURGICAL PATHOLOGY Surgical Pathology   THIS IS AN ADDENDUM REPORT   CASE: [id number]  PATIENT: Katie Mcneil  Bone Marrow Report  Addendum    Reason for Addendum #1:  Molecular Add-on   Clinical History: Follicular lymphoma of lymphoma nodes of multiple  regions,  unspecified grade ([place])  ,( [facility])    DIAGNOSIS:   BONE MARROW, ASPIRATE, CLOT, CORE:  -  Hypercellular marrow involved by follicular lymphoma  -  See microscopic description and comment below   PERIPHERAL BLOOD:  -  Normocytic anemia  -  See complete blood cell count   COMMENT:   The combined morphologic and flow cytometric features are consistent  with bone marrow involvement by the patient's previously diagnosed  follicular lymphoma.  Correlation with cytogenetics is recommended.    ADDENDUM:   ** Please note this testing was performed and interpreted by an outside  facility ([facility]).  This addendum is only being added to provide a  summary of the results for report completeness.  Please see electronic  medical record for a copy of the full report. **   Karyotype:  46,X,t(X;18)(q13;q11.2),+der(X)t(X;18),add(6)(q12),-11,t(12;13)(q24.1;q1  2),t(14;18)(q32;q21)[2]/46,XX[18]   Interpretation:  ABNORMAL FEMALE KARYOTYPE   The abnormalities include loss of material from the long arm of  chromosome 6 and the t(14;18)(q32;q21) - both recurring abnormalities in  lymphomas. The t(14;18)(q32;q21.3), involving the IGH gene at 14q32.3  and the BCL2 gene at 18q21.3, is observed in 01% of follicular  lymphomas.    RADIOGRAPHIC STUDIES: I have personally reviewed the radiological images as listed and agreed with the findings in the report. CT CHEST ABDOMEN PELVIS W CONTRAST  Result Date: 06/22/2022 CLINICAL DATA:  Hematologic malignancy history of follicular lymphoma, monitor * Tracking Code: BO * EXAM: CT CHEST, ABDOMEN, AND PELVIS WITH CONTRAST TECHNIQUE: Multidetector CT imaging of the chest, abdomen and pelvis was performed following the standard protocol during bolus administration of intravenous contrast. RADIATION DOSE REDUCTION: This exam was performed according to the departmental dose-optimization program which includes automated exposure control, adjustment of the mA and/or  kV according to patient size and/or use of iterative reconstruction technique. CONTRAST:  156m OMNIPAQUE IOHEXOL 300 MG/ML  SOLN COMPARISON:  PET-CT April 20, 2021. FINDINGS: CT CHEST FINDINGS Cardiovascular: Aortic atherosclerosis. No central pulmonary embolus on this nondedicated study. Normal size heart. No significant pericardial effusion/thickening. Calcifications of the left anterior descending coronary artery. Mediastinum/Nodes: Lower cervical/supraclavicular adenopathy. For reference: Left supraclavicular lymph node measures 10 mm in short axis on image 5/2 previously 5 mm. Interval increase in the mediastinal and hilar adenopathy. Previously indexed lymph nodes are as follows: - left axillary lymph node measures 2 cm in short axis on image 9/2 previously 1.5 cm. - low right paratracheal lymph node measures 12 mm in short axis on image  19/2 previously 11 mm. Small bilateral hypodense thyroid nodules measure up to 12 mm in the right lobe of the thyroid on image 4/2. Not clinically significant; no follow-up imaging recommended (ref: J Am Coll Radiol. 2015 Feb;12(2): 143-50).The esophagus is grossly unremarkable. Lungs/Pleura: No suspicious pulmonary nodules or masses. No pleural effusion. No pneumothorax. Musculoskeletal: No aggressive lytic or blastic lesion of bone. Multilevel degenerative changes spine. CT ABDOMEN PELVIS FINDINGS Hepatobiliary: No suspicious hepatic lesion. Gallbladder is unremarkable. No biliary ductal dilation. Pancreas: No pancreatic ductal dilation or evidence of acute inflammation. Spleen: No splenomegaly. Adrenals/Urinary Tract: Bilateral adrenal glands are within normal limits. No hydronephrosis. Kidneys demonstrate symmetric enhancement and excretion of contrast material. Symmetric wall thickening of an incompletely distended urinary bladder. Stomach/Bowel: Radiopaque enteric contrast material traverses the hepatic flexure. Stomach is unremarkable for degree of distension. The  appendix and terminal ileum appear normal. Moderate volume of formed stool throughout the colon. Vascular/Lymphatic: Aortic atherosclerosis. Smooth IVC contours. Encasement of the aorta and IVC by large retroperitoneal lymph nodes. Interval progression of the periportal, abdominopelvic retroperitoneal, mesenteric and inguinal lymph nodes. Previously indexed lymph nodes are as follows: - periportal lymph node measures 2.7 cm in short axis on image 54/2 previously 2.4 cm. - right common iliac lymph node measures 1.6 cm in short axis on image 82/2 previously 1.2 cm. -small-bowel bowel mesenteric lymph node in the left paramidline mesentery measures 14 mm in short axis previously 11 mm when remeasured for consistency. - left inguinal lymph node measures 16 mm in short axis on image 115/2 previously 11 mm. Reproductive: Calcified uterine leiomyoma. No suspicious adnexal mass. Other: No significant abdominopelvic free fluid. Musculoskeletal: No aggressive lytic or blastic lesion of bone. Multilevel degenerative changes spine. Degenerative change of the hips. Degenerative changes bilateral SI joints with partial bony ankylosis of the left SI joint. Chronic osseous changes of the pubic symphysis. IMPRESSION: 1. Increased adenopathy above and below the diaphragm, consistent with lymphomatous disease progression. 2. No splenomegaly. 3. Moderate volume of formed stool throughout the colon. Correlate for constipation. 4. Symmetric wall thickening of an incompletely distended urinary bladder. Correlate with urinalysis to exclude cystitis. 5.  Aortic Atherosclerosis (ICD10-I70.0). Electronically Signed   By: Dahlia Bailiff M.D.   On: 06/22/2022 11:14    ASSESSMENT & PLAN:   72 year old female with history of seizure disorder with  1) Recently diagnosed stage IVa follicular lymphoma PET/CT reviewed as noted above  2) history of seizure disorder on Lamictal  3) normocytic anemia could be from lymphoma plus her recent  pneumonia hemoglobin is improved to 11.7 from 11.2.  PLAN: -Patient has no clinical symptoms or signs of significant follicular lymphoma progression at this time. -Patient has no significant symptomatic follicular lymphoma progression at this time -No acute new indications to treat her low-grade follicular lymphoma at this time and patient prefers to take a more conservative approach. -Discussed lab results from today, 06/28/2022, with the patient. CBC shows slightly decreased hemoglobin of 11.5 K and hematocrit of 35.4. CMP stable. LDH WNL -Discussed CT Abdominal Pelvis With Contrast results from 06/21/2022. Showed: 1. Increased adenopathy above and below the diaphragm, consistent with lymphomatous disease progression. 2. No splenomegaly. 3. Moderate volume of formed stool throughout the colon. . 4. Symmetric wall thickening of an incompletely distended urinary bladder. Correlate with urinalysis to exclude cystitis. 5.  Aortic Atherosclerosis (ICD10-I70.0). -Recommended the COVID-19 Booster and RSV vaccine.  -Plan urine testing during this visit. -- UA neg for infection  FOLLOW-UP: RTC with Dr Irene Limbo with  labs in 6 months  The total time spent in the appointment was 32 minutes* .  All of the patient's questions were answered with apparent satisfaction. The patient knows to call the clinic with any problems, questions or concerns.   Sullivan Lone MD MS AAHIVMS [facility] Hematology/Oncology Physician [facility]  .*Total Encounter Time as defined by the Centers for Medicare and Medicaid Services includes, in addition to the face-to-face time of a patient visit (documented in the note above) non-face-to-face time: obtaining and reviewing outside history, ordering and reviewing medications, tests or procedures, care coordination (communications with other health care professionals or caregivers) and documentation in the medical record.   I, Cleda Mccreedy, am acting as a Education administrator for Sullivan Lone, MD. .I have reviewed the above documentation for accuracy and completeness, and I agree with the above. Brunetta Genera MD

## 2022-07-19 ENCOUNTER — Other Ambulatory Visit: Payer: Self-pay | Admitting: *Deleted

## 2022-07-19 DIAGNOSIS — E7801 Familial hypercholesterolemia: Secondary | ICD-10-CM

## 2022-09-15 ENCOUNTER — Ambulatory Visit (HOSPITAL_BASED_OUTPATIENT_CLINIC_OR_DEPARTMENT_OTHER): Payer: Medicare Other | Admitting: Internal Medicine

## 2022-10-19 LAB — NMR, LIPOPROFILE
Cholesterol, Total: 172 mg/dL (ref 100–199)
HDL Particle Number: 33.2 umol/L (ref >=30.5)
HDL-C: 66 mg/dL (ref >39)
LDL Particle Number: 1354 nmol/L — ABNORMAL HIGH (ref <1000)
LDL Size: 20.8 nm (ref >20.5)
LDL-C (NIH Calc): 93 mg/dL (ref 0–99)
LP-IR Score: 29 (ref <=45)
Small LDL Particle Number: 399 nmol/L (ref <=527)
Triglycerides: 69 mg/dL (ref 0–149)

## 2022-10-29 ENCOUNTER — Ambulatory Visit (HOSPITAL_BASED_OUTPATIENT_CLINIC_OR_DEPARTMENT_OTHER): Payer: Medicare Other | Admitting: Family

## 2022-11-30 ENCOUNTER — Other Ambulatory Visit: Payer: Self-pay

## 2022-11-30 ENCOUNTER — Telehealth: Payer: Self-pay | Admitting: Physician Assistant

## 2022-11-30 DIAGNOSIS — Z1211 Encounter for screening for malignant neoplasm of colon: Secondary | ICD-10-CM

## 2022-11-30 NOTE — Telephone Encounter (Signed)
Patient called to request a referral for preventative colonoscopy. Please Advise.

## 2022-11-30 NOTE — Telephone Encounter (Signed)
Referral placed for overdue Colonoscopy

## 2022-12-17 ENCOUNTER — Encounter: Payer: Self-pay | Admitting: Internal Medicine

## 2022-12-17 ENCOUNTER — Telehealth: Payer: Self-pay | Admitting: Internal Medicine

## 2022-12-17 ENCOUNTER — Ambulatory Visit: Payer: Medicare Other | Attending: Internal Medicine | Admitting: Internal Medicine

## 2022-12-17 VITALS — BP 130/72 | HR 84 | Ht 66.0 in | Wt 176.2 lb

## 2022-12-17 DIAGNOSIS — E7801 Familial hypercholesterolemia: Secondary | ICD-10-CM | POA: Diagnosis not present

## 2022-12-17 DIAGNOSIS — I2510 Atherosclerotic heart disease of native coronary artery without angina pectoris: Secondary | ICD-10-CM | POA: Diagnosis not present

## 2022-12-17 DIAGNOSIS — E7841 Elevated Lipoprotein(a): Secondary | ICD-10-CM | POA: Insufficient documentation

## 2022-12-17 DIAGNOSIS — E785 Hyperlipidemia, unspecified: Secondary | ICD-10-CM

## 2022-12-17 NOTE — Telephone Encounter (Signed)
Patient has been identified as candidate for Leqvio  Benefits investigation enrollment investigation faxed on 12/17/22

## 2022-12-17 NOTE — Patient Instructions (Signed)
Medication Instructions:  Dr. Rennis Golden has recommended an injectable medication called LEQVIO. This is administered by a health care provider. The frequency of injections is TWO injections given 3 months apart (loading dose) and then every 6 months after that. The injection appointments at [facility] ([address] [place], [place]  [address]). Once we have the benefits check information, we will reach out to let you know if the medication is covered 100%, if there is a deductible, co-insurance, out-of-pocket max. From there, we will see if you need patient assistance and our team will take care of working on this. Because of the frequency schedule of this medication, your follow up/repeat cholesterol lab work will be about 5-6 months from now.   *If you need a refill on your cardiac medications before your next appointment, please call your pharmacy*   Lab Work: FASTING lab work in 5-6 months to check cholesterol   If you have labs (blood work) drawn today and your tests are completely normal, you will receive your results only by: MyChart Message (if you have MyChart) OR A paper copy in the mail If you have any lab test that is abnormal or we need to change your treatment, we will call you to review the results.   Follow-Up: At [facility], you and your health needs are our priority.  As part of our continuing mission to provide you with exceptional heart care, we have created designated Provider Care Teams.  These Care Teams include your primary Cardiologist (physician) and Advanced Practice Providers (APPs -  Physician Assistants and Nurse Practitioners) who all work together to provide you with the care you need, when you need it.  We recommend signing up for the patient portal called "MyChart".  Sign up information is provided on this After Visit Summary.  MyChart is used to connect with patients for Virtual Visits (Telemedicine).  Patients are able to view  lab/test results, encounter notes, upcoming appointments, etc.  Non-urgent messages can be sent to your provider as well.   To learn more about what you can do with MyChart, go to [email or web address].    Your next appointment:    5-6 months with Dr. Rennis Golden

## 2022-12-17 NOTE — Progress Notes (Signed)
LIPID CLINIC CONSULT NOTE  Chief Complaint:  Manage dyslipidemia  Primary Care Physician: Katie Motto, PA  Primary Cardiologist:  None  HPI:  Katie Mcneil is a 72 y.o. female who is being seen today for the evaluation of dyslipidemia at the request of Katie Mcneil, [name]. This is a pleasant 72 year old female kindly referred for evaluation management of dyslipidemia.  She has lost had a history of high cholesterol but has not been on therapy.  She reports her mother had high cholesterol as well but suffered from neuropathy and had side effects related to statins therefore she has been hesitant to take them.  Most recently her total cholesterol is 292, triglycerides 107, HDL 68 and LDL 203.  She did have a CT pulmonary angiogram performed in the ER recently which showed some coronary artery calcification and aortic atherosclerosis suggestive that there is evidence for ASCVD.  12/17/2022  Katie Mcneil is seen today in follow-up.  She has had a pretty good response to atorvastatin.  She seems to be tolerating it well without any side effects.  Her LDL has come down to 93 with a particle number of 1354, HDL 66 and triglycerides 69.  Her target LDL is less than 70 and therefore she remains above target.  Of note her prior LP(a) was elevated at 286.8 nmol/L.  PMHx:  Past Medical History:  Diagnosis Date   Dyslipidemia    Lymphoma (HCC) 03/2021   Seizures (HCC)     Past Surgical History:  Procedure Laterality Date   CATARACT EXTRACTION W/ INTRAOCULAR LENS IMPLANT Bilateral 2019   CESAREAN SECTION  1981, 1990   NO PAST SURGERIES     TUBAL LIGATION      FAMHx:  Family History  Problem Relation Age of Onset   Neuropathy Mother    Anuerysm Father    Leukemia Sister 15   Alzheimer's disease Sister 68   Dementia Brother    Heart attack Maternal Grandmother    CVA Maternal Grandfather    Dementia Paternal Grandmother    Seizures Daughter    Other Daughter        Psychological  disorder   Colon cancer Neg Hx     SOCHx:   reports that she has never smoked. She has never used smokeless tobacco. She reports that she does not currently use alcohol. She reports that she does not use drugs.  ALLERGIES:  No Known Allergies  ROS: Pertinent items noted in HPI and remainder of comprehensive ROS otherwise negative.  HOME MEDS: Current Outpatient Medications on File Prior to Visit  Medication Sig Dispense Refill   atorvastatin (LIPITOR) 40 MG tablet Take 1 tablet (40 mg total) by mouth daily. 90 tablet 3   B Complex-C (VITAMIN B + C COMPLEX) TABS      cetirizine (ZYRTEC) 10 MG tablet Take 10 mg by mouth daily.     LamoTRIgine 300 MG TB24 24 hour tablet Take 1 tablet (300 mg total) by mouth daily. 90 tablet 3   Omega-3 Fatty Acids (FISH OIL) 1000 MG CAPS Take 1 capsule by mouth daily in the afternoon.     Vitamin D, Cholecalciferol, 25 MCG (1000 UT) CAPS Take 1 capsule by mouth daily in the afternoon.     No current facility-administered medications on file prior to visit.    LABS/IMAGING: No results found for this or any previous visit (from the past 48 hour(s)). No results found.  LIPID PANEL:    Component Value Date/Time   CHOL  292 (H) 12/03/2021 0927   TRIG 107.0 12/03/2021 0927   HDL 68.10 12/03/2021 0927   CHOLHDL 4 12/03/2021 0927   VLDL 21.4 12/03/2021 0927   LDLCALC 203 (H) 12/03/2021 0927    WEIGHTS: Wt Readings from Last 3 Encounters:  12/17/22 176 lb 3.2 oz (79.9 kg)  06/28/22 168 lb 8 oz (76.4 kg)  05/14/22 169 lb (76.7 kg)    VITALS: BP 130/72 (BP Location: Left Arm, Patient Position: Sitting, Cuff Size: Normal)   Pulse 84   Ht 5\' 6"  (1.676 m)   Wt 176 lb 3.2 oz (79.9 kg)   SpO2 96%   BMI 28.44 kg/m   EXAM: Deferred  EKG: Deferred  ASSESSMENT: Probable familial hyperlipidemia, LDL greater than 190 Family history of high cholesterol and her mother Coronary artery calcification and aortic atherosclerosis Elevated LP(a) of  286.8 nmol/L  PLAN: 1.   Katie Mcneil has had a significant improvement in her LDL now down to 93 but her target LDL is less than 70 and her NMR particle numbers are elevated at 1354.  This is likely explained by high LP(a) which was 286.8 nmol/L.  She will likely need additional LDL lowering per current guidelines for familial hyperlipidemia with LDL target of 50% or greater and less than 70 mg/dL.  Based on that I think she is a good candidate to add PCSK9 inhibitor therapy.  We discussed several options however her insurance seems to be most amenable to [facility] since she has gap coverage for Medicare.  This would also be consistent with her interest in reducing frequency or number of medications since it is every 6 months.  Will reach out for prior authorization for this medication and plan repeat lipid NMR and LP(a) in about 4 to 6 months.  Katie Nose, MD, [facility], FACP    [facility]  Medical Director of the Advanced Lipid Disorders &  Cardiovascular Risk Reduction Clinic Diplomate of the American Board of Clinical Lipidology Attending Cardiologist  Direct Dial: [phone]  Fax: [phone]  Website:  [email or web address]..[email or web address] 12/17/2022, 3:40 PM

## 2022-12-23 NOTE — Telephone Encounter (Signed)
Patient has been identified as candidate for Leqvio  Benefits investigation enrollment completed on 12/21/22  Benefits investigation report notes the following:  Type of insurance: Medicare, UHC/AARP supplement plan G  OOP Max: NONE / Met: N/A  Deductible: $240  Co-insurance: 20%  PA required: NO  PA phone number: N/A  Benefits Summary Details:   Primary:  Medicare Part B covers leqvio 80% after the patient meets the $240 calendar year deductible. Once deductible is met, patient is responsible for 20% coinsurance. No PA is needed.   Secondary: UHC Medicare Supplement plan G -- covers 20% coinsurance. Supplement does not cover deductible. PA not required.

## 2022-12-23 NOTE — Telephone Encounter (Signed)
Left message for patient that message will be sent thru MyChart re: Leqvio BIV

## 2022-12-24 ENCOUNTER — Other Ambulatory Visit: Payer: Self-pay

## 2022-12-24 DIAGNOSIS — C8298 Follicular lymphoma, unspecified, lymph nodes of multiple sites: Secondary | ICD-10-CM

## 2022-12-27 ENCOUNTER — Inpatient Hospital Stay (HOSPITAL_BASED_OUTPATIENT_CLINIC_OR_DEPARTMENT_OTHER): Payer: Medicare Other | Admitting: Hematology

## 2022-12-27 ENCOUNTER — Inpatient Hospital Stay: Payer: Medicare Other | Attending: Hematology

## 2022-12-27 VITALS — BP 124/63 | HR 59 | Temp 100.5°F | Resp 20 | Wt 175.5 lb

## 2022-12-27 DIAGNOSIS — C8288 Other types of follicular lymphoma, lymph nodes of multiple sites: Secondary | ICD-10-CM | POA: Insufficient documentation

## 2022-12-27 DIAGNOSIS — Z806 Family history of leukemia: Secondary | ICD-10-CM | POA: Diagnosis not present

## 2022-12-27 DIAGNOSIS — G40909 Epilepsy, unspecified, not intractable, without status epilepticus: Secondary | ICD-10-CM | POA: Diagnosis not present

## 2022-12-27 DIAGNOSIS — C8298 Follicular lymphoma, unspecified, lymph nodes of multiple sites: Secondary | ICD-10-CM

## 2022-12-27 DIAGNOSIS — D649 Anemia, unspecified: Secondary | ICD-10-CM | POA: Insufficient documentation

## 2022-12-27 LAB — CBC WITH DIFFERENTIAL (CANCER CENTER ONLY)
Abs Immature Granulocytes: 0 10*3/uL (ref 0.00–0.07)
Basophils Absolute: 0 10*3/uL (ref 0.0–0.1)
Basophils Relative: 1 %
Eosinophils Absolute: 0.2 10*3/uL (ref 0.0–0.5)
Eosinophils Relative: 4 %
HCT: 34.6 % — ABNORMAL LOW (ref 36.0–46.0)
Hemoglobin: 11.2 g/dL — ABNORMAL LOW (ref 12.0–15.0)
Immature Granulocytes: 0 %
Lymphocytes Relative: 38 %
Lymphs Abs: 2 10*3/uL (ref 0.7–4.0)
MCH: 28 pg (ref 26.0–34.0)
MCHC: 32.4 g/dL (ref 30.0–36.0)
MCV: 86.5 fL (ref 80.0–100.0)
Monocytes Absolute: 0.6 10*3/uL (ref 0.1–1.0)
Monocytes Relative: 11 %
Neutro Abs: 2.4 10*3/uL (ref 1.7–7.7)
Neutrophils Relative %: 46 %
Platelet Count: 237 10*3/uL (ref 150–400)
RBC: 4 MIL/uL (ref 3.87–5.11)
RDW: 13.8 % (ref 11.5–15.5)
WBC Count: 5.2 10*3/uL (ref 4.0–10.5)
nRBC: 0 % (ref 0.0–0.2)

## 2022-12-27 LAB — IRON AND IRON BINDING CAPACITY (CC-WL,HP ONLY)
Iron: 97 ug/dL (ref 28–170)
Saturation Ratios: 37 % — ABNORMAL HIGH (ref 10.4–31.8)
TIBC: 262 ug/dL (ref 250–450)
UIBC: 165 ug/dL (ref 148–442)

## 2022-12-27 LAB — CMP (CANCER CENTER ONLY)
ALT: 19 U/L (ref 0–44)
AST: 24 U/L (ref 15–41)
Albumin: 4.5 g/dL (ref 3.5–5.0)
Alkaline Phosphatase: 76 U/L (ref 38–126)
Anion gap: 7 (ref 5–15)
BUN: 11 mg/dL (ref 8–23)
CO2: 25 mmol/L (ref 22–32)
Calcium: 9.4 mg/dL (ref 8.9–10.3)
Chloride: 109 mmol/L (ref 98–111)
Creatinine: 0.67 mg/dL (ref 0.44–1.00)
GFR, Estimated: >60 mL/min (ref >60)
Glucose, Bld: 87 mg/dL (ref 70–99)
Potassium: 4.1 mmol/L (ref 3.5–5.1)
Sodium: 141 mmol/L (ref 135–145)
Total Bilirubin: 0.8 mg/dL (ref 0.3–1.2)
Total Protein: 7 g/dL (ref 6.5–8.1)

## 2022-12-27 LAB — LACTATE DEHYDROGENASE: LDH: 162 U/L (ref 98–192)

## 2022-12-27 LAB — FERRITIN: Ferritin: 28 ng/mL (ref 11–307)

## 2022-12-27 NOTE — Progress Notes (Signed)
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   Patient was last seen by me on 06/28/2022 and she complained of mild constipation, but was doing well overall.   Patient notes she has been doing well overall since our last visit. She does complains of occasional fatigue since our last visit. She notes that the fatigue does not affect her quality of life.   She denies any new infection issues, bone pain, fever, chills, night sweats, unexpected weight loss, skin rashes, chest pain, abdominal pain, back pain, leg swelling, abnormal bleeding, or any new lumps/bumps.   She is currently taking vitamin-D supplement.  MEDICAL HISTORY:  Past Medical History:  Diagnosis Date   Dyslipidemia    Lymphoma (HCC) 03/2021   Seizures (HCC)     SURGICAL HISTORY: Past Surgical History:  Procedure Laterality Date   CATARACT EXTRACTION W/ INTRAOCULAR LENS IMPLANT Bilateral 2019   CESAREAN SECTION  1981, 1990   NO PAST SURGERIES     TUBAL LIGATION      SOCIAL HISTORY: Social History   Socioeconomic History   Marital status: Widowed    Spouse name: Not on file   Number of children: 2   Years of education: Not on file   Highest education level: Not on file  Occupational History   Not on file  Tobacco Use   Smoking status: Never   Smokeless tobacco: Never  Vaping Use   Vaping status: Never Used  Substance and Sexual Activity   Alcohol use: Not Currently    Alcohol/week: 0.0 standard drinks of alcohol   Drug use: No   Sexual activity: Not Currently  Other Topics Concern   Not  on file  Social History Narrative   Retired from working in Education officer, environmental at a [employer]   Lives with daughter (who has mental health issues)   Has two children   Widowed   Social Determinants of Health   Financial Resource Strain: Low Risk  (05/10/2022)   Overall Financial Resource Strain (CARDIA)    Difficulty of Paying Living Expenses: Not hard at all  Food Insecurity: No Food Insecurity (05/10/2022)   Hunger Vital Sign    Worried About Running Out of Food in the Last Year: Never true    Ran Out of Food in the Last Year: Never true  Transportation Needs: No Transportation Needs (05/10/2022)   PRAPARE - Administrator, Civil Service (Medical): No    Lack of Transportation (Non-Medical): No  Physical Activity: Insufficiently Active (05/10/2022)   Exercise Vital Sign    Days of Exercise per Week: 3 days    Minutes of Exercise per Session: 30 min  Stress: No Stress Concern Present (05/10/2022)   [employer] of Occupational Health - Occupational Stress Questionnaire    Feeling of Stress : Not at all  Social Connections: Moderately Integrated (05/10/2022)   Social Connection and Isolation Panel [NHANES]    Frequency of Communication with Friends and Family: More than three times a week    Frequency of Social Gatherings with Friends and Family: More than three times a week    Attends Religious Services:  More than 4 times per year    Active Member of Clubs or Organizations: Yes    Attends Banker Meetings: 1 to 4 times per year    Marital Status: Widowed  Intimate Partner Violence: Not At Risk (05/10/2022)   Humiliation, Afraid, Rape, and Kick questionnaire    Fear of Current or Ex-Partner: No    Emotionally Abused: No    Physically Abused: No    Sexually Abused: No    FAMILY HISTORY: Family History  Problem Relation Age of Onset   Neuropathy Mother    Anuerysm Father    Leukemia Sister 62   Alzheimer's disease Sister 5   Dementia Brother     Heart attack Maternal Grandmother    CVA Maternal Grandfather    Dementia Paternal Grandmother    Seizures Daughter    Other Daughter        Psychological disorder   Colon cancer Neg Hx     ALLERGIES:  has No Known Allergies.  MEDICATIONS:  Current Outpatient Medications  Medication Sig Dispense Refill   atorvastatin (LIPITOR) 40 MG tablet Take 1 tablet (40 mg total) by mouth daily. 90 tablet 3   B Complex-C (VITAMIN B + C COMPLEX) TABS      cetirizine (ZYRTEC) 10 MG tablet Take 10 mg by mouth daily.     LamoTRIgine 300 MG TB24 24 hour tablet Take 1 tablet (300 mg total) by mouth daily. 90 tablet 3   Omega-3 Fatty Acids (FISH OIL) 1000 MG CAPS Take 1 capsule by mouth daily in the afternoon.     Vitamin D, Cholecalciferol, 25 MCG (1000 UT) CAPS Take 1 capsule by mouth daily in the afternoon.     No current facility-administered medications for this visit.    .10 Point review of Systems was done is negative except as noted above.  PHYSICAL EXAMINATION: ECOG PERFORMANCE STATUS: 1 - Symptomatic but completely ambulatory  . Vitals:   12/27/22 1250  BP: 124/63  Pulse: (!) 59  Resp: 20  Temp: (!) 100.5 F (38.1 C)  SpO2: 98%   Filed Weights   12/27/22 1250  Weight: 175 lb 8 oz (79.6 kg)  .Body mass index is 28.33 kg/m.  NAD GENERAL:alert, in no acute distress and comfortable SKIN: no acute rashes, no significant lesions EYES: conjunctiva are pink and non-injected, sclera anicteric NECK: supple, no JVD LYMPH:  no palpable lymphadenopathy in the cervical, axillary or inguinal regions LUNGS: clear to auscultation b/l with normal respiratory effort HEART: regular rate & rhythm ABDOMEN:  normoactive bowel sounds , non tender, not distended. Extremity: no pedal edema PSYCH: alert & oriented x 3 with fluent speech NEURO: no focal motor/sensory deficits  LABORATORY DATA:  I have reviewed the data as listed  .    Latest Ref Rng & Units 12/27/2022   12:33 PM 06/28/2022     1:41 PM 01/04/2022    2:20 PM  CBC  WBC 4.0 - 10.5 K/uL 5.2  5.8  5.1   Hemoglobin 12.0 - 15.0 g/dL 13.0  86.5  78.4   Hematocrit 36.0 - 46.0 % 34.6  35.4  36.0   Platelets 150 - 400 K/uL 237  216  248    .CBC    Component Value Date/Time   WBC 5.2 12/27/2022 1233   WBC 5.3 12/03/2021 0927   RBC 4.00 12/27/2022 1233   HGB 11.2 (L) 12/27/2022 1233   HGB 11.3 12/14/2018 1017   HCT 34.6 (L) 12/27/2022 1233  HCT 31.5 (L) 04/02/2021 1304   PLT 237 12/27/2022 1233   PLT 289 12/14/2018 1017   MCV 86.5 12/27/2022 1233   MCV 87 12/14/2018 1017   MCH 28.0 12/27/2022 1233   MCHC 32.4 12/27/2022 1233   RDW 13.8 12/27/2022 1233   RDW 13.6 12/14/2018 1017   LYMPHSABS 2.0 12/27/2022 1233   LYMPHSABS 1.3 12/14/2018 1017   MONOABS 0.6 12/27/2022 1233   EOSABS 0.2 12/27/2022 1233   EOSABS 0.3 12/14/2018 1017   BASOSABS 0.0 12/27/2022 1233   BASOSABS 0.0 12/14/2018 1017       Latest Ref Rng & Units 12/27/2022   12:33 PM 06/28/2022    1:41 PM 01/04/2022    2:20 PM  CMP  Glucose 70 - 99 mg/dL 87  77  95   BUN 8 - 23 mg/dL 11  11  11    Creatinine 0.44 - 1.00 mg/dL 4.09  8.11  9.14   Sodium 135 - 145 mmol/L 141  141  142   Potassium 3.5 - 5.1 mmol/L 4.1  4.2  3.6   Chloride 98 - 111 mmol/L 109  109  107   CO2 22 - 32 mmol/L 25  27  29    Calcium 8.9 - 10.3 mg/dL 9.4  9.3  9.6   Total Protein 6.5 - 8.1 g/dL 7.0  6.9  6.9   Total Bilirubin 0.3 - 1.2 mg/dL 0.8  0.9  0.7   Alkaline Phos 38 - 126 U/L 76  71  82   AST 15 - 41 U/L 24  22  22    ALT 0 - 44 U/L 19  13  15     . Lab Results  Component Value Date   LDH 162 12/27/2022   Component     Latest Ref Rng & Units 04/02/2021  Iron     28 - 170 ug/dL 48  TIBC     782 - 956 ug/dL 213 (L)  Saturation Ratios     10.4 - 31.8 % 20  UIBC     ug/dL 086  Folate, Hemolysate     Not Estab. ng/mL 287.0  HCT     34.0 - 46.6 % 31.5 (L)  Folate, RBC     >498 ng/mL 911  DAT, complement      NEG  DAT, IgG      NEG . . .  Hep B Core  Total Ab     NON REACTIVE NON REACTIVE  Hepatitis B Surface Ag     NON REACTIVE NON REACTIVE  HCV Ab     NON REACTIVE NON REACTIVE  Haptoglobin     37 - 355 mg/dL 578  Ferritin     11 - 307 ng/mL 131  Vitamin B12     180 - 914 pg/mL 789  LDH     98 - 192 U/L 181    SURGICAL PATHOLOGY  CASE: [id number]  PATIENT: Lodema Seals  Surgical Pathology Report      Clinical History: diffuse LAN, no primary, R/O lymphoma (cm)    FINAL MICROSCOPIC DIAGNOSIS:   A. LYMPH NODE, RIGHT INGUINAL, NEEDLE CORE BIOPSY:  -  Non-Hodgkin B-cell lymphoma  -  See comment   COMMENT:   The biopsy consists of three small lymph node cores composed of a  monotonous proliferation of small to medium sized lymphocytes without  well-formed, back to back follicles.  By immunohistochemistry there is a  slight predominance of B cells by CD20 and CD3 immunohistochemistry.  The B cells appear to co-express CD79a, CD10, BCL6 and Bcl-2 both within  follicles and within the interfollicular spaces. CD23 shows expanded  dendritic meshworks. The B-cells are negative for CD5 and cyclin D1.  Flow cytometry identified a kappa-restricted monoclonal B-cell  population expressing CD10 (42% of all lymphocytes; See [id number]).  Overall, the findings are consistent with a non-Hodgkin B-cell lymphoma,  most likely follicular lymphoma.  FISH for t(14;18) is pending and will  be reported in an addendum along with ki-67.  Unfortunately, given the  limited and fragmented nature of the sample accurate grading of the  lymphoma is not possible on the sample provided.   SURGICAL PATHOLOGY Surgical Pathology   THIS IS AN ADDENDUM REPORT   CASE: [id number]  PATIENT: Liz Bremner  Bone Marrow Report  Addendum    Reason for Addendum #1:  Molecular Add-on   Clinical History: Follicular lymphoma of lymphoma nodes of multiple  regions, unspecified grade (HCC)  ,( BH)    DIAGNOSIS:   BONE MARROW, ASPIRATE, CLOT,  CORE:  -  Hypercellular marrow involved by follicular lymphoma  -  See microscopic description and comment below   PERIPHERAL BLOOD:  -  Normocytic anemia  -  See complete blood cell count   COMMENT:   The combined morphologic and flow cytometric features are consistent  with bone marrow involvement by the patient's previously diagnosed  follicular lymphoma.  Correlation with cytogenetics is recommended.    ADDENDUM:   ** Please note this testing was performed and interpreted by an outside  facility ([facility]).  This addendum is only being added to provide a  summary of the results for report completeness.  Please see electronic  medical record for a copy of the full report. **   Karyotype:  46,X,t(X;18)(q13;q11.2),+der(X)t(X;18),add(6)(q12),-11,t(12;13)(q24.1;q1  2),t(14;18)(q32;q21)[2]/46,XX[18]   Interpretation:  ABNORMAL FEMALE KARYOTYPE   The abnormalities include loss of material from the long arm of  chromosome 6 and the t(14;18)(q32;q21) - both recurring abnormalities in  lymphomas. The t(14;18)(q32;q21.3), involving the IGH gene at 14q32.3  and the BCL2 gene at 18q21.3, is observed in 90% of follicular  lymphomas.    RADIOGRAPHIC STUDIES: I have personally reviewed the radiological images as listed and agreed with the findings in the report. No results found.  ASSESSMENT & PLAN:   72 year old female with history of seizure disorder with  1) Recently diagnosed stage IVa follicular lymphoma PET/CT reviewed as noted above  2) history of seizure disorder on Lamictal  3) normocytic anemia could be from lymphoma plus her recent pneumonia hemoglobin is improved to 11.7 from 11.2.  PLAN: -Patient has no clinical symptoms or signs of significant follicular lymphoma progression at this time. -Patient has no significant symptomatic follicular lymphoma progression at this time -No acute new indications to treat her low-grade follicular lymphoma at this time and  patient prefers to take a more conservative approach. -Discussed lab results from today, 12/27/2022, with the patient. CBC shows slightly decreased hemoglobin at 11.2 K and slightly decreased hematocrit at 34.6%. CMP is pending.  -Recommended to start multi-vitamin supplement or vitamin B-12 supplement.  -Answered all of patient's questions.    FOLLOW-UP: RTC with Dr Candise Che with labs in 6 months  The total time spent in the appointment was 20 minutes* .  All of the patient's questions were answered with apparent satisfaction. The patient knows to call the clinic with any problems, questions or concerns.   Wyvonnia Lora MD MS AAHIVMS [facility] Hematology/Oncology Physician [facility]  .*  Total Encounter Time as defined by the Centers for Medicare and Medicaid Services includes, in addition to the face-to-face time of a patient visit (documented in the note above) non-face-to-face time: obtaining and reviewing outside history, ordering and reviewing medications, tests or procedures, care coordination (communications with other health care professionals or caregivers) and documentation in the medical record.   I, Ok Edwards, acting as a Neurosurgeon for Wyvonnia Lora, MD.,have documented all relevant documentation on the behalf of Wyvonnia Lora, MD,as directed by  Wyvonnia Lora, MD while in the presence of Wyvonnia Lora, MD.  .I have reviewed the above documentation for accuracy and completeness, and I agree with the above. Johney Maine MD

## 2023-01-06 ENCOUNTER — Telehealth: Payer: Self-pay | Admitting: Hematology

## 2023-01-06 NOTE — Telephone Encounter (Signed)
Left a message in regards to upcoming appointment times/dates

## 2023-01-07 ENCOUNTER — Encounter: Payer: Self-pay | Admitting: Gastroenterology

## 2023-01-21 NOTE — Telephone Encounter (Signed)
Last Read in MyChart 01/12/2023 10:35 PM by Melinda Crutch

## 2023-01-26 ENCOUNTER — Ambulatory Visit: Payer: Medicare Other | Admitting: Adult Health

## 2023-02-03 NOTE — Telephone Encounter (Signed)
Left message to call back to discuss Leqvio 

## 2023-02-06 ENCOUNTER — Other Ambulatory Visit (HOSPITAL_BASED_OUTPATIENT_CLINIC_OR_DEPARTMENT_OTHER): Payer: Self-pay | Admitting: Internal Medicine

## 2023-02-10 ENCOUNTER — Other Ambulatory Visit: Payer: Self-pay | Admitting: Internal Medicine

## 2023-02-10 DIAGNOSIS — E7849 Other hyperlipidemia: Secondary | ICD-10-CM | POA: Insufficient documentation

## 2023-02-10 NOTE — Telephone Encounter (Signed)
No response from patient via MyChart or phone. Will go ahead and order referral to infusion center and await response

## 2023-02-11 ENCOUNTER — Telehealth: Payer: Self-pay | Admitting: Pharmacy Technician

## 2023-02-11 NOTE — Telephone Encounter (Signed)
Katie Mcneil note:  Patient will be scheduled as soon as possible.  Auth Submission: NO AUTH NEEDED Site of care: Site of care: [facility] Payer: MEDICARE A/B & AARP Medication & CPT/J Code(s) submitted: Leqvio (Inclisiran) [phone] Route of submission (phone, fax, portal):  Phone # Fax # Auth type: Buy/Bill PB Units/visits requested: X1 Reference number:  Approval from: 02/11/23 to 07/14/23    Medicare will cover 80% and AARP will pick-up remaining 20%

## 2023-02-14 NOTE — Progress Notes (Unsigned)
PATIENT: Katie Mcneil DOB: Dec 30, 1950  REASON FOR VISIT: follow up HISTORY FROM: patient  No chief complaint on file.    HISTORY OF PRESENT ILLNESS: Today 02/14/23: Katie Mcneil is a 72 y.o. female with a history of seizures. Returns today for follow-up. Denies any seizure events. Remains on Lamictal ER 300 mg daily.      01/20/22: Katie Mcneil is a 72 year old female with a history of seizures.  She returns today for follow-up.  She remains on Lamictal extended release 300 mg daily.  Denies any seizure events.Reports that retired in [place]. No changes with her gait or balance. Larey Seat last September 2022 but thinks it was because she was moving too fast trying to get ready.  Reports that she has been diagnosed with lymphoma.  Currently they are just monitoring.  She returns today for an evaluation.  01/14/21: Katie Mcneil is a 72 year female with a history of seizures.  She returns today for follow-up.  She continues on Lamictal extended release 300 mg daily.  She denies any seizure events.  Overall she feels that she is tolerating the medication well.  Denies any new symptoms.  No changes with her gait or balance.  Recently had blood work with her PCP  01/15/20: Katie Mcneil Is a 71 year old female with a hist4ory of seizures.  She returns today for follow-up.  She remains on Lamictal extended release 300 mg daily.  She denies any seizure events.  Denies any changes with her gait or balance.  She does report that she is under a lot of stress right now with her daughter who is suffering from mental illness.  She also feels that she may have ADD but this has not been formally diagnosed.  HISTORY 12/14/18:   Katie Mcneil is a 72 year old female with a history of seizures.  She returns today for follow-up.  She remains on Lamictal 150 mg twice a day.  She is able to complete all ADLs independently.  She operates a Librarian, academic without difficulty.  She continues to work full-time.  She states that  when she takes the full dose of Lamictal at bedtime she feels drowsy the next morning.  She states that there is been times that she only took half a dose and she felt better the next morning.  She has not had any seizure events.  She reports that she has headaches but she feels that this is sinus related.  Reports that she has nasal congestion daily.  She returns today for follow-up  REVIEW OF SYSTEMS: Out of a complete 14 system review of symptoms, the patient complains only of the following symptoms, and all other reviewed systems are negative.  See HPI ALLERGIES: No Known Allergies  HOME MEDICATIONS: Outpatient Medications Prior to Visit  Medication Sig Dispense Refill   atorvastatin (LIPITOR) 40 MG tablet TAKE 1 TABLET BY MOUTH EVERY DAY 90 tablet 0   B Complex-C (VITAMIN B + C COMPLEX) TABS      cetirizine (ZYRTEC) 10 MG tablet Take 10 mg by mouth daily.     LamoTRIgine 300 MG TB24 24 hour tablet Take 1 tablet (300 mg total) by mouth daily. 90 tablet 3   Omega-3 Fatty Acids (FISH OIL) 1000 MG CAPS Take 1 capsule by mouth daily in the afternoon.     Vitamin D, Cholecalciferol, 25 MCG (1000 UT) CAPS Take 1 capsule by mouth daily in the afternoon.     No facility-administered medications prior to visit.  PAST MEDICAL HISTORY: Past Medical History:  Diagnosis Date   Dyslipidemia    Lymphoma (HCC) 03/2021   Seizures (HCC)     PAST SURGICAL HISTORY: Past Surgical History:  Procedure Laterality Date   CATARACT EXTRACTION W/ INTRAOCULAR LENS IMPLANT Bilateral 2019   CESAREAN SECTION  1981, 1990   NO PAST SURGERIES     TUBAL LIGATION      FAMILY HISTORY: Family History  Problem Relation Age of Onset   Neuropathy Mother    Anuerysm Father    Leukemia Sister 54   Alzheimer's disease Sister 69   Dementia Brother    Heart attack Maternal Grandmother    CVA Maternal Grandfather    Dementia Paternal Grandmother    Seizures Daughter    Other Daughter        Psychological  disorder   Colon cancer Neg Hx     SOCIAL HISTORY: Social History   Socioeconomic History   Marital status: Widowed    Spouse name: Not on file   Number of children: 2   Years of education: Not on file   Highest education level: Not on file  Occupational History   Not on file  Tobacco Use   Smoking status: Never   Smokeless tobacco: Never  Vaping Use   Vaping status: Never Used  Substance and Sexual Activity   Alcohol use: Not Currently    Alcohol/week: 0.0 standard drinks of alcohol   Drug use: No   Sexual activity: Not Currently  Other Topics Concern   Not on file  Social History Narrative   Retired from working in Education officer, environmental at a [employer]   Lives with daughter (who has mental health issues)   Has two children   Widowed   Social Determinants of Health   Financial Resource Strain: Low Risk  (05/10/2022)   Overall Financial Resource Strain (CARDIA)    Difficulty of Paying Living Expenses: Not hard at all  Food Insecurity: No Food Insecurity (05/10/2022)   Hunger Vital Sign    Worried About Running Out of Food in the Last Year: Never true    Ran Out of Food in the Last Year: Never true  Transportation Needs: No Transportation Needs (05/10/2022)   PRAPARE - Administrator, Civil Service (Medical): No    Lack of Transportation (Non-Medical): No  Physical Activity: Insufficiently Active (05/10/2022)   Exercise Vital Sign    Days of Exercise per Week: 3 days    Minutes of Exercise per Session: 30 min  Stress: No Stress Concern Present (05/10/2022)   [employer] of Occupational Health - Occupational Stress Questionnaire    Feeling of Stress : Not at all  Social Connections: Moderately Integrated (05/10/2022)   Social Connection and Isolation Panel [NHANES]    Frequency of Communication with Friends and Family: More than three times a week    Frequency of Social Gatherings with Friends and Family: More than three times a week    Attends Religious  Services: More than 4 times per year    Active Member of [employer] or Organizations: Yes    Attends Banker Meetings: 1 to 4 times per year    Marital Status: Widowed  Intimate Partner Violence: Not At Risk (05/10/2022)   Humiliation, Afraid, Rape, and Kick questionnaire    Fear of Current or Ex-Partner: No    Emotionally Abused: No    Physically Abused: No    Sexually Abused: No      PHYSICAL EXAM  There were no vitals filed for this visit.  There is no height or weight on file to calculate BMI.  Generalized: Well developed, in no acute distress   Neurological examination  Mentation: Alert oriented to time, place, history taking. Follows all commands speech and language fluent Cranial nerve II-XII: Pupils were equal round reactive to light. Extraocular movements were full, visual field were full on confrontational test. . Head turning and shoulder shrug  were normal and symmetric. Motor: The motor testing reveals 5 over 5 strength of all 4 extremities. Good symmetric motor tone is noted throughout.  Sensory: Sensory testing is intact to soft touch on all 4 extremities. No evidence of extinction is noted.  Coordination: Cerebellar testing reveals good finger-nose-finger and heel-to-shin bilaterally.  Gait and station: Gait is normal.  Tandem gait is slightly unsteady.  Romberg is negative   DIAGNOSTIC DATA (LABS, IMAGING, TESTING) - I reviewed patient records, labs, notes, testing and imaging myself where available.  Lab Results  Component Value Date   WBC 5.2 12/27/2022   HGB 11.2 (L) 12/27/2022   HCT 34.6 (L) 12/27/2022   MCV 86.5 12/27/2022   PLT 237 12/27/2022      Component Value Date/Time   NA 141 12/27/2022 1233   NA 146 (H) 12/14/2018 1017   K 4.1 12/27/2022 1233   CL 109 12/27/2022 1233   CO2 25 12/27/2022 1233   GLUCOSE 87 12/27/2022 1233   BUN 11 12/27/2022 1233   BUN 9 12/14/2018 1017   CREATININE 0.67 12/27/2022 1233   CALCIUM 9.4 12/27/2022  1233   PROT 7.0 12/27/2022 1233   PROT 7.0 12/14/2018 1017   ALBUMIN 4.5 12/27/2022 1233   ALBUMIN 4.6 12/14/2018 1017   AST 24 12/27/2022 1233   ALT 19 12/27/2022 1233   ALKPHOS 76 12/27/2022 1233   BILITOT 0.8 12/27/2022 1233   GFRNONAA >60 12/27/2022 1233   GFRAA 92 12/14/2018 1017    Lab Results  Component Value Date   VITAMINB12 789 04/02/2021   Lab Results  Component Value Date   TSH 1.721 03/24/2021      ASSESSMENT AND PLAN 72 y.o. year old female  has a past medical history of Dyslipidemia, Lymphoma (HCC) (03/2021), and Seizures (HCC). here with:  1.  Seizures  Continue Lamictal extended release 300 mg daily Check Lamictal level today Advised if she has any seizure event she should let us know Follow-up in 1 year or sooner if needed  Butch Penny, MSN, NP-C 02/14/2023, 10:41 AM [facility] [address] [place], [place] [address] [phone]

## 2023-02-15 ENCOUNTER — Encounter: Payer: Self-pay | Admitting: Adult Health

## 2023-02-15 ENCOUNTER — Ambulatory Visit (INDEPENDENT_AMBULATORY_CARE_PROVIDER_SITE_OTHER): Payer: Medicare Other | Admitting: Adult Health

## 2023-02-15 VITALS — BP 112/65 | HR 70 | Ht 66.0 in | Wt 177.0 lb

## 2023-02-15 DIAGNOSIS — R569 Unspecified convulsions: Secondary | ICD-10-CM

## 2023-02-15 DIAGNOSIS — Z5181 Encounter for therapeutic drug level monitoring: Secondary | ICD-10-CM | POA: Diagnosis not present

## 2023-02-15 NOTE — Patient Instructions (Signed)
Your Plan:  Continue Lamictal  Blood work today If your symptoms worsen or you develop new symptoms please let us know.    Thank you for coming to see us at [facility]. I hope we have been able to provide you high quality care today.  You may receive a patient satisfaction survey over the next few weeks. We would appreciate your feedback and comments so that we may continue to improve ourselves and the health of our patients.  

## 2023-02-16 ENCOUNTER — Ambulatory Visit: Payer: Medicare Other

## 2023-02-16 VITALS — Ht 66.0 in | Wt 175.0 lb

## 2023-02-16 DIAGNOSIS — Z1211 Encounter for screening for malignant neoplasm of colon: Secondary | ICD-10-CM

## 2023-02-16 LAB — LAMOTRIGINE LEVEL: Lamotrigine Lvl: 9.8 ug/mL (ref 2.0–20.0)

## 2023-02-16 MED ORDER — NA SULFATE-K SULFATE-MG SULF 17.5-3.13-1.6 GM/177ML PO SOLN
1.0000 | Freq: Once | ORAL | 0 refills | Status: AC
Start: 2023-02-16 — End: 2023-02-16

## 2023-02-16 NOTE — Progress Notes (Signed)
No egg or soy allergy known to patient  No issues known to pt with past sedation with any surgeries or procedures Patient denies ever being told they had issues or difficulty with intubation  No FH of Malignant Hyperthermia Pt is not on diet pills Pt is not on  home 02  Pt is not on blood thinners  Pt denies issues with constipation  No A fib or A flutter Have any cardiac testing pending--no  LOA: independent  Prep: suprep   Patient's chart reviewed by Katie Mcneil CNRA prior to previsit and patient appropriate for the [facility].  Previsit completed and red dot placed by patient's name on their procedure day (on provider's schedule).     PV competed with patient. Prep instructions sent via mychart and home address. Goodrx coupon for [facility] provided to use for price reduction if needed.   

## 2023-03-04 ENCOUNTER — Other Ambulatory Visit: Payer: Self-pay | Admitting: Adult Health

## 2023-03-07 NOTE — Telephone Encounter (Signed)
Rx refilled per last office visit note.

## 2023-03-10 ENCOUNTER — Encounter: Payer: Medicare Other | Admitting: Gastroenterology

## 2023-03-31 ENCOUNTER — Encounter: Payer: Self-pay | Admitting: Gastroenterology

## 2023-03-31 ENCOUNTER — Ambulatory Visit: Payer: Medicare Other | Admitting: Gastroenterology

## 2023-03-31 VITALS — BP 116/65 | HR 72 | Temp 98.7°F | Resp 10 | Ht 66.0 in | Wt 175.0 lb

## 2023-03-31 DIAGNOSIS — Z1211 Encounter for screening for malignant neoplasm of colon: Secondary | ICD-10-CM | POA: Diagnosis not present

## 2023-03-31 DIAGNOSIS — D123 Benign neoplasm of transverse colon: Secondary | ICD-10-CM | POA: Diagnosis not present

## 2023-03-31 HISTORY — PX: COLONOSCOPY WITH PROPOFOL: SHX5780

## 2023-03-31 MED ORDER — SODIUM CHLORIDE 0.9 % IV SOLN: 500.0000 mL | Freq: Once | INTRAVENOUS | Status: DC

## 2023-03-31 NOTE — Op Note (Signed)
[facility] Patient Name: Katie Mcneil Procedure Date: 03/31/2023 2:22 PM MRN: [id number] Endoscopist: Meryl Dare , MD, [phone] Age: 72 Referring MD:  Date of Birth: [birth year] Gender: Female Account #: [id number] Procedure:                Colonoscopy Indications:              Screening for colorectal malignant neoplasm Medicines:                Monitored Anesthesia Care Procedure:                Pre-Anesthesia Assessment:                           - Prior to the procedure, a History and Physical                            was performed, and patient medications and                            allergies were reviewed. The patient's tolerance of                            previous anesthesia was also reviewed. The risks                            and benefits of the procedure and the sedation                            options and risks were discussed with the patient.                            All questions were answered, and informed consent                            was obtained. Prior Anticoagulants: The patient has                            taken no anticoagulant or antiplatelet agents. ASA                            Grade Assessment: II - A patient with mild systemic                            disease. After reviewing the risks and benefits,                            the patient was deemed in satisfactory condition to                            undergo the procedure.                           After obtaining informed consent, the colonoscope  was passed under direct vision. Throughout the                            procedure, the patient's blood pressure, pulse, and                            oxygen saturations were monitored continuously. The                            CF [id number] #[id number] was introduced through the anus                            and advanced to the the cecum, identified by                            appendiceal  orifice and ileocecal valve. The                            ileocecal valve, appendiceal orifice, and rectum                            were photographed. The quality of the bowel                            preparation was good. The colonoscopy was performed                            without difficulty. The patient tolerated the                            procedure well. Scope In: 2:42:41 PM Scope Out: 3:01:23 PM Scope Withdrawal Time: 0 hours 12 minutes 18 seconds  Total Procedure Duration: 0 hours 18 minutes 42 seconds  Findings:                 The perianal and digital rectal examinations were                            normal.                           Three sessile polyps were found in the transverse                            colon (2) and hepatic flexure (1). The polyps were                            5 to 7 mm in size. These polyps were removed with a                            cold snare. Resection and retrieval were complete.                           The exam was otherwise without abnormality on  direct and retroflexion views. Complications:            No immediate complications. Estimated blood loss:                            None. Estimated Blood Loss:     Estimated blood loss: none. Impression:               - Three 5 to 7 mm polyps in the transverse colon                            and at the hepatic flexure, removed with a cold                            snare. Resected and retrieved.                           - The examination was otherwise normal on direct                            and retroflexion views. Recommendation:           - Repeat colonoscopy vs no repeat due to age after                            studies are complete for surveillance.                           - Patient has a contact number available for                            emergencies. The signs and symptoms of potential                            delayed complications  were discussed with the                            patient. Return to normal activities tomorrow.                            Written discharge instructions were provided to the                            patient.                           - Resume previous diet.                           - Continue present medications.                           - Await pathology results. Meryl Dare, MD 03/31/2023 3:04:26 PM This report has been signed electronically.

## 2023-03-31 NOTE — Progress Notes (Signed)
Vss nad trans to pacu 

## 2023-03-31 NOTE — Progress Notes (Signed)
History & Physical  Primary Care Physician:  Jarold Motto, [name] Primary Gastroenterologist: Claudette Head, MD  Impression / Plan:  Average risk CRC screening for colonoscopy.  CHIEF COMPLAINT:  CRC screening  HPI: Katie Mcneil is a 72 y.o. female average risk CRC screening for colonoscopy.    Past Medical History:  Diagnosis Date   Dyslipidemia    Lymphoma (HCC) 03/2021   Seizures (HCC)     Past Surgical History:  Procedure Laterality Date   CATARACT EXTRACTION W/ INTRAOCULAR LENS IMPLANT Bilateral 2019   CESAREAN SECTION  1981, 1990   [facility]OSCOPY  2012   [facility]- [facility]   COLONOSCOPY WITH PROPOFOL  03/31/2023   [facility] at [facility]   NO PAST SURGERIES     TUBAL LIGATION      Prior to Admission medications   Medication Sig Start Date End Date Taking? Authorizing Provider  atorvastatin (LIPITOR) 40 MG tablet TAKE 1 TABLET BY MOUTH EVERY DAY 02/07/23   Hilty, Lisette Abu, MD  B Complex-C (VITAMIN B + C COMPLEX) TABS  05/14/21   [provider]  cetirizine (ZYRTEC) 10 MG tablet Take 10 mg by mouth daily.    [provider]  LamoTRIgine 300 MG TB24 24 hour tablet TAKE 1 TABLET BY MOUTH EVERY DAY 03/07/23   Butch Penny, NP  Omega-3 Fatty Acids (FISH OIL) 1000 MG CAPS Take 1 capsule by mouth daily in the afternoon.    [provider]  Vitamin D, Cholecalciferol, 25 MCG (1000 UT) CAPS Take 1 capsule by mouth daily in the afternoon.    [provider]    Current Outpatient Medications  Medication Sig Dispense Refill   atorvastatin (LIPITOR) 40 MG tablet TAKE 1 TABLET BY MOUTH EVERY DAY 90 tablet 0   B Complex-C (VITAMIN B + C COMPLEX) TABS      cetirizine (ZYRTEC) 10 MG tablet Take 10 mg by mouth daily.     LamoTRIgine 300 MG TB24 24 hour tablet TAKE 1 TABLET BY MOUTH EVERY DAY 90 tablet 3   Omega-3 Fatty Acids (FISH OIL) 1000 MG CAPS Take 1 capsule by mouth daily in the afternoon.     Vitamin D, Cholecalciferol, 25 MCG (1000 UT) CAPS  Take 1 capsule by mouth daily in the afternoon.     Current Facility-Administered Medications  Medication Dose Route Frequency Provider Last Rate Last Admin   0.9 %  sodium chloride infusion  500 mL Intravenous Once Meryl Dare, MD        Allergies as of 03/31/2023   (No Known Allergies)    Family History  Problem Relation Age of Onset   Neuropathy Mother    Anuerysm Father    Leukemia Sister 50   Alzheimer's disease Sister 72   Dementia Brother    Heart attack Maternal Grandmother    CVA Maternal Grandfather    Dementia Paternal Grandmother    Seizures Daughter    Other Daughter        Psychological disorder   Colon cancer Neg Hx    Colon polyps Neg Hx    Esophageal cancer Neg Hx    Rectal cancer Neg Hx    Stomach cancer Neg Hx     Social History   Socioeconomic History   Marital status: Widowed    Spouse name: Not on file   Number of children: 2   Years of education: Not on file   Highest education level: Not on file  Occupational History   Not  on file  Tobacco Use   Smoking status: Never   Smokeless tobacco: Never  Vaping Use   Vaping status: Never Used  Substance and Sexual Activity   Alcohol use: Not Currently    Alcohol/week: 0.0 standard drinks of alcohol   Drug use: No   Sexual activity: Not Currently    Birth control/protection: Post-menopausal  Other Topics Concern   Not on file  Social History Narrative   Retired from working in Education officer, environmental at a [employer]   Lives with daughter (who has mental health issues)   Has two children   Widowed   Social Determinants of Health   Financial Resource Strain: Low Risk  (05/10/2022)   Overall Financial Resource Strain (CARDIA)    Difficulty of Paying Living Expenses: Not hard at all  Food Insecurity: No Food Insecurity (05/10/2022)   Hunger Vital Sign    Worried About Running Out of Food in the Last Year: Never true    Ran Out of Food in the Last Year: Never true  Transportation Needs: No  Transportation Needs (05/10/2022)   PRAPARE - Administrator, Civil Service (Medical): No    Lack of Transportation (Non-Medical): No  Physical Activity: Insufficiently Active (05/10/2022)   Exercise Vital Sign    Days of Exercise per Week: 3 days    Minutes of Exercise per Session: 30 min  Stress: No Stress Concern Present (05/10/2022)   [employer] of Occupational Health - Occupational Stress Questionnaire    Feeling of Stress : Not at all  Social Connections: Moderately Integrated (05/10/2022)   Social Connection and Isolation Panel [NHANES]    Frequency of Communication with Friends and Family: More than three times a week    Frequency of Social Gatherings with Friends and Family: More than three times a week    Attends Religious Services: More than 4 times per year    Active Member of [employer] or Organizations: Yes    Attends Banker Meetings: 1 to 4 times per year    Marital Status: Widowed  Intimate Partner Violence: Not At Risk (05/10/2022)   Humiliation, Afraid, Rape, and Kick questionnaire    Fear of Current or Ex-Partner: No    Emotionally Abused: No    Physically Abused: No    Sexually Abused: No    Review of Systems:  All systems reviewed were negative except where noted in HPI.   Physical Exam:  General:  Alert, well-developed, in NAD Head:  Normocephalic and atraumatic. Eyes:  Sclera clear, no icterus.   Conjunctiva pink. Ears:  Normal auditory acuity. Mouth:  No deformity or lesions.  Neck:  Supple; no masses. Lungs:  Clear throughout to auscultation.   No wheezes, crackles, or rhonchi.  Heart:  Regular rate and rhythm; no murmurs. Abdomen:  Soft, nondistended, nontender. No masses, hepatomegaly. No palpable masses.  Normal bowel sounds.    Rectal:  Deferred   Msk:  Symmetrical without gross deformities. Extremities:  Without edema. Neurologic:  Alert and  oriented x 4; grossly normal neurologically. Skin:  Intact without  significant lesions or rashes. Psych:  Alert and cooperative. Normal mood and affect.   Venita Lick. Russella Dar  03/31/2023, 2:29 PM See Loretha Stapler, [facility], to contact our on call provider

## 2023-03-31 NOTE — Progress Notes (Signed)
Called to room to assist during endoscopic procedure.  Patient ID and intended procedure confirmed with present staff. Received instructions for my participation in the procedure from the performing physician.  

## 2023-03-31 NOTE — Patient Instructions (Signed)
-   Resume previous diet - Continue present medications. - Await pathology results - Patient given educational handouts related to procedure.  YOU HAD AN ENDOSCOPIC PROCEDURE TODAY AT THE [facility]:   Refer to the procedure report that was given to you for any specific questions about what was found during the examination.  If the procedure report does not answer your questions, please call your gastroenterologist to clarify.  If you requested that your care partner not be given the details of your procedure findings, then the procedure report has been included in a sealed envelope for you to review at your convenience later.  YOU SHOULD EXPECT: Some feelings of bloating in the abdomen. Passage of more gas than usual.  Walking can help get rid of the air that was put into your GI tract during the procedure and reduce the bloating. If you had a lower endoscopy (such as a colonoscopy or flexible sigmoidoscopy) you may notice spotting of blood in your stool or on the toilet paper. If you underwent a bowel prep for your procedure, you may not have a normal bowel movement for a few days.  Please Note:  You might notice some irritation and congestion in your nose or some drainage.  This is from the oxygen used during your procedure.  There is no need for concern and it should clear up in a day or so.  SYMPTOMS TO REPORT IMMEDIATELY:  Following lower endoscopy (colonoscopy or flexible sigmoidoscopy):  Excessive amounts of blood in the stool  Significant tenderness or worsening of abdominal pains  Swelling of the abdomen that is new, acute  Fever of 100F or higher   For urgent or emergent issues, a gastroenterologist can be reached at any hour by calling [phone]. Do not use MyChart messaging for urgent concerns.    DIET:  We do recommend a small meal at first, but then you may proceed to your regular diet.  Drink plenty of fluids but you should avoid alcoholic beverages for 24  hours.  ACTIVITY:  You should plan to take it easy for the rest of today and you should NOT DRIVE or use heavy machinery until tomorrow (because of the sedation medicines used during the test).    FOLLOW UP: Our staff will call the number listed on your records the next business day following your procedure.  We will call around 7:15- 8:00 am to check on you and address any questions or concerns that you may have regarding the information given to you following your procedure. If we do not reach you, we will leave a message.     If any biopsies were taken you will be contacted by phone or by letter within the next 1-3 weeks.  Please call us at [phone] if you have not heard about the biopsies in 3 weeks.    SIGNATURES/CONFIDENTIALITY: You and/or your care partner have signed paperwork which will be entered into your electronic medical record.  These signatures attest to the fact that that the information above on your After Visit Summary has been reviewed and is understood.  Full responsibility of the confidentiality of this discharge information lies with you and/or your care-partner.

## 2023-04-01 ENCOUNTER — Telehealth: Payer: Self-pay | Admitting: *Deleted

## 2023-04-01 NOTE — Telephone Encounter (Signed)
  Follow up Call-     03/31/2023    2:22 PM  Call back number  Post procedure Call Back phone  # [phone]  Permission to leave phone message Yes     Patient questions:  Do you have a fever, pain , or abdominal swelling? No. Pain Score  0 *  Have you tolerated food without any problems? Yes.    Have you been able to return to your normal activities? Yes.    Do you have any questions about your discharge instructions: Diet   No. Medications  No. Follow up visit  No.  Do you have questions or concerns about your Care? No.  Actions: * If pain score is 4 or above: No action needed, pain <4.

## 2023-04-05 ENCOUNTER — Ambulatory Visit (INDEPENDENT_AMBULATORY_CARE_PROVIDER_SITE_OTHER): Payer: Medicare Other

## 2023-04-05 DIAGNOSIS — Z23 Encounter for immunization: Secondary | ICD-10-CM

## 2023-04-05 LAB — SURGICAL PATHOLOGY

## 2023-04-06 ENCOUNTER — Encounter: Payer: Self-pay | Admitting: Gastroenterology

## 2023-05-09 LAB — HM MAMMOGRAPHY

## 2023-05-11 ENCOUNTER — Other Ambulatory Visit (HOSPITAL_BASED_OUTPATIENT_CLINIC_OR_DEPARTMENT_OTHER): Payer: Self-pay | Admitting: Internal Medicine

## 2023-05-16 ENCOUNTER — Ambulatory Visit (INDEPENDENT_AMBULATORY_CARE_PROVIDER_SITE_OTHER): Payer: Medicare Other

## 2023-05-16 VITALS — Wt 175.0 lb

## 2023-05-16 DIAGNOSIS — Z0000 Encounter for general adult medical examination without abnormal findings: Secondary | ICD-10-CM | POA: Diagnosis not present

## 2023-05-16 NOTE — Patient Instructions (Signed)
Katie Mcneil , Thank you for taking time to come for your Medicare Wellness Visit. I appreciate your ongoing commitment to your health goals. Please review the following plan we discussed and let me know if I can assist you in the future.   Referrals/Orders/Follow-Ups/Clinician Recommendations: Aim for 30 minutes of exercise or brisk walking, 6-8 glasses of water, and 5 servings of fruits and vegetables each day.   This is a list of the screening recommended for you and due dates:  Health Maintenance  Topic Date Due   Zoster (Shingles) Vaccine (1 of 2) Never done   Mammogram  02/12/2021   Medicare Annual Wellness Visit  05/11/2023   Colon Cancer Screening  03/30/2026   DTaP/Tdap/Td vaccine (2 - Td or Tdap) 09/11/2030   Pneumonia Vaccine  Completed   Flu Shot  Completed   DEXA scan (bone density measurement)  Completed   Hepatitis C Screening  Completed   HPV Vaccine  Aged Out   COVID-19 Vaccine  Discontinued    Advanced directives: (Copy Requested) Please bring a copy of your health care power of attorney and living will to the office to be added to your chart at your convenience.  Next Medicare Annual Wellness Visit scheduled for next year: Yes

## 2023-05-16 NOTE — Progress Notes (Signed)
Subjective:   Katie Mcneil is a 72 y.o. female who presents for Medicare Annual (Subsequent) preventive examination.  Visit Complete: Virtual I connected with  Melinda Crutch on 05/16/23 by a audio enabled telemedicine application and verified that I am speaking with the correct person using two identifiers.  Patient Location: Home  Provider Location: Office/Clinic  I discussed the limitations of evaluation and management by telemedicine. The patient expressed understanding and agreed to proceed.  Vital Signs: Because this visit was a virtual/telehealth visit, some criteria may be missing or patient reported. Any vitals not documented were not able to be obtained and vitals that have been documented are patient reported.  Patient Medicare AWV questionnaire was completed by the patient on 05/16/23; I have confirmed that all information answered by patient is correct and no changes since this date.  Cardiac Risk Factors include: advanced age (>79men, >22 women);dyslipidemia     Objective:    Today's Vitals   05/16/23 1514  Weight: 175 lb (79.4 kg)   Body mass index is 28.25 kg/m.     05/16/2023    3:17 PM 05/10/2022    3:04 PM 05/11/2021    7:26 AM 03/24/2021    6:02 PM 03/23/2021    3:22 PM 03/14/2021    9:32 PM  Advanced Directives  Does Patient Have a Medical Advance Directive? Yes Yes Yes Yes Yes No;Yes  Type of Estate agent of [place];Living will Healthcare Power of [place];Living will Healthcare Power of [place];Living will Living will Living will Living will  Does patient want to make changes to medical advance directive?   No - Patient declined No - Patient declined    Copy of Healthcare Power of Attorney in Chart? No - copy requested No - copy requested No - copy requested       Current Medications (verified) Outpatient Encounter Medications as of 05/16/2023  Medication Sig   atorvastatin (LIPITOR) 40 MG tablet TAKE 1 TABLET BY MOUTH  EVERY DAY   B Complex-C (VITAMIN B + C COMPLEX) TABS    cetirizine (ZYRTEC) 10 MG tablet Take 10 mg by mouth daily.   LamoTRIgine 300 MG TB24 24 hour tablet TAKE 1 TABLET BY MOUTH EVERY DAY   Omega-3 Fatty Acids (FISH OIL) 1000 MG CAPS Take 1 capsule by mouth daily in the afternoon.   Vitamin D, Cholecalciferol, 25 MCG (1000 UT) CAPS Take 1 capsule by mouth daily in the afternoon.   No facility-administered encounter medications on file as of 05/16/2023.    Allergies (verified) Patient has no known allergies.   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   Seizures (HCC) 2007   2007-last   Past Surgical History:  Procedure Laterality Date   CATARACT EXTRACTION W/ INTRAOCULAR LENS IMPLANT Bilateral 2019   CESAREAN SECTION  1981, 1990   [facility]OSCOPY  2012   [facility]- [facility]   COLONOSCOPY WITH PROPOFOL  03/31/2023   [facility]   NO PAST SURGERIES     TUBAL LIGATION     Family History  Problem Relation Age of Onset   Neuropathy Mother    Anuerysm Father    Leukemia Sister 19   Alzheimer's disease Sister 12   Dementia Brother    Heart attack Maternal Grandmother    CVA Maternal Grandfather    Dementia Paternal Grandmother    Seizures Daughter    Other Daughter        Psychological disorder  Colon cancer Neg Hx    Colon polyps Neg Hx    Esophageal cancer Neg Hx    Rectal cancer Neg Hx    Stomach cancer Neg Hx    Social History   Socioeconomic History   Marital status: Widowed    Spouse name: Not on file   Number of children: 2   Years of education: Not on file   Highest education level: Not on file  Occupational History   Not on file  Tobacco Use   Smoking status: Never   Smokeless tobacco: Never  Vaping Use   Vaping status: Never Used  Substance and Sexual Activity   Alcohol use: Not Currently    Alcohol/week: 0.0 standard drinks of alcohol   Drug use: No   Sexual activity: Not Currently    Birth  control/protection: Post-menopausal  Other Topics Concern   Not on file  Social History Narrative   Retired from working in Education officer, environmental at a [employer]   Lives with daughter (who has mental health issues)   Has two children   Widowed   Social Determinants of Health   Financial Resource Strain: Low Risk  (05/16/2023)   Overall Financial Resource Strain (CARDIA)    Difficulty of Paying Living Expenses: Not hard at all  Food Insecurity: No Food Insecurity (05/16/2023)   Hunger Vital Sign    Worried About Running Out of Food in the Last Year: Never true    Ran Out of Food in the Last Year: Never true  Transportation Needs: No Transportation Needs (05/16/2023)   PRAPARE - Administrator, Civil Service (Medical): No    Lack of Transportation (Non-Medical): No  Physical Activity: Insufficiently Active (05/16/2023)   Exercise Vital Sign    Days of Exercise per Week: 2 days    Minutes of Exercise per Session: 30 min  Stress: No Stress Concern Present (05/16/2023)   [employer] of Occupational Health - Occupational Stress Questionnaire    Feeling of Stress : Only a little  Social Connections: Moderately Integrated (05/16/2023)   Social Connection and Isolation Panel [NHANES]    Frequency of Communication with Friends and Family: Three times a week    Frequency of Social Gatherings with Friends and Family: Once a week    Attends Religious Services: More than 4 times per year    Active Member of [employer] or Organizations: Yes    Attends Banker Meetings: More than 4 times per year    Marital Status: Widowed    Tobacco Counseling Counseling given: Not Answered   Clinical Intake:  Pre-visit preparation completed: Yes  Pain : No/denies pain     BMI - recorded: 28.25 Nutritional Status: BMI 25 -29 Overweight Nutritional Risks: None Diabetes: No  How often do you need to have someone help you when you read instructions, pamphlets, or other written materials  from your doctor or pharmacy?: 1 - Never  Interpreter Needed?: No  Information entered by :: Lanier Ensign, LPN   Activities of Daily Living    05/16/2023   11:46 AM  In your present state of health, do you have any difficulty performing the following activities:  Hearing? 0  Vision? 0  Difficulty concentrating or making decisions? 0  Walking or climbing stairs? 0  Dressing or bathing? 0  Doing errands, shopping? 0  Preparing Food and eating ? N  Using the Toilet? N  In the past six months, have you accidently leaked urine? N  Do you  have problems with loss of bowel control? N  Managing your Medications? N  Managing your Finances? N  Housekeeping or managing your Housekeeping? N    Patient Care Team: Jarold Motto, [name] as PCP - General (Physician Assistant)  Indicate any recent Medical Services you may have received from other than [facility] providers in the past year (date may be approximate).     Assessment:   This is a routine wellness examination for [place].  Hearing/Vision screen Hearing Screening - Comments:: Pt denies any hearing issues  Vision Screening - Comments:: Pt follows up with Dr Fredrich Birks for annual eye exams    Goals Addressed             This Visit's Progress    Patient Stated       Increase walking exercise        Depression Screen    05/16/2023    3:16 PM 05/10/2022    3:02 PM 12/03/2021    9:04 AM 09/10/2020    9:03 AM  PHQ 2/9 Scores  PHQ - 2 Score 0 0 0 0    Fall Risk    05/16/2023   11:46 AM 05/10/2022    3:05 PM 12/03/2021    9:03 AM  Fall Risk   Falls in the past year? 0 0 1  Number falls in past yr: 0 0 0  Injury with Fall? 0 0 1  Risk for fall due to : No Fall Risks Impaired vision History of fall(s);No Fall Risks  Follow up Falls prevention discussed Falls prevention discussed Falls evaluation completed    MEDICARE RISK AT HOME: Medicare Risk at Home Any stairs in or around the home?: Yes If so, are there any  without handrails?: No Home free of loose throw rugs in walkways, pet beds, electrical cords, etc?: Yes Adequate lighting in your home to reduce risk of falls?: Yes Life alert?: No Use of a cane, walker or w/c?: No Grab bars in the bathroom?: No Shower chair or bench in shower?: No Elevated toilet seat or a handicapped toilet?: Yes  TIMED UP AND GO:  Was the test performed?  No    Cognitive Function:    09/11/2014    3:32 PM  MMSE - Mini Mental State Exam  Orientation to time 5  Orientation to Place 5  Registration 3  Attention/ Calculation 4  Recall 3  Language- name 2 objects 2  Language- repeat 1  Language- follow 3 step command 3  Language- read & follow direction 1  Write a sentence 1  Copy design 1  Total score 29      02/27/2021    9:00 AM  Montreal Cognitive Assessment   Visuospatial/ Executive (0/5) 2  Naming (0/3) 3  Attention: Read list of digits (0/2) 2  Attention: Read list of letters (0/1) 1  Attention: Serial 7 subtraction starting at 100 (0/3) 3  Language: Repeat phrase (0/2) 1  Language : Fluency (0/1) 0  Abstraction (0/2) 2  Delayed Recall (0/5) 4  Orientation (0/6) 6  Total 24  Adjusted Score (based on education) 24      05/16/2023    3:18 PM 05/10/2022    3:08 PM  6CIT Screen  What Year? 0 points 0 points  What month? 0 points 0 points  What time? 0 points 0 points  Count back from 20 0 points 0 points  Months in reverse 0 points 0 points  Repeat phrase 0 points 0 points  Total  Score 0 points 0 points    Immunizations Immunization History  Administered Date(s) Administered   Fluad Quad(high Dose 65+) 03/17/2021, 03/16/2022   Fluad Trivalent(High Dose 65+) 04/05/2023   Moderna Covid-19 Vaccine Bivalent Booster 50yrs & up 03/09/2021   PFIZER(Purple Top)SARS-COV-2 Vaccination 07/30/2019, 08/27/2019   Pneumococcal Conjugate-13 04/07/2017   Pneumococcal Polysaccharide-23 09/10/2020   Tdap 09/10/2020   Unspecified SARS-COV-2  Vaccination 05/24/2020, 02/23/2021    TDAP status: Up to date  Flu Vaccine status: Up to date  Pneumococcal vaccine status: Up to date  Covid-19 vaccine status: Information provided on how to obtain vaccines.   Qualifies for Shingles Vaccine? Yes   Zostavax completed No   Shingrix Completed?: No.    Education has been provided regarding the importance of this vaccine. Patient has been advised to call insurance company to determine out of pocket expense if they have not yet received this vaccine. Advised may also receive vaccine at local pharmacy or Health Dept. Verbalized acceptance and understanding.  Screening Tests Health Maintenance  Topic Date Due   Zoster Vaccines- Shingrix (1 of 2) Never done   MAMMOGRAM  02/12/2021   Medicare Annual Wellness (AWV)  05/15/2024   Colonoscopy  03/30/2026   DTaP/Tdap/Td (2 - Td or Tdap) 09/11/2030   Pneumonia Vaccine 8+ Years old  Completed   INFLUENZA VACCINE  Completed   DEXA SCAN  Completed   Hepatitis C Screening  Completed   HPV VACCINES  Aged Out   COVID-19 Vaccine  Discontinued    Health Maintenance  Health Maintenance Due  Topic Date Due   Zoster Vaccines- Shingrix (1 of 2) Never done   MAMMOGRAM  02/12/2021    Colorectal cancer screening: Type of screening: Colonoscopy. Completed 03/31/23. Repeat every 3 years  Mammogram status: Completed 05/15/23 requested results . Repeat every year  Bone Density status: Completed 10/26/18. Results reflect: Bone density results: OSTEOPENIA. Repeat every 2 years.   Additional Screening:  Hepatitis C Screening:  Completed 04/02/21  Vision Screening: Recommended annual ophthalmology exams for early detection of glaucoma and other disorders of the eye. Is the patient up to date with their annual eye exam?  Yes  Who is the provider or what is the name of the office in which the patient attends annual eye exams? Dr Lorin Picket  If pt is not established with a provider, would they like to be  referred to a provider to establish care? No .   Dental Screening: Recommended annual dental exams for proper oral hygiene   Community Resource Referral / Chronic Care Management: CRR required this visit?  No   CCM required this visit?  No     Plan:     I have personally reviewed and noted the following in the patient's chart:   Medical and social history Use of alcohol, tobacco or illicit drugs  Current medications and supplements including opioid prescriptions. Patient is not currently taking opioid prescriptions. Functional ability and status Nutritional status Physical activity Advanced directives List of other physicians Hospitalizations, surgeries, and ER visits in previous 12 months Vitals Screenings to include cognitive, depression, and falls Referrals and appointments  In addition, I have reviewed and discussed with patient certain preventive protocols, quality metrics, and best practice recommendations. A written personalized care plan for preventive services as well as general preventive health recommendations were provided to patient.     Marzella Schlein, LPN   [id number]   After Visit Summary: (MyChart) Due to this being a telephonic visit, the after visit  summary with patients personalized plan was offered to patient via MyChart   Nurse Notes: none

## 2023-05-17 NOTE — Progress Notes (Signed)
Katie Mcneil is a 72 y.o. female and is here for a comprehensive physical exam.  HPI Health Maintenance Due  Topic Date Due   Zoster Vaccines- Shingrix (1 of 2) Never done   Acute Concerns: None  Chronic Issues: Hyperlipidemia: Managed/Compliant with 40 mg Atorvastatin daily. She is due to see Dr. Rennis Golden today to follow-up on this. Lab Results  Component Value Date   CHOL 292 (H) 12/03/2021   HDL 68.10 12/03/2021   LDLCALC 203 (H) 12/03/2021   TRIG 107.0 12/03/2021   CHOLHDL 4 12/03/2021   Epilepsy: Managed with 300 mg Lamotrigine daily - tolerating well.  Denies any seizures  Follicular lymphoma She last saw Dr. Candise Che in July of this year however she has a follow-up with him as she is having new lymphadenopathy in her left anterior neck and supraclavicular region. She has ongoing fatigue but states that this is nothing new for her.  Health Maintenance: Immunizations -- Plans to receive her Shingrix at her pharmacy. Colonoscopy -- Last done 03/31/23. Polyps removed. Repeat 2027. Mammogram -- Last done 05/09/23. Results were normal. Repeat 2025. PAP -- Last done 12/31/21. Results were normal. Bone Density -- Last done 10/26/18 (overdue since 2022). Indicates osteopenia. Diet -- Healthy overall Exercise -- Not as active  Sleep habits -- Occasionally uses Magnesium to help her sleep, otherwise good sleep quality.  Mood -- Stable  UTD with dentist? - Yes UTD with eye doctor? - No, will schedule  Weight history: Wt Readings from Last 10 Encounters:  05/19/23 174 lb 12.8 oz (79.3 kg)  05/16/23 175 lb (79.4 kg)  03/31/23 175 lb (79.4 kg)  02/16/23 175 lb (79.4 kg)  02/15/23 177 lb (80.3 kg)  12/27/22 175 lb 8 oz (79.6 kg)  12/17/22 176 lb 3.2 oz (79.9 kg)  06/28/22 168 lb 8 oz (76.4 kg)  05/14/22 169 lb (76.7 kg)  05/10/22 164 lb (74.4 kg)   Body mass index is 29.09 kg/m. No LMP recorded. Patient is postmenopausal.  Alcohol use:  reports that she does not  currently use alcohol.  Tobacco use:  Tobacco Use: Low Risk  (05/19/2023)   Patient History    Smoking Tobacco Use: Never    Smokeless Tobacco Use: Never    Passive Exposure: Not on file   Eligible for lung cancer screening? no     05/19/2023   10:48 AM  Depression screen PHQ 2/9  Decreased Interest 0  Down, Depressed, Hopeless 0  PHQ - 2 Score 0  Altered sleeping 0  Tired, decreased energy 0  Change in appetite 0  Feeling bad or failure about yourself  0  Trouble concentrating 0  Moving slowly or fidgety/restless 0  Suicidal thoughts 0  PHQ-9 Score 0  Difficult doing work/chores Not difficult at all   Other providers/specialists: Patient Care Team: Jarold Motto, [name] as PCP - General (Physician Assistant)   PMHx, SurgHx, SocialHx, Medications, and Allergies were reviewed in the Visit Navigator and updated as appropriate.   Past Medical History:  Diagnosis Date   Dyslipidemia    Lymphoma (HCC) 03/2021   Seizures (HCC) 2007   2007-last   Past Surgical History:  Procedure Laterality Date   CATARACT EXTRACTION W/ INTRAOCULAR LENS IMPLANT Bilateral 2019   CESAREAN SECTION  1981, 1990   [facility]COPY  2012   [facility]- [facility]   COLONOSCOPY WITH PROPOFOL  03/31/2023   [facility]   NO PAST SURGERIES     TUBAL LIGATION     Family History  Problem Relation Age of Onset   Neuropathy Mother    Anuerysm Father    Leukemia Sister 71   Alzheimer's disease Sister 44   Dementia Brother    Heart attack Maternal Grandmother    CVA Maternal Grandfather    Dementia Paternal Grandmother    Seizures Daughter    Other Daughter        Psychological disorder   Colon cancer Neg Hx    Colon polyps Neg Hx    Esophageal cancer Neg Hx    Rectal cancer Neg Hx    Stomach cancer Neg Hx    Social History   Tobacco Use   Smoking status: Never   Smokeless tobacco: Never  Vaping Use   Vaping status: Never Used  Substance Use Topics   Alcohol use: Not Currently     Alcohol/week: 0.0 standard drinks of alcohol   Drug use: No   Review of Systems:   ROS See pertinent positives and negatives as per the HPI.  Objective:   BP 128/76   Pulse 75   Temp (!) 97.1 F (36.2 C) (Temporal)   Ht 5\' 5"  (1.651 m)   Wt 174 lb 12.8 oz (79.3 kg)   SpO2 98%   BMI 29.09 kg/m  Body mass index is 29.09 kg/m.   General Appearance:    Alert, cooperative, no distress, appears stated age  Head:    Normocephalic, without obvious abnormality, atraumatic  Eyes:    PERRL, conjunctiva/corneas clear, EOM's intact, fundi    benign, both eyes  Ears:    Normal TM's and external ear canals, both ears  Nose:   Nares normal, septum midline, mucosa normal, no drainage    or sinus tenderness  Throat:   Lips, mucosa, and tongue normal; teeth and gums normal  Neck:   Supple, symmetrical, trachea midline, no adenopathy;    thyroid:  no enlargement/tenderness/nodules; no carotid   bruit or JVD  Back:     Symmetric, no curvature, ROM normal, no CVA tenderness  Lungs:     Clear to auscultation bilaterally, respirations unlabored  Chest Wall:    No tenderness or deformity   Heart:    Regular rate and rhythm, S1 and S2 normal, no murmur, rub or gallop  Breast Exam:    Deferred   Abdomen:     Soft, non-tender, bowel sounds active all four quadrants,    no masses, no organomegaly  Genitalia:    Deferred   Extremities:   Extremities normal, atraumatic, no cyanosis or edema  Pulses:   2+ and symmetric all extremities  Skin:   Skin color, texture, turgor normal, no rashes or lesions  Lymph nodes:   Left anterior cervical and supraclavicular LAD  Neurologic:   CNII-XII intact, normal strength, sensation and reflexes    throughout   Assessment/Plan:   Hyperlipidemia, unspecified hyperlipidemia type Update lipid panel and LPA for her upcoming appointment with Dr. Rennis Golden as she is already fasting today and has not gotten this done yet Will forward results to Dr. 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 Candise Che next week for new symptoms  I,Emily Lagle,acting as a scribe for [employer], PA.,have documented all relevant documentation on the behalf of Jarold Motto, PA,as directed by  Jarold Motto, PA while in the presence of  Jarold Motto, PA.  I, Jarold Motto, [name], have reviewed all documentation for this visit. The documentation on 05/19/23 for the exam, diagnosis, procedures, and orders are all accurate and complete.  Jarold Motto, PA-C [facility]

## 2023-05-19 ENCOUNTER — Ambulatory Visit (INDEPENDENT_AMBULATORY_CARE_PROVIDER_SITE_OTHER): Payer: Medicare Other | Admitting: Physician Assistant

## 2023-05-19 ENCOUNTER — Ambulatory Visit: Payer: Medicare Other | Admitting: Internal Medicine

## 2023-05-19 VITALS — BP 128/76 | HR 75 | Temp 97.1°F | Ht 65.0 in | Wt 174.8 lb

## 2023-05-19 DIAGNOSIS — C8298 Follicular lymphoma, unspecified, lymph nodes of multiple sites: Secondary | ICD-10-CM

## 2023-05-19 DIAGNOSIS — R569 Unspecified convulsions: Secondary | ICD-10-CM | POA: Diagnosis not present

## 2023-05-19 DIAGNOSIS — E785 Hyperlipidemia, unspecified: Secondary | ICD-10-CM | POA: Diagnosis not present

## 2023-05-19 DIAGNOSIS — E663 Overweight: Secondary | ICD-10-CM

## 2023-05-19 NOTE — Patient Instructions (Signed)
It was great to see you!  *See if you can get bone density  Please go to the lab for blood work.   Our office will call you with your results unless you have chosen to receive results via MyChart.  If your blood work is normal we will follow-up each year for physicals and as scheduled for chronic medical problems.  If anything is abnormal we will treat accordingly and get you in for a follow-up.  Take care,  Lelon Mast

## 2023-05-23 LAB — SPECIMEN STATUS REPORT

## 2023-05-24 ENCOUNTER — Other Ambulatory Visit: Payer: Self-pay

## 2023-05-24 ENCOUNTER — Telehealth: Payer: Self-pay | Admitting: Physician Assistant

## 2023-05-24 DIAGNOSIS — C8298 Follicular lymphoma, unspecified, lymph nodes of multiple sites: Secondary | ICD-10-CM

## 2023-05-24 LAB — NMR, LIPOPROFILE

## 2023-05-24 LAB — LIPOPROTEIN A (LPA): Lipoprotein (a): 314 nmol/L — ABNORMAL HIGH (ref <75)

## 2023-05-24 NOTE — Telephone Encounter (Signed)
Lvm requesting pt call back and schedule a lab appt for a redraw

## 2023-05-24 NOTE — Telephone Encounter (Signed)
Please disregard , per provider redraw no longer needed

## 2023-05-25 ENCOUNTER — Inpatient Hospital Stay (HOSPITAL_BASED_OUTPATIENT_CLINIC_OR_DEPARTMENT_OTHER): Payer: Medicare Other | Admitting: Hematology

## 2023-05-25 ENCOUNTER — Inpatient Hospital Stay: Payer: Medicare Other | Attending: Hematology

## 2023-05-25 VITALS — BP 124/60 | HR 74 | Temp 97.9°F | Resp 20 | Wt 174.8 lb

## 2023-05-25 DIAGNOSIS — C8298 Follicular lymphoma, unspecified, lymph nodes of multiple sites: Secondary | ICD-10-CM | POA: Diagnosis not present

## 2023-05-25 DIAGNOSIS — G40909 Epilepsy, unspecified, not intractable, without status epilepticus: Secondary | ICD-10-CM | POA: Diagnosis not present

## 2023-05-25 DIAGNOSIS — C8285 Other types of follicular lymphoma, lymph nodes of inguinal region and lower limb: Secondary | ICD-10-CM | POA: Insufficient documentation

## 2023-05-25 DIAGNOSIS — D649 Anemia, unspecified: Secondary | ICD-10-CM | POA: Insufficient documentation

## 2023-05-25 DIAGNOSIS — Z79899 Other long term (current) drug therapy: Secondary | ICD-10-CM | POA: Diagnosis not present

## 2023-05-25 DIAGNOSIS — Z806 Family history of leukemia: Secondary | ICD-10-CM | POA: Diagnosis not present

## 2023-05-25 LAB — LACTATE DEHYDROGENASE: LDH: 147 U/L (ref 98–192)

## 2023-05-25 LAB — CBC WITH DIFFERENTIAL (CANCER CENTER ONLY)
Abs Immature Granulocytes: 0 10*3/uL (ref 0.00–0.07)
Basophils Absolute: 0.1 10*3/uL (ref 0.0–0.1)
Basophils Relative: 1 %
Eosinophils Absolute: 0.2 10*3/uL (ref 0.0–0.5)
Eosinophils Relative: 4 %
HCT: 36.2 % (ref 36.0–46.0)
Hemoglobin: 11.7 g/dL — ABNORMAL LOW (ref 12.0–15.0)
Immature Granulocytes: 0 %
Lymphocytes Relative: 37 %
Lymphs Abs: 1.7 10*3/uL (ref 0.7–4.0)
MCH: 28.1 pg (ref 26.0–34.0)
MCHC: 32.3 g/dL (ref 30.0–36.0)
MCV: 86.8 fL (ref 80.0–100.0)
Monocytes Absolute: 0.5 10*3/uL (ref 0.1–1.0)
Monocytes Relative: 11 %
Neutro Abs: 2.1 10*3/uL (ref 1.7–7.7)
Neutrophils Relative %: 47 %
Platelet Count: 248 10*3/uL (ref 150–400)
RBC: 4.17 MIL/uL (ref 3.87–5.11)
RDW: 14 % (ref 11.5–15.5)
WBC Count: 4.5 10*3/uL (ref 4.0–10.5)
nRBC: 0 % (ref 0.0–0.2)

## 2023-05-25 LAB — CMP (CANCER CENTER ONLY)
ALT: 17 U/L (ref 0–44)
AST: 21 U/L (ref 15–41)
Albumin: 4.5 g/dL (ref 3.5–5.0)
Alkaline Phosphatase: 79 U/L (ref 38–126)
Anion gap: 5 (ref 5–15)
BUN: 9 mg/dL (ref 8–23)
CO2: 27 mmol/L (ref 22–32)
Calcium: 9.4 mg/dL (ref 8.9–10.3)
Chloride: 109 mmol/L (ref 98–111)
Creatinine: 0.67 mg/dL (ref 0.44–1.00)
GFR, Estimated: >60 mL/min (ref >60)
Glucose, Bld: 90 mg/dL (ref 70–99)
Potassium: 4.1 mmol/L (ref 3.5–5.1)
Sodium: 141 mmol/L (ref 135–145)
Total Bilirubin: 0.7 mg/dL (ref <1.2)
Total Protein: 7.1 g/dL (ref 6.5–8.1)

## 2023-05-25 LAB — IRON AND IRON BINDING CAPACITY (CC-WL,HP ONLY)
Iron: 112 ug/dL (ref 28–170)
Saturation Ratios: 44 % — ABNORMAL HIGH (ref 10.4–31.8)
TIBC: 255 ug/dL (ref 250–450)
UIBC: 143 ug/dL — ABNORMAL LOW (ref 148–442)

## 2023-05-25 LAB — FERRITIN: Ferritin: 24 ng/mL (ref 11–307)

## 2023-05-25 NOTE — Progress Notes (Signed)
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   Patient was last seen by me on 12/27/2022 and complained of occasional fatigue.  Today, she reports that she has been doing well overall since her last clinical visit. Patient reports endorsing extreme fatigue on some days, causing her to stay in bed about 2-4 hours at a time. She denies any concern for needing to take a hard stop in regards to fatigue. Patient does report a general lack of structure in lifestyle since her retirement.   Patient reports that she sometimes endorses SOB when initially engaging in activity such as walking, but notes no breathing issues with exercise or in general otherwise.   Patient reports that she was seen by her gynecologist recently and there were concerns of new lymph node in left-sided neck.  She notes that it may be possible that it has been present for a while, but she had not noticed it. Patient reports that her other neck lymph nodes have been stable otherwise. She denies any other lumps or bumps that are new or different.   She denies any back pain, infection issues, or starting any new medications. She reports that her sleeping habits have been fairly normal.   She denies any fever, chills, night sweats, or sudden weight loss. Patient reports stable p.o. intake in general.   She reports that she sometimes endorses abdominal tightness with sense of fullness. Patient notes that the discomfort has generally remained mild, but does worsen sometimes. She denies any change in bowel habits.    MEDICAL HISTORY:  Past Medical History:  Diagnosis Date   Dyslipidemia    Lymphoma (HCC) 03/2021   Seizures (HCC) 2007   2007-last    SURGICAL HISTORY: Past Surgical History:  Procedure Laterality Date   CATARACT EXTRACTION W/ INTRAOCULAR LENS IMPLANT Bilateral 2019   CESAREAN SECTION  1981, 1990   COLONOSCOPY  2012   [facility]- normal   COLONOSCOPY WITH PROPOFOL  03/31/2023   [facility]   NO PAST SURGERIES     TUBAL LIGATION      SOCIAL HISTORY: Social History   Socioeconomic History   Marital status: Widowed    Spouse name: Not on file   Number of children: 2   Years of education: Not on file   Highest education level: Master's degree (e.g., MA, MS, MEng, MEd, MSW, MBA)  Occupational History   Not on file  Tobacco Use   Smoking status: Never   Smokeless tobacco: Never  Vaping Use   Vaping status: Never Used  Substance and Sexual Activity   Alcohol use: Not Currently    Alcohol/week: 0.0 standard drinks of alcohol   Drug use: No   Sexual activity: Not Currently    Birth control/protection: Post-menopausal  Other Topics Concern   Not on file  Social History Narrative   Retired from working in Education officer, environmental at a [employer]   Lives with daughter (who has mental health issues)   Has two children   Widowed   Social Determinants of Health   Financial Resource Strain: Low Risk  (05/19/2023)   Overall Physicist, medical  Strain (CARDIA)    Difficulty of Paying Living Expenses: Not hard at all  Food Insecurity: No Food Insecurity (05/19/2023)   Hunger Vital Sign    Worried About Running Out of Food in the Last Year: Never true    Ran Out of Food in the Last Year: Never true  Transportation Needs: No Transportation Needs (05/19/2023)   PRAPARE - Administrator, Civil Service (Medical): No    Lack of Transportation (Non-Medical): No  Physical Activity: Insufficiently Active (05/19/2023)   Exercise Vital Sign    Days of Exercise per Week: 2 days     Minutes of Exercise per Session: 20 min  Stress: No Stress Concern Present (05/19/2023)   [employer] of Occupational Health - Occupational Stress Questionnaire    Feeling of Stress : Only a little  Social Connections: Moderately Integrated (05/19/2023)   Social Connection and Isolation Panel [NHANES]    Frequency of Communication with Friends and Family: Three times a week    Frequency of Social Gatherings with Friends and Family: Once a week    Attends Religious Services: More than 4 times per year    Active Member of [employer] or Organizations: Yes    Attends Banker Meetings: More than 4 times per year    Marital Status: Widowed  Intimate Partner Violence: Not At Risk (05/16/2023)   Humiliation, Afraid, Rape, and Kick questionnaire    Fear of Current or Ex-Partner: No    Emotionally Abused: No    Physically Abused: No    Sexually Abused: No    FAMILY HISTORY: Family History  Problem Relation Age of Onset   Neuropathy Mother    Anuerysm Father    Leukemia Sister 34   Alzheimer's disease Sister 35   Dementia Brother    Heart attack Maternal Grandmother    CVA Maternal Grandfather    Dementia Paternal Grandmother    Seizures Daughter    Other Daughter        Psychological disorder   Colon cancer Neg Hx    Colon polyps Neg Hx    Esophageal cancer Neg Hx    Rectal cancer Neg Hx    Stomach cancer Neg Hx     ALLERGIES:  has No Known Allergies.  MEDICATIONS:  Current Outpatient Medications  Medication Sig Dispense Refill   atorvastatin (LIPITOR) 40 MG tablet TAKE 1 TABLET BY MOUTH EVERY DAY 90 tablet 0   B Complex-C (VITAMIN B + C COMPLEX) TABS      cetirizine (ZYRTEC) 10 MG tablet Take 10 mg by mouth daily.     LamoTRIgine 300 MG TB24 24 hour tablet TAKE 1 TABLET BY MOUTH EVERY DAY 90 tablet 3   Omega-3 Fatty Acids (FISH OIL) 1000 MG CAPS Take 1 capsule by mouth daily in the afternoon.     Vitamin D, Cholecalciferol, 25 MCG (1000 UT) CAPS Take 1 capsule  by mouth daily in the afternoon.     No current facility-administered medications for this visit.    10 Point review of Systems was done is negative except as noted above.   PHYSICAL EXAMINATION: ECOG PERFORMANCE STATUS: 1 - Symptomatic but completely ambulatory  Vitals:   05/25/23 1305  BP: 124/60  Pulse: 74  Resp: 20  Temp: 97.9 F (36.6 C)  SpO2: 98%    Filed Weights   05/25/23 1305  Weight: 174 lb 12.8 oz (79.3 kg)   .Body mass index is 29.09 kg/m.  GENERAL:alert, in no acute distress  and comfortable SKIN: no acute rashes, no significant lesions EYES: conjunctiva are pink and non-injected, sclera anicteric OROPHARYNX: MMM, no exudates, no oropharyngeal erythema or ulceration NECK: supple, no JVD LYMPH:  no palpable lymphadenopathy in the cervical, axillary or inguinal regions LUNGS: clear to auscultation b/l with normal respiratory effort HEART: regular rate & rhythm ABDOMEN:  normoactive bowel sounds , non tender, not distended. Extremity: no pedal edema PSYCH: alert & oriented x 3 with fluent speech NEURO: no focal motor/sensory deficits   LABORATORY DATA:  I have reviewed the data as listed  .    Latest Ref Rng & Units 05/25/2023   12:37 PM 12/27/2022   12:33 PM 06/28/2022    1:41 PM  CBC  WBC 4.0 - 10.5 K/uL 4.5  5.2  5.8   Hemoglobin 12.0 - 15.0 g/dL 16.1  09.6  04.5   Hematocrit 36.0 - 46.0 % 36.2  34.6  35.4   Platelets 150 - 400 K/uL 248  237  216    .CBC    Component Value Date/Time   WBC 4.5 05/25/2023 1237   WBC 5.3 12/03/2021 0927   RBC 4.17 05/25/2023 1237   HGB 11.7 (L) 05/25/2023 1237   HGB 11.3 12/14/2018 1017   HCT 36.2 05/25/2023 1237   HCT 31.5 (L) 04/02/2021 1304   PLT 248 05/25/2023 1237   PLT 289 12/14/2018 1017   MCV 86.8 05/25/2023 1237   MCV 87 12/14/2018 1017   MCH 28.1 05/25/2023 1237   MCHC 32.3 05/25/2023 1237   RDW 14.0 05/25/2023 1237   RDW 13.6 12/14/2018 1017   LYMPHSABS 1.7 05/25/2023 1237   LYMPHSABS 1.3  12/14/2018 1017   MONOABS 0.5 05/25/2023 1237   EOSABS 0.2 05/25/2023 1237   EOSABS 0.3 12/14/2018 1017   BASOSABS 0.1 05/25/2023 1237   BASOSABS 0.0 12/14/2018 1017       Latest Ref Rng & Units 05/25/2023   12:37 PM 12/27/2022   12:33 PM 06/28/2022    1:41 PM  CMP  Glucose 70 - 99 mg/dL 90  87  77   BUN 8 - 23 mg/dL 9  11  11    Creatinine 0.44 - 1.00 mg/dL 4.09  8.11  9.14   Sodium 135 - 145 mmol/L 141  141  141   Potassium 3.5 - 5.1 mmol/L 4.1  4.1  4.2   Chloride 98 - 111 mmol/L 109  109  109   CO2 22 - 32 mmol/L 27  25  27    Calcium 8.9 - 10.3 mg/dL 9.4  9.4  9.3   Total Protein 6.5 - 8.1 g/dL 7.1  7.0  6.9   Total Bilirubin <1.2 mg/dL 0.7  0.8  0.9   Alkaline Phos 38 - 126 U/L 79  76  71   AST 15 - 41 U/L 21  24  22    ALT 0 - 44 U/L 17  19  13     . Lab Results  Component Value Date   LDH 147 05/25/2023   Component     Latest Ref Rng & Units 04/02/2021  Iron     28 - 170 ug/dL 48  TIBC     782 - 956 ug/dL 213 (L)  Saturation Ratios     10.4 - 31.8 % 20  UIBC     ug/dL 086  Folate, Hemolysate     Not Estab. ng/mL 287.0  HCT     34.0 - 46.6 % 31.5 (L)  Folate, RBC     >  498 ng/mL 911  DAT, complement      NEG  DAT, IgG      NEG . . .  Hep B Core Total Ab     NON REACTIVE NON REACTIVE  Hepatitis B Surface Ag     NON REACTIVE NON REACTIVE  HCV Ab     NON REACTIVE NON REACTIVE  Haptoglobin     37 - 355 mg/dL 161  Ferritin     11 - 307 ng/mL 131  Vitamin B12     180 - 914 pg/mL 789  LDH     98 - 192 U/L 181    SURGICAL PATHOLOGY  CASE: [id number]  PATIENT: Ann-Marie Grisso  Surgical Pathology Report      Clinical History: diffuse LAN, no primary, R/O lymphoma (cm)    FINAL MICROSCOPIC DIAGNOSIS:   A. LYMPH NODE, RIGHT INGUINAL, NEEDLE CORE BIOPSY:  -  Non-Hodgkin B-cell lymphoma  -  See comment   COMMENT:   The biopsy consists of three small lymph node cores composed of a  monotonous proliferation of small to medium sized  lymphocytes without  well-formed, back to back follicles.  By immunohistochemistry there is a  slight predominance of B cells by CD20 and CD3 immunohistochemistry.  The B cells appear to co-express CD79a, CD10, BCL6 and Bcl-2 both within  follicles and within the interfollicular spaces. CD23 shows expanded  dendritic meshworks. The B-cells are negative for CD5 and cyclin D1.  Flow cytometry identified a kappa-restricted monoclonal B-cell  population expressing CD10 (42% of all lymphocytes; See [id number]).  Overall, the findings are consistent with a non-Hodgkin B-cell lymphoma,  most likely follicular lymphoma.  FISH for t(14;18) is pending and will  be reported in an addendum along with ki-67.  Unfortunately, given the  limited and fragmented nature of the sample accurate grading of the  lymphoma is not possible on the sample provided.   SURGICAL PATHOLOGY Surgical Pathology   THIS IS AN ADDENDUM REPORT   CASE: [id number]  PATIENT: Sidnee Nedd  Bone Marrow Report  Addendum    Reason for Addendum #1:  Molecular Add-on   Clinical History: Follicular lymphoma of lymphoma nodes of multiple  regions, unspecified grade (HCC)  ,( BH)    DIAGNOSIS:   BONE MARROW, ASPIRATE, CLOT, CORE:  -  Hypercellular marrow involved by follicular lymphoma  -  See microscopic description and comment below   PERIPHERAL BLOOD:  -  Normocytic anemia  -  See complete blood cell count   COMMENT:   The combined morphologic and flow cytometric features are consistent  with bone marrow involvement by the patient's previously diagnosed  follicular lymphoma.  Correlation with cytogenetics is recommended.    ADDENDUM:   ** Please note this testing was performed and interpreted by an outside  facility ([facility]).  This addendum is only being added to provide a  summary of the results for report completeness.  Please see electronic  medical record for a copy of the full report. **    Karyotype:  46,X,t(X;18)(q13;q11.2),+der(X)t(X;18),add(6)(q12),-11,t(12;13)(q24.1;q1  2),t(14;18)(q32;q21)[2]/46,XX[18]   Interpretation:  ABNORMAL FEMALE KARYOTYPE   The abnormalities include loss of material from the long arm of  chromosome 6 and the t(14;18)(q32;q21) - both recurring abnormalities in  lymphomas. The t(14;18)(q32;q21.3), involving the IGH gene at 14q32.3  and the BCL2 gene at 18q21.3, is observed in 90% of follicular  lymphomas.    RADIOGRAPHIC STUDIES: I have personally reviewed the radiological images as listed and agreed with the findings in  the report. No results found.  ASSESSMENT & PLAN:   72 year old female with history of seizure disorder with  1) Recently diagnosed stage IVa follicular lymphoma PET/CT reviewed as noted above  2) history of seizure disorder on Lamictal  3) normocytic anemia could be from lymphoma plus her recent pneumonia hemoglobin is improved to 11.7 from 11.2.  PLAN:  -Discussed lab results on 05/25/23 in detail with patient. CBC stable, showed WBC of 4.5K, hemoglobin improved to 11.7, and platelets of 248K. -CMP stable -iron labs pending at time of visit; if patient is found to be iron deficient, causing her fatigue, it shall be addressed -patient's Lamotrigine levels could play a role in her fatigue -Patient has no significant symptomatic follicular lymphoma progression at this time -No acute new indications to treat her low-grade follicular lymphoma at this time and patient prefers to take a more conservative approach. -there are a few small lymph nodes in her neck which have generally been stable and are generally not expected to be bothersome. Did not feel any signs of bulky disease.  -did not feel an enlarged spleen during physical examination -educated patient that if she endorses debilitating severe fatigue, there may be a role to treat lymphoma. No indication to initiate lymphoma treatment at this time.  -recommend  incorporating plenty of fresh fruits and vegetables in her diet, engaging in daily exercise, and optimizing sleeping habits  -reasonable to repeat scan in 4-6 months to evaluate for any significant change in lymph nodes since her last scan -answered all of patient's questions in detail  FOLLOW-UP: CT chest/abd/pelvis in 22 weeks RTC with Dr Candise Che with labs in 6 months  The total time spent in the appointment was 30 minutes* .  All of the patient's questions were answered with apparent satisfaction. The patient knows to call the clinic with any problems, questions or concerns.   Wyvonnia Lora MD MS AAHIVMS [facility] Hematology/Oncology Physician [facility]  .*Total Encounter Time as defined by the Centers for Medicare and Medicaid Services includes, in addition to the face-to-face time of a patient visit (documented in the note above) non-face-to-face time: obtaining and reviewing outside history, ordering and reviewing medications, tests or procedures, care coordination (communications with other health care professionals or caregivers) and documentation in the medical record.    I,Mitra Faeizi,acting as a Neurosurgeon for Wyvonnia Lora, MD.,have documented all relevant documentation on the behalf of Wyvonnia Lora, MD,as directed by  Wyvonnia Lora, MD while in the presence of Wyvonnia Lora, MD.  .I have reviewed the above documentation for accuracy and completeness, and I agree with the above. Johney Maine MD

## 2023-05-26 ENCOUNTER — Other Ambulatory Visit: Payer: Self-pay | Admitting: *Deleted

## 2023-05-26 DIAGNOSIS — E785 Hyperlipidemia, unspecified: Secondary | ICD-10-CM

## 2023-05-31 ENCOUNTER — Encounter: Payer: Self-pay | Admitting: Internal Medicine

## 2023-06-21 ENCOUNTER — Ambulatory Visit: Payer: Medicare Other | Admitting: Cardiology

## 2023-06-27 ENCOUNTER — Ambulatory Visit: Payer: Medicare Other | Admitting: Hematology

## 2023-06-27 ENCOUNTER — Other Ambulatory Visit: Payer: Medicare Other

## 2023-06-28 ENCOUNTER — Encounter: Payer: Self-pay | Admitting: Internal Medicine

## 2023-07-12 LAB — NMR, LIPOPROFILE
Cholesterol, Total: 202 mg/dL — ABNORMAL HIGH (ref 100–199)
HDL Particle Number: 31.4 umol/L (ref >=30.5)
HDL-C: 69 mg/dL (ref >39)
LDL Particle Number: 1439 nmol/L — ABNORMAL HIGH (ref <1000)
LDL Size: 21.2 nmol (ref >20.5)
LDL-C (NIH Calc): 121 mg/dL — ABNORMAL HIGH (ref 0–99)
LP-IR Score: <25 (ref <=45)
Small LDL Particle Number: 410 nmol/L (ref <=527)
Triglycerides: 69 mg/dL (ref 0–149)

## 2023-07-12 NOTE — Progress Notes (Deleted)
   Cardiology Office Note    Date:  07/12/2023  ID:  Katie Mcneil, DOB 04/01/1951, MRN [id number] PCP:  Jarold Motto, PA  Cardiologist:  None  Electrophysiologist:  None   Chief Complaint: ***  History of Present Illness: .    Katie Mcneil is a 73 y.o. female with visit-pertinent history of dyslipidemia.  First evaluated by Dr. Rennis Golden on 12/17/2022 at the request of her PCP for management of dyslipidemia.  Patient had history of high cholesterol but had not been on therapy.  CT pulmonary angiogram in the ED in 2023 showed some coronary artery calcification and aortic atherosclerosis suggestive of ASCVD.  She was started on atorvastatin.  She was last seen in clinic by Dr. Rennis Golden on 12/17/2022, she had been tolerating atorvastatin well.  Her LDL had decreased down to 93, HDL 66 and triglyceride 69.  It was recommended that she start on Leqvio with repeat lipid NMR and lipoprotein a in 4 to 6 months.  Hyperlipidemia/?familial hyperlipidemia:  Coronary artery calcification:      Labwork independently reviewed:   ROS: .    Please see the history of present illness. Otherwise, review of systems is positive for ***.  All other systems are reviewed and otherwise negative.  Studies Reviewed: [name]    EKG:  EKG is ordered today, personally reviewed, demonstrating ***  CV Studies: Cardiac studies reviewed are outlined and summarized above. Otherwise please see EMR for full report.   Current Reported Medications:.    No outpatient medications have been marked as taking for the 07/14/23 encounter (Appointment) with Rip Harbour, NP.    Physical Exam:    VS:  There were no vitals taken for this visit.   Wt Readings from Last 3 Encounters:  05/25/23 174 lb 12.8 oz (79.3 kg)  05/19/23 174 lb 12.8 oz (79.3 kg)  05/16/23 175 lb (79.4 kg)    GEN: Well nourished, well developed in no acute distress NECK: No JVD; No carotid bruits CARDIAC: ***RRR, no murmurs, rubs,  gallops RESPIRATORY:  Clear to auscultation without rales, wheezing or rhonchi  ABDOMEN: Soft, non-tender, non-distended EXTREMITIES:  No edema; No acute deformity   Asessement and Plan:.     ***     Disposition: F/u with ***  Signed, Rip Harbour, NP

## 2023-07-14 ENCOUNTER — Telehealth: Payer: Self-pay | Admitting: Internal Medicine

## 2023-07-14 ENCOUNTER — Ambulatory Visit: Payer: Medicare Other | Admitting: Cardiology

## 2023-07-14 NOTE — Telephone Encounter (Signed)
Called patient regarding 11:20 appointment scheduled with [facility] by mistake patient needs to be seen by Dr Rennis Golden unless patient has acute concerns such as chest pain or shortness of breath patient expressed she didn't have any concerns and is ok with canceling today's appointment informed patient someone from scheduling will reach out an reschedule patient verbalized understanding

## 2023-08-10 ENCOUNTER — Ambulatory Visit (HOSPITAL_COMMUNITY)
Admission: RE | Admit: 2023-08-10 | Discharge: 2023-08-10 | Disposition: A | Discharge status: Home / Self Care | Payer: Medicare Other | Source: Ambulatory Visit | Attending: Hematology | Admitting: Hematology

## 2023-08-10 DIAGNOSIS — C8298 Follicular lymphoma, unspecified, lymph nodes of multiple sites: Secondary | ICD-10-CM | POA: Insufficient documentation

## 2023-08-10 MED ORDER — IOHEXOL 300 MG/ML  SOLN
30.0000 mL | Freq: Once | INTRAMUSCULAR | Status: AC | PRN
  Administered 2023-08-10: 30 mL via ORAL

## 2023-08-10 MED ORDER — IOHEXOL 300 MG/ML  SOLN
100.0000 mL | Freq: Once | INTRAMUSCULAR | Status: AC | PRN
  Administered 2023-08-10: 100 mL via INTRAVENOUS

## 2023-08-15 ENCOUNTER — Telehealth: Payer: Self-pay

## 2023-08-15 ENCOUNTER — Other Ambulatory Visit: Payer: Self-pay | Admitting: Internal Medicine

## 2023-08-15 DIAGNOSIS — E7849 Other hyperlipidemia: Secondary | ICD-10-CM

## 2023-08-15 DIAGNOSIS — E7841 Elevated Lipoprotein(a): Secondary | ICD-10-CM

## 2023-08-15 NOTE — Telephone Encounter (Signed)
 New infusion center referral entered.

## 2023-08-15 NOTE — Telephone Encounter (Signed)
 Eileen Stanford,  Will do.   Thanks Selena Batten

## 2023-08-15 NOTE — Telephone Encounter (Signed)
 Katie Mcneil, Patient has active Medicare A/B & AARP supp. Leqvio will be covered 100%. Medicare will cover 80% and AARP will pick-up the remaining 20%. Please enter the treatment plan and we will schedule patient as soon as possible

## 2023-08-15 NOTE — Telephone Encounter (Signed)
 Dr. Rennis Golden and Eileen Stanford, patient will be scheduled as soon as possible.  Auth Submission: NO AUTH NEEDED Site of care: Site of care: [facility] Payer: Medicare A/B with AARP supplement Medication & CPT/J Code(s) submitted: Leqvio Route of submission (phone, fax, portal):  Phone # Fax # Auth type: Buy/Bill PB Units/visits requested: 284mg  x 3 doses Reference number:  Approval from: 08/15/23 to 07/14/24  Medicare A/B will cover 80%, AARP will cover the remaining 20%.

## 2023-08-17 ENCOUNTER — Encounter (HOSPITAL_BASED_OUTPATIENT_CLINIC_OR_DEPARTMENT_OTHER): Payer: Medicare Other | Admitting: Nurse Practitioner

## 2023-08-17 ENCOUNTER — Encounter (HOSPITAL_BASED_OUTPATIENT_CLINIC_OR_DEPARTMENT_OTHER): Payer: Self-pay

## 2023-08-22 NOTE — Addendum Note (Signed)
 Addended by: Lindell Spar on: 08/22/2023 02:11 PM   Modules accepted: Orders

## 2023-08-22 NOTE — Telephone Encounter (Signed)
 NMR lipoprofile and LPa ordered to be done in about 6 months - recall entered Lab slip(s) mailed  1st scheduled leqvio injection is: 08/24/2023

## 2023-08-24 ENCOUNTER — Ambulatory Visit (INDEPENDENT_AMBULATORY_CARE_PROVIDER_SITE_OTHER)

## 2023-08-24 VITALS — BP 122/70 | HR 62 | Temp 97.9°F | Resp 16 | Ht 66.0 in | Wt 178.4 lb

## 2023-08-24 DIAGNOSIS — E7849 Other hyperlipidemia: Secondary | ICD-10-CM | POA: Diagnosis not present

## 2023-08-24 MED ORDER — INCLISIRAN SODIUM 284 MG/1.5ML ~~LOC~~ SOSY
284.0000 mg | PREFILLED_SYRINGE | Freq: Once | SUBCUTANEOUS | Status: AC
  Administered 2023-08-24: 284 mg via SUBCUTANEOUS
  Filled 2023-08-24: qty 1.5

## 2023-08-24 NOTE — Progress Notes (Signed)
 Diagnosis: Hyperlipidemia  Provider:  Chilton Greathouse MD  Procedure: Injection  Leqvio (inclisiran), Dose: 284 mg, Site: subcutaneous, Number of injections: 1  Injection Site(s): Left arm  Post Care: Observation period completed  Discharge: Condition: Good, Destination: Home . AVS Declined  Performed by:  Loney Hering, LPN

## 2023-09-07 ENCOUNTER — Encounter: Payer: Self-pay | Admitting: Podiatry

## 2023-09-07 ENCOUNTER — Ambulatory Visit (INDEPENDENT_AMBULATORY_CARE_PROVIDER_SITE_OTHER): Admitting: Podiatry

## 2023-09-07 DIAGNOSIS — B351 Tinea unguium: Secondary | ICD-10-CM | POA: Diagnosis not present

## 2023-09-07 DIAGNOSIS — M79674 Pain in right toe(s): Secondary | ICD-10-CM

## 2023-09-07 DIAGNOSIS — M79675 Pain in left toe(s): Secondary | ICD-10-CM

## 2023-09-07 DIAGNOSIS — M21619 Bunion of unspecified foot: Secondary | ICD-10-CM

## 2023-09-07 DIAGNOSIS — L600 Ingrowing nail: Secondary | ICD-10-CM | POA: Diagnosis not present

## 2023-09-07 DIAGNOSIS — M2041 Other hammer toe(s) (acquired), right foot: Secondary | ICD-10-CM

## 2023-09-07 NOTE — Patient Instructions (Signed)

## 2023-09-08 NOTE — Progress Notes (Signed)
 Subjective:   Patient ID: Katie Mcneil, female   DOB: 73 y.o.   MRN: [id number]   HPI Patient presents with painful ingrown toenail of the right big toe that she cannot trim with pressure between the hallux and second toe history of bunion and hammertoe correction left that did well with significant structural bunion and hammertoe deformity right with dorsal keratotic lesion digit 2.  This is all been going on for a while with the ingrown the last several months with no active drainage noted.  Patient does not smoke likes to be active   Review of Systems  All other systems reviewed and are negative.       Objective:  Physical Exam Vitals and nursing note reviewed.  Constitutional:      Appearance: She is well-developed.  Pulmonary:     Effort: Pulmonary effort is normal.  Musculoskeletal:        General: Normal range of motion.  Skin:    General: Skin is warm.  Neurological:     Mental Status: She is alert.     Neurovascular status intact muscle strength found to be adequate range of motion adequate with patient found to have incurvated lateral border right big toe painful when pressed with distal irritation of the tissue no active redness no active drainage with significant structural deformity of the right over left foot with elevated second toe rigid contracture keratotic lesion and bunion deformity.  Good digital perfusion well-oriented x 3     Assessment:  Ingrown toenail deformity right hallux lateral border structural bunion deformity hammertoe deformity with pain with history of correction left this done very well for her clinically     Plan:  H&P reviewed all conditions and today organ to focus on the ingrown toenail and 1 point in the future we will probably focus on the structural deformity.  I went ahead I allowed her to read then signed consent form I infiltrated the right big toe 60 mg like Marcaine mixture sterile prep done and using sterile instrumentation  removed lateral border exposed matrix applied phenol 3 applications 30 seconds followed by alcohol lavage sterile dressing gave instructions on soaks wear dressing 24 hours taken off earlier if throbbing were to occur and encouraged her to call with questions concerns which may arise.  Discussed correction of bunion hammertoe and that will be discussed later this year with courtesy debridement done second toe right foot

## 2023-09-09 ENCOUNTER — Ambulatory Visit
Admission: RE | Admit: 2023-09-09 | Discharge: 2023-09-09 | Disposition: A | Discharge status: Home / Self Care | Source: Ambulatory Visit | Attending: Internal Medicine | Admitting: Internal Medicine

## 2023-09-09 ENCOUNTER — Ambulatory Visit: Payer: Self-pay

## 2023-09-09 ENCOUNTER — Telehealth: Payer: Self-pay | Admitting: Podiatry

## 2023-09-09 VITALS — BP 113/70 | HR 70 | Temp 98.5°F | Resp 17

## 2023-09-09 DIAGNOSIS — L600 Ingrowing nail: Secondary | ICD-10-CM

## 2023-09-09 DIAGNOSIS — L03031 Cellulitis of right toe: Secondary | ICD-10-CM

## 2023-09-09 MED ORDER — MUPIROCIN 2 % EX OINT: 1.0000 | TOPICAL_OINTMENT | Freq: Two times a day (BID) | CUTANEOUS | 0 refills | Status: DC

## 2023-09-09 MED ORDER — CEPHALEXIN 500 MG PO CAPS: 500.0000 mg | ORAL_CAPSULE | Freq: Three times a day (TID) | ORAL | 0 refills | Status: AC

## 2023-09-09 NOTE — Telephone Encounter (Signed)
 Copied from CRM [id number]. Topic: Clinical - Red Word Triage >> Sep 09, 2023 12:44 PM Cammy Copa D wrote: Red Word that prompted transfer to Nurse Triage: Procedure 3/26 to remove in grown toenail, significantly swollen, a little painful   Chief Complaint: Right Foot swelling Symptoms: swelling,  Frequency: since last night Pertinent Negatives: Patient denies chest pain, difficulty breathing, palpitations Disposition: [] ED /[x] Urgent Care (no appt availability in office) / [] Appointment(In office/virtual)/ []  [facility]/ [] Home Care/ [] Refused Recommended Disposition /[] [place] [facility]/ []  Follow-up with PCP Additional Notes: Patient called and advised that her big toe on her right foot had an ingrown toenail taken care of two days ago at the [facility]. Patient was told to soak it in epsom salt when she was home. 3 out of 10 pain level. Patient states that this right foot feels numb. Patient states that this swelling goes up to her ankle. Patient states that she did call her foot and ankle office where she had her procedure done at.  She states that they were going to give her an appointment weeks out and she was supposed to get a call back from them and she hadn't heard from them yet and she states that they close at 2:30pm today.  This RN reviewed the notes from the [facility] and it is noted at the end of these notes for the patient to monitor for any signs/symptoms of infection---and to call them immediately or go directly to the emergency room. Patient describes this foot as being swollen and slightly painful and numb. Patient is advised that she needed to have this right foot assessed as soon as possible and not wait over the entire weekend.  Patient states that she is going to go to an Urgent Care. No appointments available in patient's PCP office until Monday anyway so patient is okay with going to Urgent Care to get  this foot evaluated.   Reason for Disposition  SEVERE swelling (e.g., can't move swollen ankle at all)  Answer Assessment - Initial Assessment Questions 1. SYMPTOM: "What is the main symptom you are concerned about?" (e.g., weakness, numbness)     Right foot swelling/numbness 2. ONSET: "When did this start?" (minutes, hours, days; while sleeping)     Last night 3. LAST NORMAL: "When was the last time you (the patient) were normal (no symptoms)?"     Before last night 4. PATTERN "Does this come and go, or has it been constant since it started?"  "Is it present now?"     constant 5. CARDIAC SYMPTOMS: "Have you had any of the following symptoms: chest pain, difficulty breathing, palpitations?"     no 6. NEUROLOGIC SYMPTOMS: "Have you had any of the following symptoms: headache, dizziness, vision loss, double vision, changes in speech, unsteady on your feet?"     no 7. OTHER SYMPTOMS: "Do you have any other symptoms?"     N/a  Protocols used: Neurologic Deficit-A-AH, Ankle Swelling-A-AH

## 2023-09-09 NOTE — ED Triage Notes (Addendum)
 Pt present for right foot swelling. States she had ingrown toenail removed from right big toe on 3/26 at [facility]. Pt c/o right foot being slightly pain, swollen, and numb for 1 day.

## 2023-09-09 NOTE — Telephone Encounter (Signed)
 Patient called at 2:01am on 09/09/23 and left a voice mail stating swelling on right foot, same foot great toe ingrown toe nail was clipped. I called patient this morning and spoke with patient. Patient is requesting to speak with provider or nurse. Contact telephone [phone]

## 2023-09-09 NOTE — Telephone Encounter (Signed)
 Noted, pt going to Urgent Care.

## 2023-09-09 NOTE — ED Provider Notes (Signed)
 [facility]    CSN: [id number] Arrival date & time: 09/09/23  1553      History   Chief Complaint Chief Complaint  Patient presents with   Foot Pain    Right Foot swollen up to ankle after ingrown toenail removal on 3/26 - Entered by patient    HPI Katie Mcneil is a 73 y.o. female.   Patient presents to urgent care for evaluation of pain and swelling to the distal right great toenail after ingrown toenail removal to the bilateral nail folds of the right great toe.  She had ingrown toenails removed 2 days ago on September 07, 2023 at podiatry office and was not placed on any empiric antibiotics.  She was given infection return precautions.  She states she has noticed more redness and swelling to the tip of the affected toe and is concerned for infection.  History of lymphoma, no other history of immunosuppression/diabetes.  No recent antibiotic or steroid use in the last 90 days.  She has not noticed any purulent drainage from the wound bed and has not had a fever.  Performing Epsom salt soaks as recommended by podiatrist.   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   Lymphadenopathy 03/24/2021   Seizure (HCC) 03/24/2021   Leucocytosis 03/24/2021   Anemia 03/24/2021   Tachypnea 03/24/2021   Low grade fever 03/24/2021   Night sweats 03/24/2021   Abnormal cervical Papanicolaou smear 03/23/2021   Epilepsy (HCC) 03/01/2021   Iron deficiency anemia 10/10/2020   Osteopenia 10/01/2020   Encounter for long-term (current) use of other medications 11/01/2013   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   NO PAST SURGERIES     TUBAL LIGATION      OB History   No obstetric history on file.      Home Medications    Prior to Admission medications   Medication Sig Start Date End Date Taking? Authorizing Provider  cephALEXin (KEFLEX) 500 MG capsule Take 1 capsule (500 mg total) by mouth 3 (three) times daily for 7 days. 09/09/23 09/16/23 Yes Marirose Deveney, Donavan Burnet, FNP  mupirocin ointment (BACTROBAN) 2 % Apply 1 Application topically 2 (two) times daily. 09/09/23  Yes Carlisle Beers, FNP  atorvastatin (LIPITOR) 40 MG tablet TAKE 1 TABLET BY MOUTH EVERY DAY 05/11/23   Hilty, Lisette Abu, MD  B Complex-C (VITAMIN B + C COMPLEX) TABS  05/14/21   [provider]  cetirizine (ZYRTEC) 10 MG tablet Take 10 mg by mouth daily.    [provider]  LamoTRIgine 300 MG TB24 24 hour tablet TAKE 1 TABLET BY MOUTH EVERY DAY 03/07/23   Butch Penny, NP  Omega-3 Fatty Acids (FISH OIL) 1000 MG CAPS Take 1 capsule by mouth daily in the afternoon.    [provider]  Vitamin D, Cholecalciferol, 25 MCG (1000 UT) CAPS Take 1 capsule by mouth daily in the afternoon.    [provider]    Family History Family History  Problem Relation Age of Onset   Neuropathy Mother    Anuerysm Father    Leukemia Sister 93   Alzheimer's disease Sister 60   Dementia Brother    Heart attack Maternal Grandmother    CVA Maternal Grandfather    Dementia Paternal Grandmother    Seizures Daughter    Other Daughter        Psychological disorder   Colon cancer Neg Hx    Colon polyps Neg Hx    Esophageal cancer Neg Hx    Rectal cancer Neg Hx    Stomach cancer Neg Hx     Social History Social History   Tobacco Use   Smoking status: Never   Smokeless tobacco: Never  Vaping Use   Vaping status: Never Used  Substance Use Topics    Alcohol use: Not Currently    Alcohol/week: 0.0 standard drinks of alcohol   Drug use: No     Allergies   Patient has no known allergies.   Review of Systems Review of Systems Per HPI  Physical Exam Triage Vital Signs ED Triage Vitals  Encounter Vitals Group     BP 09/09/23 1604 113/70     Systolic BP Percentile --      Diastolic BP Percentile --      Pulse Rate 09/09/23 1604 70     Resp 09/09/23 1604 17     Temp 09/09/23 1604 98.5 F (36.9 C)     Temp Source 09/09/23 1604 Oral     SpO2 09/09/23 1604 95 %     Weight --      Height --      Head Circumference --      Peak Flow --      Pain Score 09/09/23 1611 4     Pain Loc --      Pain Education --      Exclude from Growth Chart --    No data found.  Updated Vital Signs BP 113/70 (BP Location: Right Arm)   Pulse 70   Temp 98.5 F (36.9 C) (Oral)   Resp 17   SpO2 95%   Visual Acuity Right Eye Distance:   Left Eye Distance:   Bilateral Distance:    Right Eye Near:   Left Eye Near:    Bilateral Near:     Physical Exam Vitals and nursing note reviewed.  Constitutional:      Appearance: She is not ill-appearing or toxic-appearing.  HENT:     Head: Normocephalic and atraumatic.     Right Ear: Hearing and external ear normal.     Left Ear: Hearing and external ear normal.     Nose: Nose normal.     Mouth/Throat:     Lips: Pink.  Eyes:     General: Lids are normal. Vision grossly intact. Gaze aligned appropriately.     Extraocular Movements: Extraocular movements intact.     Conjunctiva/sclera: Conjunctivae normal.  Cardiovascular:     Pulses:          Dorsalis pedis pulses are 2+ on the right side.  Pulmonary:     Effort: Pulmonary effort is normal.  Musculoskeletal:     Cervical back: Neck supple.  Feet:     Comments: Ingrown toenail resection to the bilateral nail folds of the right great toenail last seen in image below.  Nailbed is minimally swollen and red.  Scant purulent drainage tender  medial nail  fold present.  Very tender to palpation.  Sensation and strength intact distally.  Cap refill less than 2. Skin:    General: Skin is warm and dry.     Capillary Refill: Capillary refill takes less than 2 seconds.     Findings: No rash.  Neurological:     General: No focal deficit present.     Mental Status: She is alert and oriented to person, place, and time. Mental status is at baseline.     Cranial Nerves: No dysarthria or facial asymmetry.  Psychiatric:        Mood and Affect: Mood normal.        Speech: Speech normal.        Behavior: Behavior normal.        Thought Content: Thought content normal.        Judgment: Judgment normal.    Right great toe   Right great toe    UC Treatments / Results  Labs (all labs ordered are listed, but only abnormal results are displayed) Labs Reviewed - No data to display  EKG   Radiology No results found.  Procedures Procedures (including critical care time)  Medications Ordered in UC Medications - No data to display  Initial Impression / Assessment and Plan / UC Course  I have reviewed the triage vital signs and the nursing notes.  Pertinent labs & imaging results that were available during my care of the patient were reviewed by me and considered in my medical decision making (see chart for details).   1.  Cellulitis of right toe, ingrown toenail of right foot Presentation consistent with cellulitis of her right great toe after removal of the right ingrown toenail. Will treat with cephalexin and mupirocin. Epsom salt soaks as directed by podiatrist recommended. Tylenol as needed for pain. Postoperative shoe applied to prevent reinjury and improve pain/provide support. Recommend follow-up with podiatry as needed.  Counseled patient on potential for adverse effects with medications prescribed/recommended today, strict ER and return-to-clinic precautions discussed, patient verbalized understanding.    Final  Clinical Impressions(s) / UC Diagnoses   Final diagnoses:  Cellulitis of toe, right  Ingrown toenail of right foot     Discharge Instructions      Take antibiotic as prescribed. Use mupirocin ointment every 12 hours for 7 days. Epsom salt soaks as directed by podiatrist.  Wear post-operative shoe until healed. Follow-up with podiatry as needed.    ED Prescriptions     Medication Sig Dispense Auth. Provider   cephALEXin (KEFLEX) 500 MG capsule Take 1 capsule (500 mg total) by mouth 3 (three) times daily for 7 days. 21 capsule Carlisle Beers, FNP   mupirocin ointment (BACTROBAN) 2 % Apply 1 Application topically 2 (two) times daily. 22 g Carlisle Beers, FNP      PDMP not reviewed this encounter.   Carlisle Beers, [place] 09/09/23 1734

## 2023-09-09 NOTE — Discharge Instructions (Addendum)
 Take antibiotic as prescribed. Use mupirocin ointment every 12 hours for 7 days. Epsom salt soaks as directed by podiatrist.  Wear post-operative shoe until healed. Follow-up with podiatry as needed.

## 2023-09-12 ENCOUNTER — Encounter: Payer: Self-pay | Admitting: Podiatry

## 2023-09-12 ENCOUNTER — Encounter: Payer: Self-pay | Admitting: Internal Medicine

## 2023-09-12 ENCOUNTER — Ambulatory Visit (INDEPENDENT_AMBULATORY_CARE_PROVIDER_SITE_OTHER): Admitting: Podiatry

## 2023-09-12 DIAGNOSIS — L600 Ingrowing nail: Secondary | ICD-10-CM

## 2023-09-12 NOTE — Progress Notes (Signed)
   Chief Complaint  Patient presents with   Toe Pain    Hallux right - ingrown procedure on 09/07/23 by Dr. Charlsie Merles, whole foot and toe was swollen on Friday, went to Urgent Care-rx'd antibiotics-foot and toe is better, but still wanted it checked, toe is still tender    Subjective: 73 y.o. female presents today status post permanent nail avulsion procedure of the lateral border of the right great toe that was performed on 09/07/2023.  2 days following the procedure she noticed inflammation with redness and swelling to the toe and she went to the urgent care.  Antibiotic pills and topical antibiotic was prescribed.  Since that time is improved significantly however they recommended that she follows up in the office.   Past Medical History:  Diagnosis Date   Dyslipidemia    Lymphoma (HCC) 03/2021   Seizures (HCC) 2007   2007-last    Objective: Neurovascular status intact.  Skin is warm, dry and supple. Nail and respective nail fold appears to be healing appropriately.   Assessment: #1 s/p partial permanent nail matrixectomy lateral border right great toe.  Dr. Charlsie Merles.  Date of procedure 09/07/2023   Plan of care: #1 patient was evaluated  #2 light debridement of the periungual debris was performed to the border of the respective toe and nail plate using a tissue nipper. #3  Continue oral antibiotics until completed prescribed from the urgent care #4 patient is to return to clinic on a PRN basis.   Felecia Shelling, DPM [facility]  Dr. Felecia Shelling, DPM    [address] [place], [place] [address]                Office [phone]  Fax [phone]

## 2023-09-28 ENCOUNTER — Ambulatory Visit (INDEPENDENT_AMBULATORY_CARE_PROVIDER_SITE_OTHER): Admitting: Physician Assistant

## 2023-09-28 VITALS — BP 128/70 | HR 83 | Temp 97.3°F | Ht 66.0 in | Wt 181.0 lb

## 2023-09-28 DIAGNOSIS — F419 Anxiety disorder, unspecified: Secondary | ICD-10-CM

## 2023-09-28 DIAGNOSIS — F32A Depression, unspecified: Secondary | ICD-10-CM

## 2023-09-28 DIAGNOSIS — E559 Vitamin D deficiency, unspecified: Secondary | ICD-10-CM | POA: Diagnosis not present

## 2023-09-28 DIAGNOSIS — D509 Iron deficiency anemia, unspecified: Secondary | ICD-10-CM | POA: Diagnosis not present

## 2023-09-28 DIAGNOSIS — R5383 Other fatigue: Secondary | ICD-10-CM

## 2023-09-28 DIAGNOSIS — R41840 Attention and concentration deficit: Secondary | ICD-10-CM

## 2023-09-28 NOTE — Progress Notes (Signed)
 Katie Mcneil is a 73 y.o. female here for a new problem.  History of Present Illness:   Chief Complaint  Patient presents with   Referral    Pt would like a referral for Psychologist or Psychiatry to evaluate for ADD. Having trouble focusing for a long time.    HPI  Attention Difficulty   Patient complains of difficulty focusing for sometime now. She has been having difficulty getting things done and often forgetting to do this.   She states that she had to resign from her job in 2013 due to attention difficulties.  Patient reports compliance and good tolerance of Lamotrigine 300 mg  daily and wonders if this is contributing to her symptoms. She states that her symptoms improved since switching to extended release.   She believes that she has a certain level of ADHD throughout her life but she was able to manage it, however, symptoms have been worsening.  Also wonders if increased stress and having sleep difficulties contributing to her symptoms. She continues to stress over moving into a new house in 2021, stating that she doesn't feel comfortable in her current space.   She has tried several gummies/supplements to aid in focus with some relief.   Requesting a psychology referral today. Will recheck ferritin levels today.   Vitamin D deficiency Currently taking vitamin D  Would like levels checked  Anemia Currently taking no supplement Would like her iron levels rechecked to advise further  Past Medical History:  Diagnosis Date   Dyslipidemia    Lymphoma (HCC) 03/2021   Seizures (HCC) 2007   2007-last     Social History   Tobacco Use   Smoking status: Never   Smokeless tobacco: Never  Vaping Use   Vaping status: Never Used  Substance Use Topics   Alcohol use: Not Currently    Alcohol/week: 0.0 standard drinks of alcohol   Drug use: No    Past Surgical History:  Procedure Laterality Date   CATARACT EXTRACTION W/ INTRAOCULAR LENS IMPLANT Bilateral 2019    CESAREAN SECTION  1981, 1990   [facility]OSCOPY  2012   [facility]- normal   COLONOSCOPY WITH PROPOFOL  03/31/2023   [facility] at [facility]   NO PAST SURGERIES     TUBAL LIGATION      Family History  Problem Relation Age of Onset   Neuropathy Mother    Katie Mcneil Father    Leukemia Sister 41   Alzheimer's disease Sister 36   Dementia Brother    Heart attack Maternal Grandmother    CVA Maternal Grandfather    Dementia Paternal Grandmother    Seizures Daughter    Other Daughter        Psychological disorder   Colon cancer Neg Hx    Colon polyps Neg Hx    Esophageal cancer Neg Hx    Rectal cancer Neg Hx    Stomach cancer Neg Hx     No Known Allergies  Current Medications:   Current Outpatient Medications:    atorvastatin (LIPITOR) 40 MG tablet, TAKE 1 TABLET BY MOUTH EVERY DAY, Disp: 90 tablet, Rfl: 0   B Complex-C (VITAMIN B + C COMPLEX) TABS, , Disp: , Rfl:    cetirizine (ZYRTEC) 10 MG tablet, Take 10 mg by mouth daily., Disp: , Rfl:    LamoTRIgine 300 MG TB24 24 hour tablet, TAKE 1 TABLET BY MOUTH EVERY DAY, Disp: 90 tablet, Rfl: 3   mupirocin ointment (BACTROBAN) 2 %, Apply 1 Application topically 2 (two)  times daily., Disp: 22 g, Rfl: 0   Omega-3 Fatty Acids (FISH OIL) 1000 MG CAPS, Take 1 capsule by mouth daily in the afternoon., Disp: , Rfl:    Vitamin D, Cholecalciferol, 25 MCG (1000 UT) CAPS, Take 1 capsule by mouth daily in the afternoon., Disp: , Rfl:    Review of Systems:   ROS Negative unless otherwise specified per HPI.  Vitals:   Vitals:   09/28/23 1412  BP: 128/70  Pulse: 83  Temp: (!) 97.3 F (36.3 C)  TempSrc: Temporal  SpO2: 96%  Weight: 181 lb (82.1 kg)  Height: 5\' 6"  (1.676 m)     Body mass index is 29.21 kg/m.  Physical Exam:   Physical Exam Vitals and nursing note reviewed.  Constitutional:      General: She is not in acute distress.    Appearance: She is well-developed. She is not ill-appearing or toxic-appearing.  Cardiovascular:     Rate  and Rhythm: Normal rate and regular rhythm.     Pulses: Normal pulses.     Heart sounds: Normal heart sounds, S1 normal and S2 normal.  Pulmonary:     Effort: Pulmonary effort is normal.     Breath sounds: Normal breath sounds.  Skin:    General: Skin is warm and dry.  Neurological:     Mental Status: She is alert.     GCS: GCS eye subscore is 4. GCS verbal subscore is 5. GCS motor subscore is 6.  Psychiatric:        Speech: Speech normal.        Behavior: Behavior normal. Behavior is cooperative.     Assessment and Plan:   Iron deficiency anemia, unspecified iron deficiency anemia type Update blood work and advise accordingly  Vitamin D deficiency Update blood work and advise accordingly  Other fatigue UpToDate on all cancer screenings Recommend continued close follow up with oncology Update blood work Follow up based on clinical symptom(s) and results  Anxiety and depression; Attention or concentration deficit Ongoing Discussed different role of psychiatry vs psychology Discussed option of ADHD assessment and what an ADHD diagnosis would change for her After long discussion, she would like to pursue psychology referral for further evaluation I have also asked her to review with her neurologist her concerns of possible side effect(s) with lamotrigine   Zarinah Oviatt, PA-C  Scot Cutter M Kadhim,acting as a scribe for [name], PA.,have documented all relevant documentation on the behalf of [name], PA,as directed by  [name], PA while in the presence of [name], [name].   I, [name], [name], have reviewed all documentation for this visit. The documentation on 09/28/23 for the exam, diagnosis, procedures, and orders are all accurate and complete.

## 2023-09-28 NOTE — Patient Instructions (Signed)
 It was great to see you!  We will update blood work today  We will refer to psychology  Take care,  Hugh Garrow PA-C

## 2023-09-29 ENCOUNTER — Encounter: Payer: Self-pay | Admitting: Physician Assistant

## 2023-09-29 LAB — TSH: TSH: 1.32 u[IU]/mL (ref 0.35–5.50)

## 2023-09-29 LAB — IBC + FERRITIN
Ferritin: 20.8 ng/mL (ref 10.0–291.0)
Iron: 134 ug/dL (ref 42–145)
Saturation Ratios: 50.4 % — ABNORMAL HIGH (ref 20.0–50.0)
TIBC: 266 ug/dL (ref 250.0–450.0)
Transferrin: 190 mg/dL — ABNORMAL LOW (ref 212.0–360.0)

## 2023-09-29 LAB — VITAMIN D 25 HYDROXY (VIT D DEFICIENCY, FRACTURES): VITD: 34.93 ng/mL (ref 30.00–100.00)

## 2023-09-29 LAB — VITAMIN B12: Vitamin B-12: 635 pg/mL (ref 211–911)

## 2023-10-01 IMAGING — CT CT BIOPSY AND ASPIRATION BONE MARROW
1 of 2 series · 15 of 29 positions shown, 19 images · non-contrast
Comparison: none

INDICATION: 70-year-old female with history of lymphoma, bone marrow biopsy
requested for further workup.

[Series 2: i-spiral 5.0 br40 · axial · 0.92mm/px · z∈[+1161,+1280]mm · 15 of 38 slices shown, 19 images]
[im 2/38  mediastinal]
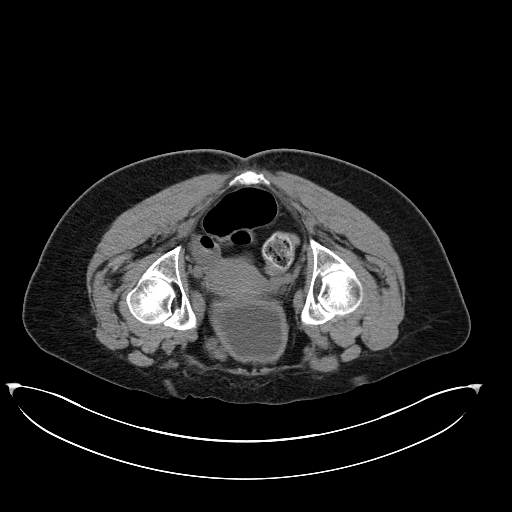
[im 2/38  lung]
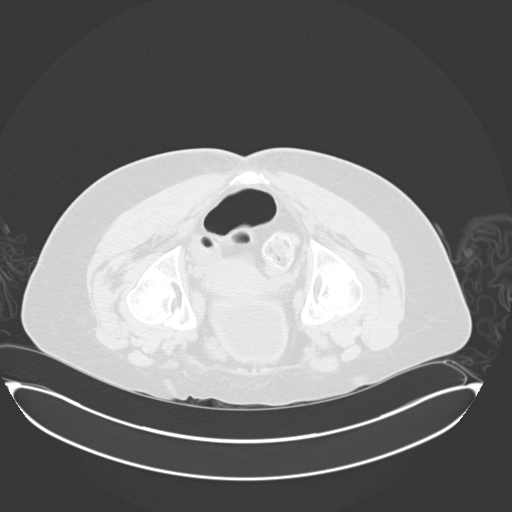
[im 5/38  lung]
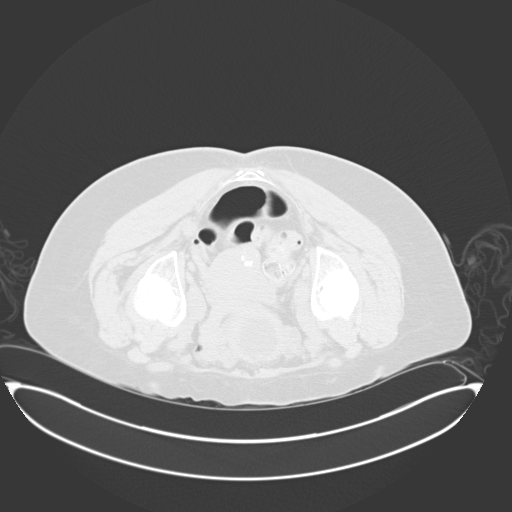
[im 7/38  lung]
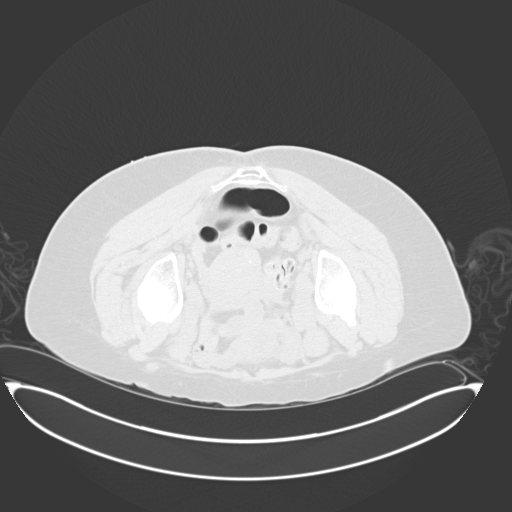
[im 10/38  lung]
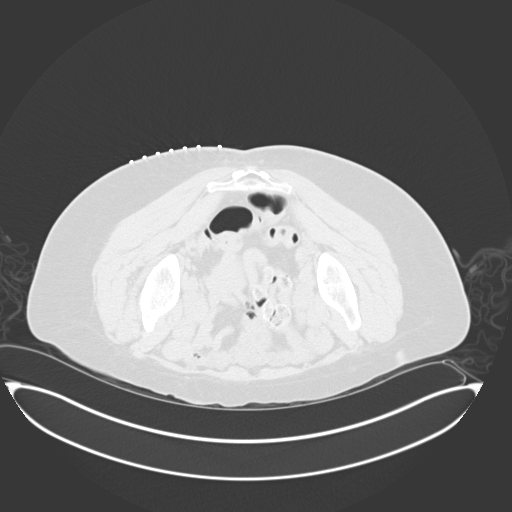
[im 12/38  mediastinal]
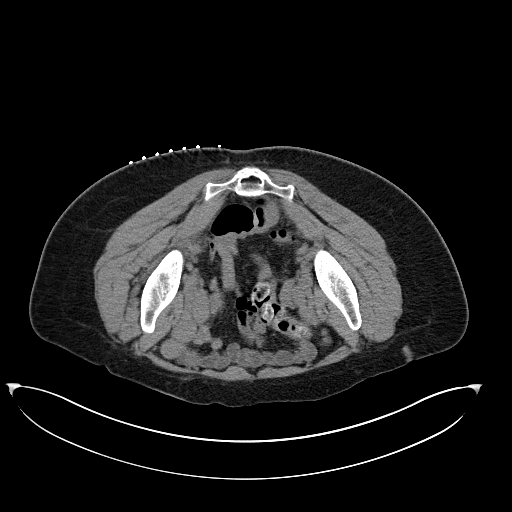
[im 12/38  lung]
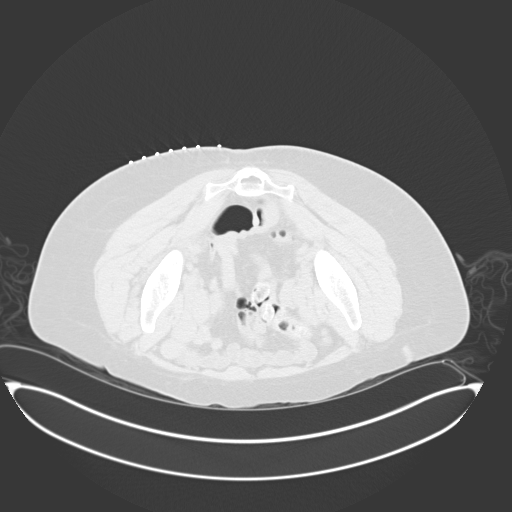
[im 15/38  lung]
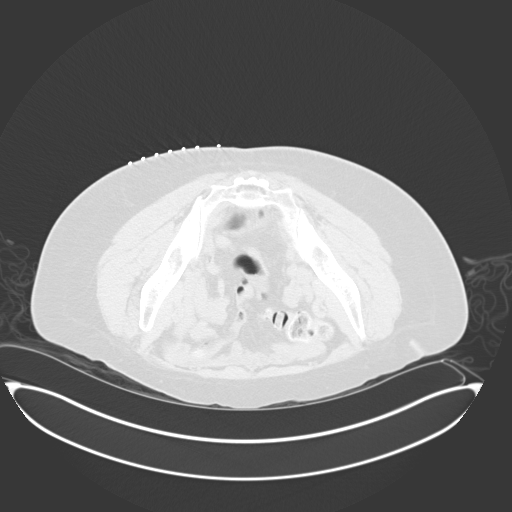
[im 17/38  lung]
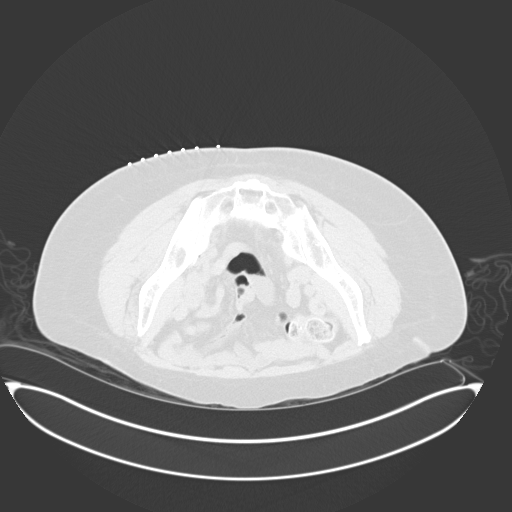
[im 19/38  lung]
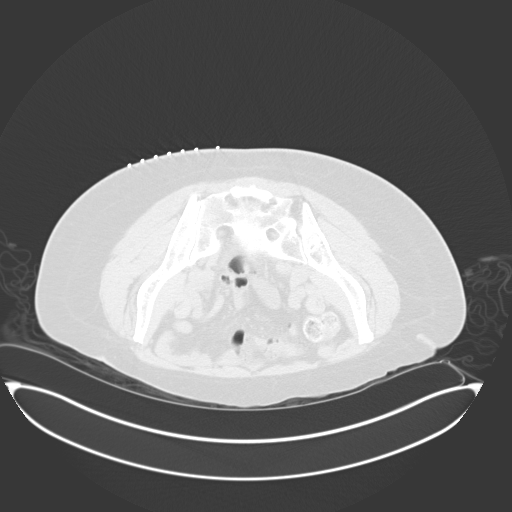
[im 21/38  mediastinal]
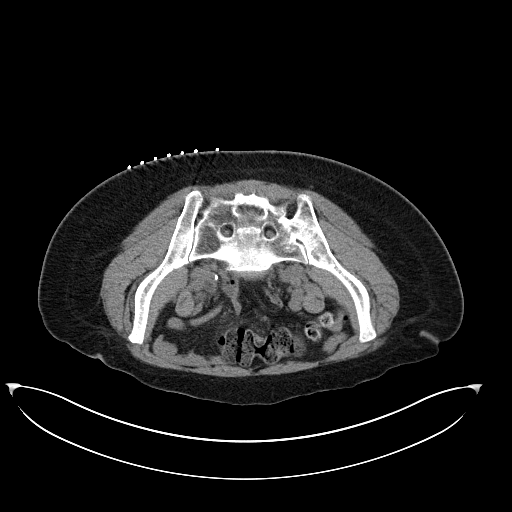
[im 21/38  lung]
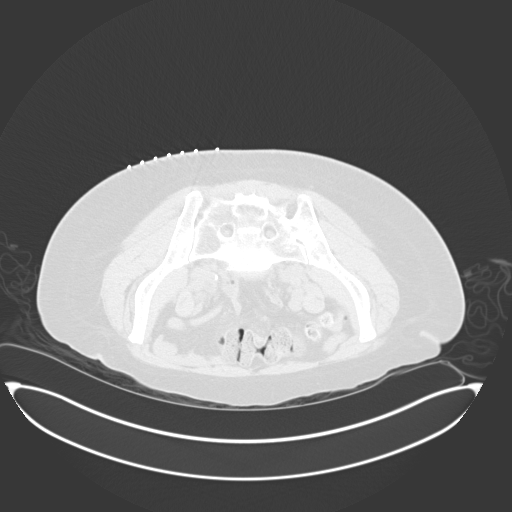
[im 23/38  lung]
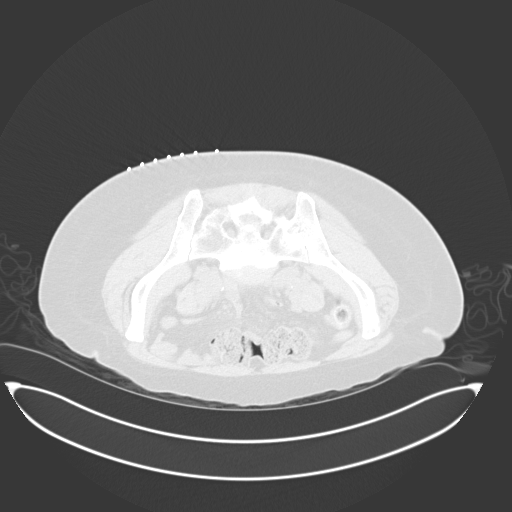
[im 26/38  lung]
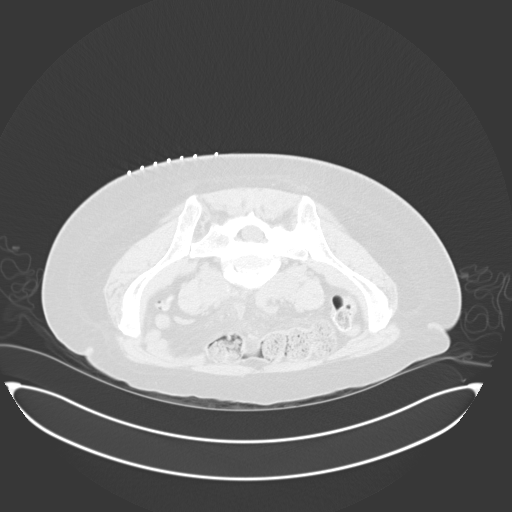
[im 28/38  lung]
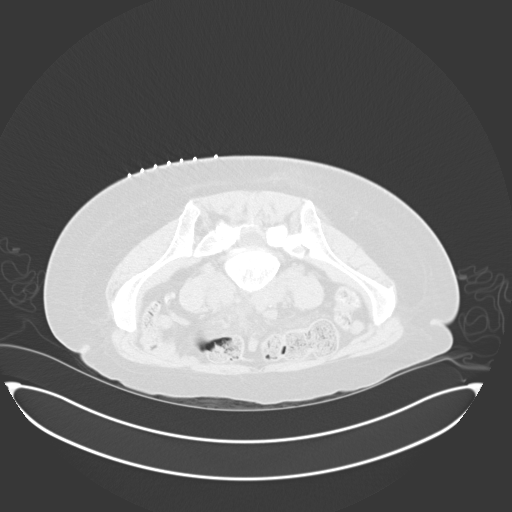
[im 31/38  mediastinal]
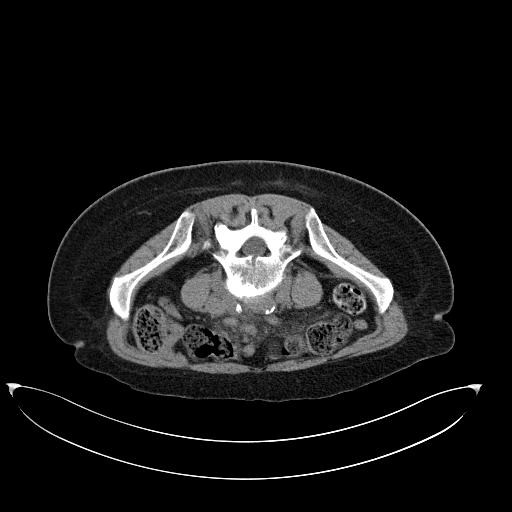
[im 31/38  lung]
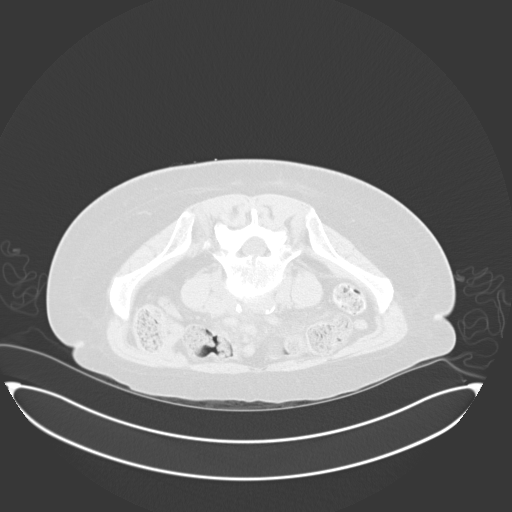
[im 33/38  lung]
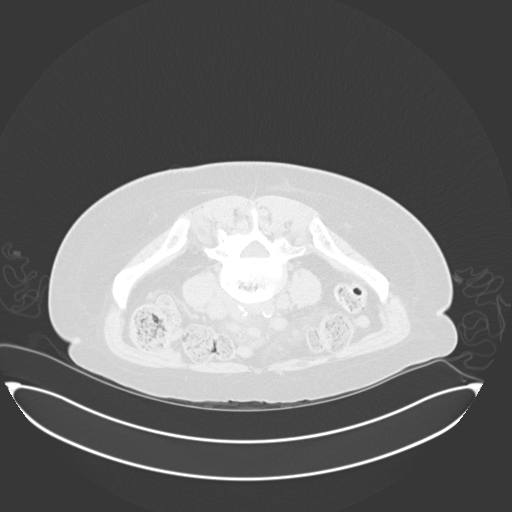
[im 36/38  lung]
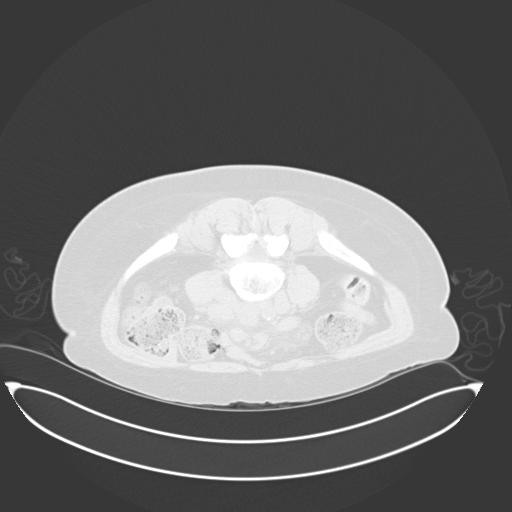

[15 of 29 positions shown; findings below may reference images not displayed]

EXAM:
CT-GUIDED BONE MARROW BIOPSY AND ASPIRATION

MEDICATIONS:
None

ANESTHESIA/SEDATION:
Fentanyl 100 mcg IV; Versed 2 mg IV

Sedation Time: 11 minutes; The patient was continuously monitored
during the procedure by the interventional radiology nurse under my
direct supervision.

COMPLICATIONS:
None immediate.

PROCEDURE:
Informed consent was obtained from the patient following an
explanation of the procedure, risks, benefits and alternatives. The
patient understands, agrees and consents for the procedure. All
questions were addressed. A time out was performed prior to the
initiation of the procedure.

The patient was positioned prone and non-contrast localization CT
was performed of the pelvis to demonstrate the iliac marrow spaces.
The operative site was prepped and draped in the usual sterile
fashion.

Under sterile conditions and local anesthesia, a 22 gauge spinal
needle was utilized for procedural planning. Next, an 11 gauge
coaxial bone biopsy needle was advanced into the right iliac marrow
space. Needle position was confirmed with CT imaging. Initially, a
bone marrow aspiration was performed. Next, a bone marrow biopsy was
obtained with the 11 gauge outer bone marrow device. Samples were
prepared with the cytotechnologist and deemed adequate. The needle
was removed and superficial hemostasis was obtained with manual
compression. A dressing was applied. The patient tolerated the
procedure well without immediate post procedural complication.
IMPRESSION: Successful CT guided right iliac bone marrow aspiration and core
biopsy.

## 2023-10-01 IMAGING — CT CT BIOPSY
1 of 2 series · 15 of 29 positions shown, 19 images · non-contrast
Comparison: none

INDICATION: 70-year-old female with history of lymphoma, bone marrow biopsy
requested for further workup.

[Series 2: i-spiral 5.0 br40 · axial · 0.92mm/px · z∈[+1161,+1280]mm · 15 of 38 slices shown, 19 images]
[im 2/38  mediastinal]
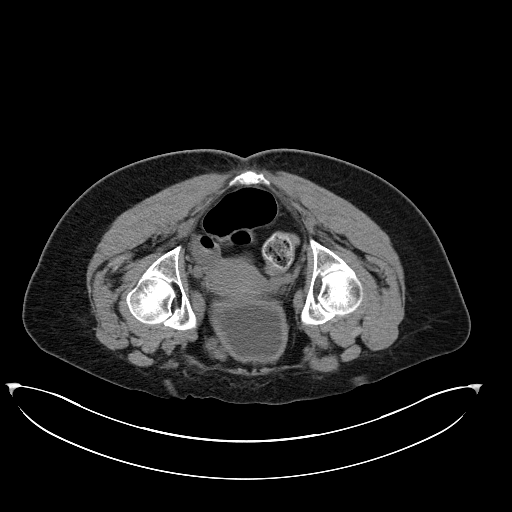
[im 2/38  lung]
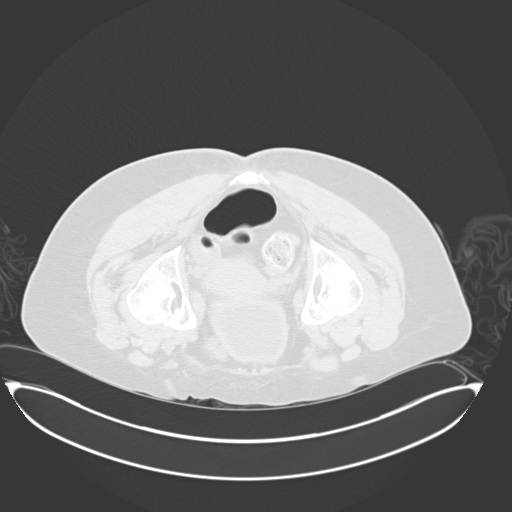
[im 5/38  lung]
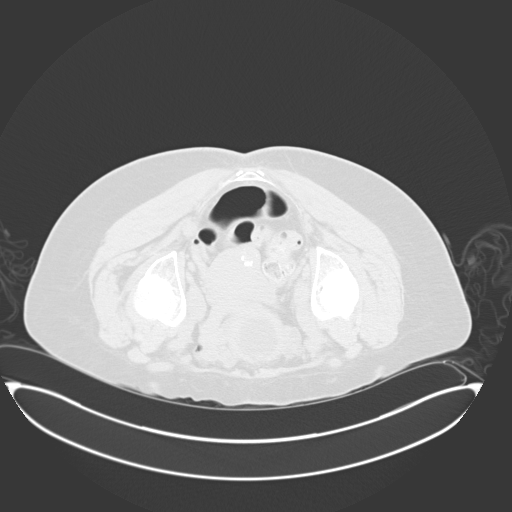
[im 7/38  lung]
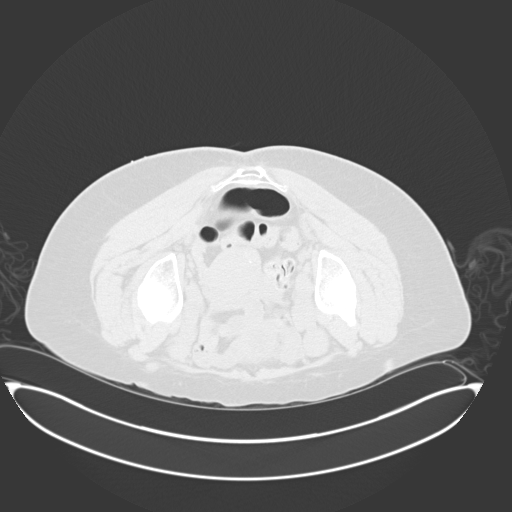
[im 10/38  lung]
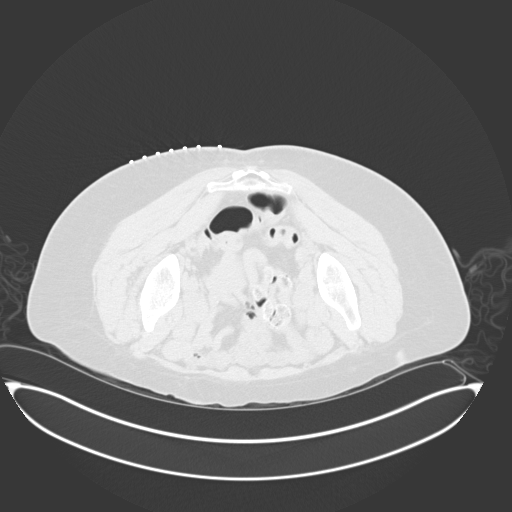
[im 12/38  mediastinal]
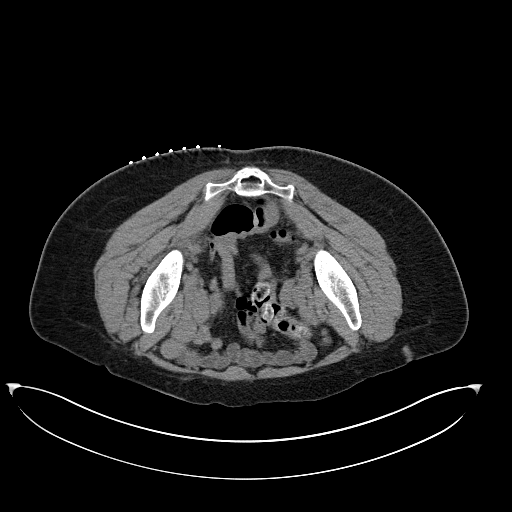
[im 12/38  lung]
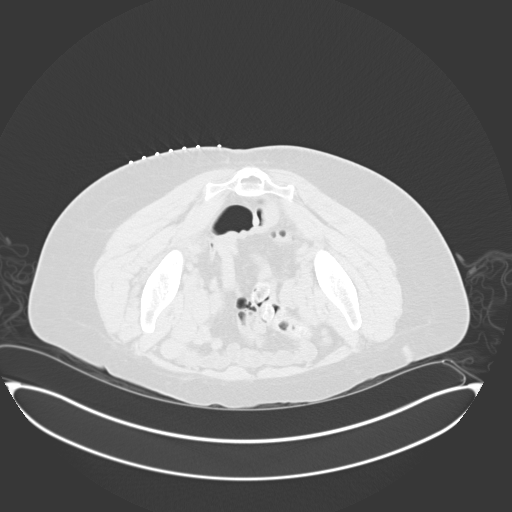
[im 15/38  lung]
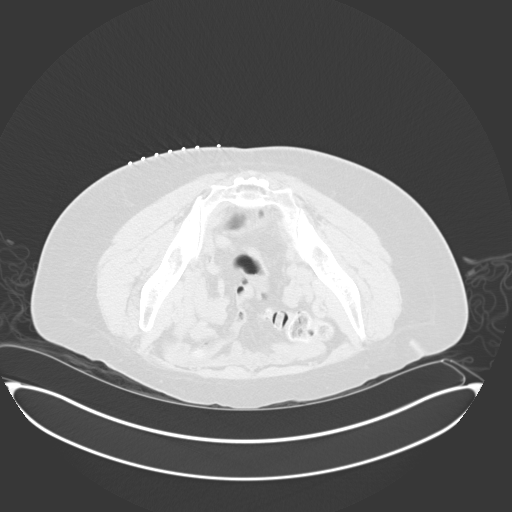
[im 17/38  lung]
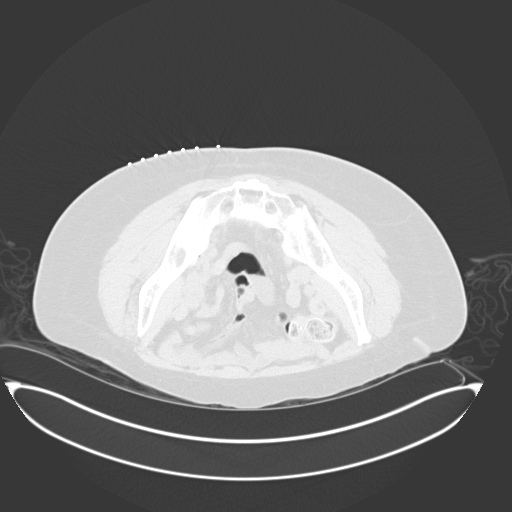
[im 19/38  lung]
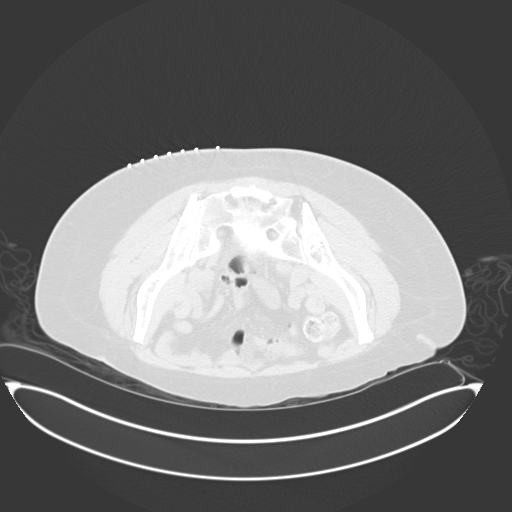
[im 21/38  mediastinal]
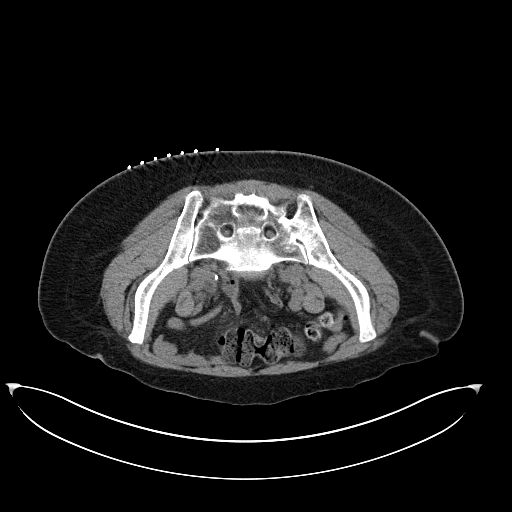
[im 21/38  lung]
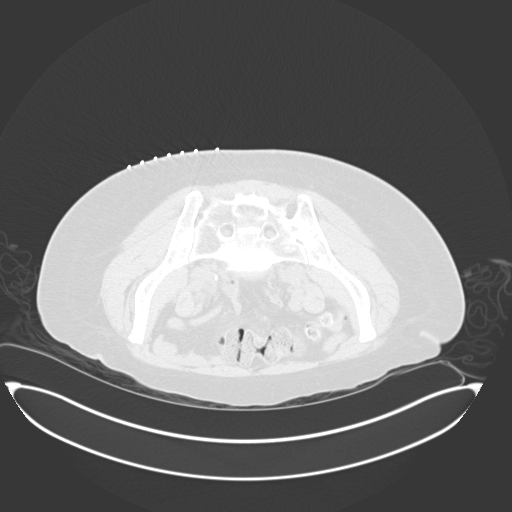
[im 23/38  lung]
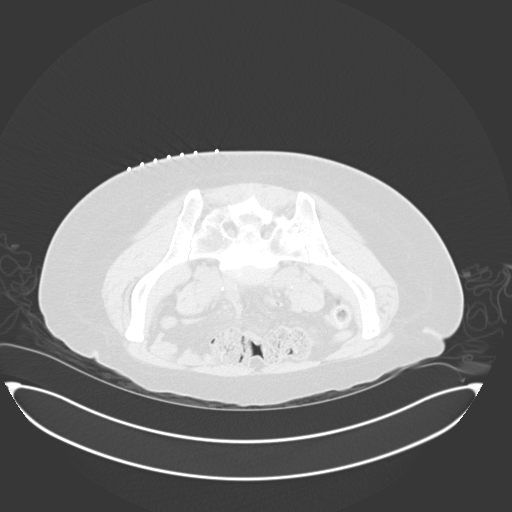
[im 26/38  lung]
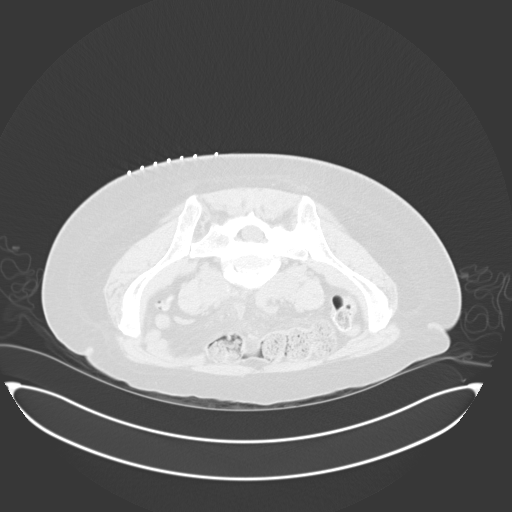
[im 28/38  lung]
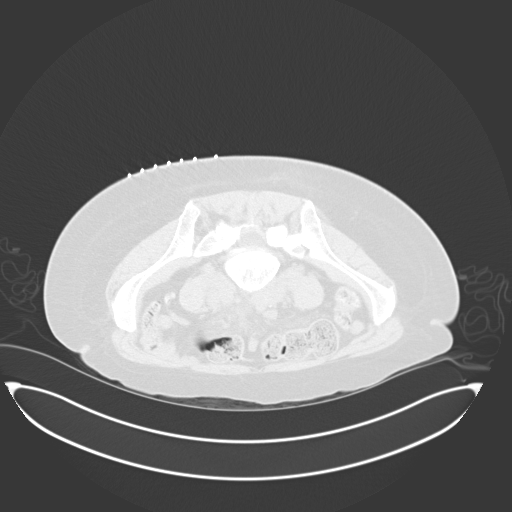
[im 31/38  mediastinal]
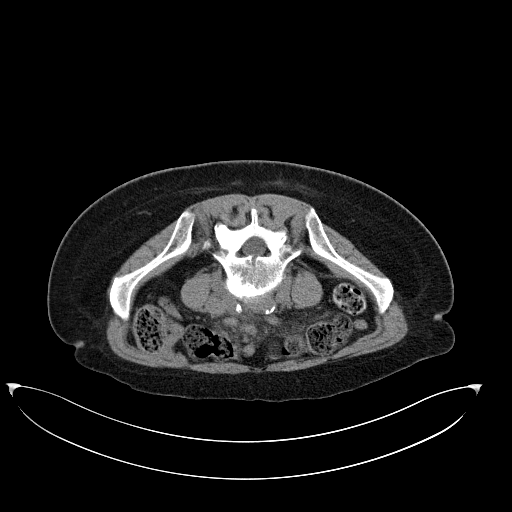
[im 31/38  lung]
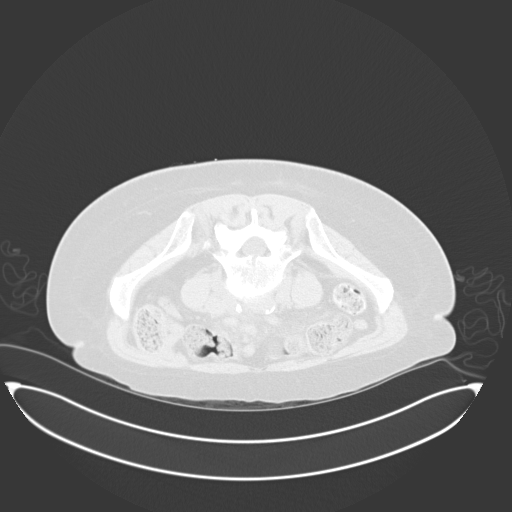
[im 33/38  lung]
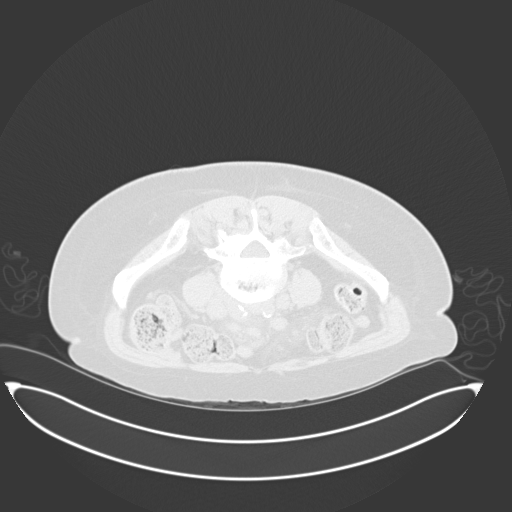
[im 36/38  lung]
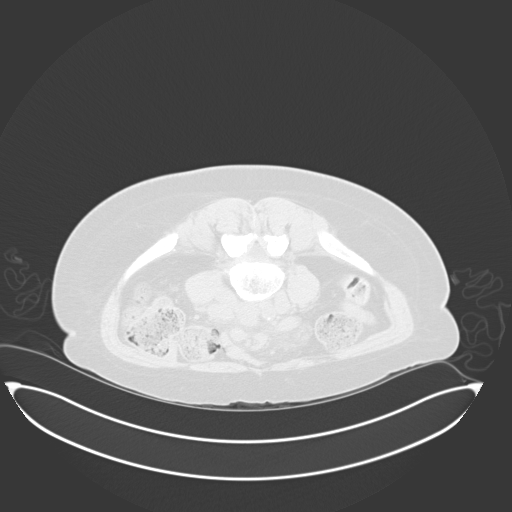

[15 of 29 positions shown; findings below may reference images not displayed]

EXAM:
CT-GUIDED BONE MARROW BIOPSY AND ASPIRATION

MEDICATIONS:
None

ANESTHESIA/SEDATION:
Fentanyl 100 mcg IV; Versed 2 mg IV

Sedation Time: 11 minutes; The patient was continuously monitored
during the procedure by the interventional radiology nurse under my
direct supervision.

COMPLICATIONS:
None immediate.

PROCEDURE:
Informed consent was obtained from the patient following an
explanation of the procedure, risks, benefits and alternatives. The
patient understands, agrees and consents for the procedure. All
questions were addressed. A time out was performed prior to the
initiation of the procedure.

The patient was positioned prone and non-contrast localization CT
was performed of the pelvis to demonstrate the iliac marrow spaces.
The operative site was prepped and draped in the usual sterile
fashion.

Under sterile conditions and local anesthesia, a 22 gauge spinal
needle was utilized for procedural planning. Next, an 11 gauge
coaxial bone biopsy needle was advanced into the right iliac marrow
space. Needle position was confirmed with CT imaging. Initially, a
bone marrow aspiration was performed. Next, a bone marrow biopsy was
obtained with the 11 gauge outer bone marrow device. Samples were
prepared with the cytotechnologist and deemed adequate. The needle
was removed and superficial hemostasis was obtained with manual
compression. A dressing was applied. The patient tolerated the
procedure well without immediate post procedural complication.
IMPRESSION: Successful CT guided right iliac bone marrow aspiration and core
biopsy.

## 2023-10-10 ENCOUNTER — Other Ambulatory Visit: Payer: Self-pay | Admitting: *Deleted

## 2023-10-10 MED ORDER — LAMOTRIGINE ER 300 MG PO TB24: 1.0000 | ORAL_TABLET | Freq: Every day | ORAL | 1 refills | Status: DC

## 2023-10-10 NOTE — Telephone Encounter (Signed)
 Received prescription request from [facility] for Lamotrigine  ER 300 mg. I have sent refills to [facility]. [facility]. [facility] took it off automatic refill but didn't cancel Rx for right now in case pt needs emergency refill while waiting on mail order.

## 2023-10-27 ENCOUNTER — Ambulatory Visit (INDEPENDENT_AMBULATORY_CARE_PROVIDER_SITE_OTHER): Admitting: Psychology

## 2023-10-27 DIAGNOSIS — F4323 Adjustment disorder with mixed anxiety and depressed mood: Secondary | ICD-10-CM | POA: Diagnosis not present

## 2023-10-27 NOTE — Progress Notes (Signed)
   Katie Mcneil, [facility]

## 2023-10-27 NOTE — Progress Notes (Signed)
 [facility] Counselor Initial Adult Exam  Name: Katie Mcneil Date: 10/27/2023 MRN: [id number] DOB: May 17, 1951 PCP: [name], PA  Time spent: 8:08am-9:01am  Pt is seen for an in person visit.   Guardian/Payee:  self    Paperwork requested: No   Reason for Visit /Presenting Problem: Pt is referred by her PCP for increased stressors, difficulty focusing, anxiety and depression. Pt reports she had been concerned for her difficulty focusing and completing things around the house.  Pt discussed her main stressor is her adult daughter and her failure to launch.  Pt reports her daughter has been struggling w/ mental health since age 15y/o and started counseling at that time.  Pt reports she cannot maintain a job, has severe anxiety and spending problem.  Pt reports she is now supporting her daughter and herself on her fixed retirement income.  Pt regrets selling her home to move into a townhome together w/ the initial agreement to split costs.  Her daughter has had 2 jobs in past year that didn't last more than a month.    Mental Status Exam: Appearance:   Well Groomed     Behavior:  Appropriate  Motor:  Normal  Speech/Language:   Clear and Coherent  Affect:  Appropriate  Mood:  anxious and depressed  Thought process:  normal  Thought content:    WNL  Sensory/Perceptual disturbances:    WNL  Orientation:  oriented to person, place, time/date, and situation  Attention:  Good  Concentration:  Good  Memory:  WNL  Fund of knowledge:   Good  Insight:    Good  Judgment:   Good  Impulse Control:  Good    Reported Symptoms:  pt endorsed anxiety and worry about situation and difficulty letting go of past decision.  Pt reports struggles w/ feeling fatigued.  Pt reports regret for her decision and resentment towards her daughter.  Pt reports gets worn from her daughter nagging her to buy things.   Pt reports difficulty concentrating and getting things done.  Pt reports feeling  helpless with situation.    Risk Assessment: Danger to Self:  No Self-injurious Behavior: No Danger to Others: No Duty to Warn:no Physical Aggression / Violence:No  Access to Firearms a concern: No  Gang Involvement:No  Patient / guardian was educated about steps to take if suicide or homicide risk level increases between visits: yes While future psychiatric events cannot be accurately predicted, the patient does not currently require acute inpatient psychiatric care and does not currently meet [place]  involuntary commitment criteria.  Substance Abuse History: Current substance abuse: No     Past Psychiatric History:   No previous psychological problems have been observed Outpatient Providers:no hx of counseling History of Psych Hospitalization: No  Psychological Testing: none   Abuse History:  Victim of: No., none   Report needed: No. Victim of Neglect:No. Perpetrator of none  Witness / Exposure to Domestic Violence: No   Protective Services Involvement: No  Witness to [employer] Violence:  No   Family History:  Family History  Problem Relation Age of Onset   Neuropathy Mother    Anuerysm Father    Leukemia Sister 40   Alzheimer's disease Sister 34   Dementia Brother    Heart attack Maternal Grandmother    CVA Maternal Grandfather    Dementia Paternal Grandmother    Seizures Daughter    Other Daughter        Psychological disorder   Colon cancer Neg Hx  Colon polyps Neg Hx    Esophageal cancer Neg Hx    Rectal cancer Neg Hx    Stomach cancer Neg Hx     Living situation: the patient in a townhouse w/ her daughter.  They moved in together in 11-25-2019 as her daughter could no longer afford her condo.  Pt sold her home and a lot of her furniture to make this transition.  Pt regrets her decision.    Sexual Orientation: Straight  Relationship Status: widowed. Pt husband died in Nov 25, 2003 from brain cancer.    If a parent, number of children / ages:2 daughters-  35y/o daughter lives w/ her and her 46y/o daughter lives in [place].    Support Systems: friends  Financial Stress:  Yes   Income/Employment/Disability: Product manager.  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 Pt reports she is considering returning to work due to financial stressors.   Military Service: No   Educational History: Education: Risk manager: Not reported  Any cultural differences that may affect / interfere with treatment:  not applicable   Recreation/Hobbies: not reported  Stressors: Surveyor, quantity difficulties   Other: Daughter's mental health    Strengths: Able to Communicate Effectively  Barriers:  none   Legal History: Pending legal issue / charges: The patient has no significant history of legal issues. History of legal issue / charges: none  Medical History/Surgical History: reviewed Past Medical History:  Diagnosis Date   Dyslipidemia    Lymphoma (HCC) 03/2021   Seizures (HCC) 2005/11/24   2007-last    Past Surgical History:  Procedure Laterality Date   CATARACT EXTRACTION W/ INTRAOCULAR LENS IMPLANT Bilateral 11/24/2017   CESAREAN SECTION  1981, 1990   COLONOSCOPY  November 25, 2010   [facility]- [facility]   COLONOSCOPY WITH PROPOFOL   03/31/2023   [facility] Crea at [facility]   NO PAST SURGERIES     TUBAL LIGATION      Medications: Current Outpatient Medications  Medication Sig Dispense Refill   atorvastatin  (LIPITOR) 40 MG tablet TAKE 1 TABLET BY MOUTH EVERY DAY 90 tablet 0   B Complex-C (VITAMIN B + C COMPLEX) TABS      cetirizine (ZYRTEC) 10 MG tablet Take 10 mg by mouth daily.     LamoTRIgine  300 MG TB24 24 hour tablet Take 1 tablet (300 mg total) by mouth daily. 90 tablet 1   mupirocin  ointment (BACTROBAN ) 2 % Apply 1 Application topically 2 (two) times daily. 22 g 0   Omega-3 Fatty Acids (FISH OIL) 1000 MG CAPS Take 1 capsule by mouth daily in the afternoon.     Vitamin D , Cholecalciferol, 25 MCG (1000 UT)  CAPS Take 1 capsule by mouth daily in the afternoon.     No current facility-administered medications for this visit.    No Known Allergies  Diagnoses:  Adjustment disorder with mixed anxiety and depressed mood  Plan of Care: Pt is a 72y/o widowed female seeking counseling for stress, anxiety, and depression.  Pt reports significant stressors w/ daughter's mental health, financial burden and non independence.  Pt has made major life decisions to meet daughter's needs.  Pt seeking support and guidance through coping w/ stressors and decisions moving forward.  Pt to f/u w/ counseling in 2-3 weeks.  Pt to f/u w/ her PCP and specialist as scheduled.  Pt and counselor will complete tx plan at next visit.    [name], LCMHC

## 2023-11-17 ENCOUNTER — Ambulatory Visit: Admitting: Psychology

## 2023-11-17 DIAGNOSIS — F4323 Adjustment disorder with mixed anxiety and depressed mood: Secondary | ICD-10-CM

## 2023-11-17 NOTE — Progress Notes (Signed)
 [facility] Counselor/Therapist Progress Note  Patient ID: Katie Mcneil, MRN: [id number],    Date: 11/17/2023  Time Spent: 11:00am-12:00pm  Pt is seen for an in person visit.    Treatment Type: Individual Therapy  Reported Symptoms: Pt reports worry and feeling down and regret.  Mental Status Exam: Appearance:  Well Groomed     Behavior: Appropriate  Motor: Normal  Speech/Language:  Clear and Coherent  Affect: Appropriate  Mood: anxious and regretful  Thought process: normal  Thought content:   WNL  Sensory/Perceptual disturbances:   WNL  Orientation: oriented to person, place, time/date, and situation  Attention: Good  Concentration: Good  Memory: WNL  Fund of knowledge:  Good  Insight:   Good  Judgment:  Good  Impulse Control: Good   Risk Assessment: Danger to Self:  No Self-injurious Behavior: No Danger to Others: No Duty to Warn:no Physical Aggression / Violence:No  Access to Firearms a concern: No  Gang Involvement:No   Subjective: Counselor assessed pt current functioning per pt report.  Developed tx plan together w/ pt.  Processed w/pt stressors and related emotions.  Discussed boundaries pt is trying to establish w/ Katie Mcneil.  Discussed difficulty in seeing Katie Mcneil unhappy and how she is not responsible for fixing daughters emotions and ways can be supportive.  Pt affect wnl.  Pt reports she is feeling really stressed by her Katie Mcneil's continued spending.  Pt discussed difficulty w/ setting boundaries and bailing Katie Mcneil out.  Pt discussed how hurts to see her Katie Mcneil unhappy.  Pt recognized that her actions of financially continuing to support Katie Mcneil's shopping addiction is not benefiting either.  Pt discussed ways to begin limiting financial support and ways to seek time for self care-outside of the house.    Interventions: Cognitive Behavioral Therapy, Assertiveness/Communication, and supportive  Diagnosis:Adjustment disorder with mixed  anxiety and depressed mood  Plan: f/u in 3 weeks for counseling. Pt to f/u as scheduled w/ PCP.   Individualized Treatment Plan Strengths: enjoys reading  Supports: friends   Goal/Needs for Treatment:  In order of importance to patient 1) decrease rumination and regret 2) set boundaries w/ Katie Mcneil 3) ---   Client Statement of Needs: "Pt wants to learn how to not dwell on regret and resentment about moving/past decisions and just be able to not enable (her Katie Mcneil) and her spending addiction."   Treatment Level:outpatient counseling  Symptoms: anxiety, worry, rumination about past decisions, regret, fatigue and difficulty concentrating.   Client Treatment Preferences: counseling about every 3 weeks.     Healthcare consumer's goal for treatment:  Counselor, [name], [facility] will support the patient's ability to achieve the goals identified. Cognitive Behavioral Therapy, Assertive Communication/Conflict Resolution Training, Relaxation Training, ACT, Humanistic and other evidenced-based practices will be used to promote progress towards healthy functioning.   Healthcare consumer will: Actively participate in therapy, working towards healthy functioning.    *Justification for Continuation/Discontinuation of Goal: R=Revised, O=Ongoing, A=Achieved, D=Discontinued  Goal 1) Pt to increase coping w/ current stressors and past decisions and decrease regret and rumination through develop self compassion and acceptance AEB pt report and therapist observation. Baseline date 11/17/23: Progress towards goal 0; How Often - Daily Target Date Goal Was reviewed Status Code Progress towards goal/Likert rating  11/16/24                Goal 2) Identify and implement healthy boundaries to assert with Katie Mcneil to decrease related financial stressors and increase positive interactions AEB pt and therapist observation. Baseline date  11/17/23: Progress towards goal 0; How Often - Daily Target Date Goal Was  reviewed Status Code Progress towards goal  11/16/24                  This plan has been reviewed and created by the following participants:  This plan will be reviewed at least every 12 months. Date Behavioral Health Clinician Date Guardian/Patient   11/17/23  Simonne [facility]on[facility] [name] 11/17/23 Verbal Consent Provided and signature on file                   [place], LCMHC

## 2023-11-17 NOTE — Progress Notes (Deleted)
   Katie Mcneil, [facility]

## 2023-11-22 ENCOUNTER — Other Ambulatory Visit: Payer: Self-pay

## 2023-11-22 DIAGNOSIS — C8298 Follicular lymphoma, unspecified, lymph nodes of multiple sites: Secondary | ICD-10-CM

## 2023-11-22 NOTE — Progress Notes (Signed)
 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   Patient was last seen by me on 05/25/2023 and reported fatigue, SOB sometimes with activity, new left cervical lymph node, and fluctuating abdominal tightness with sense of fullness sometimes.  She reports that she has been doing well overall over the last 6 months with no new concerns.   Patient reports fatigue, which sometimes improves, but has increased in the last couple of weeks.   She reports that she generally tries to stay physically active.   Patient is not having periods. She denies any black stools, blood in stools, hemoorohoids, or hematuria.   She is UTD with her colonoscopies and notes that her most recent colonoscopy was last year.   Patient denies being on any acid suppressants for acid reflux.   She notes that she is not a vegetarian but does not consume meat daily.   She reports that she has been taking vitamin B complex daily.   Patient denies any fever, chills, night sweats, new lumps/bumps, change in breathing, change in bowel habits, new skin rashes  She is UTD with her mammogram.   She generally seens her PCP once a year around the end of the year.    MEDICAL HISTORY:  Past Medical History:  Diagnosis Date   Dyslipidemia    Lymphoma (HCC) 03/2021   Seizures (HCC) 2007   2007-last    SURGICAL HISTORY: Past Surgical History:  Procedure Laterality Date   CATARACT EXTRACTION W/ INTRAOCULAR LENS IMPLANT Bilateral 2019   CESAREAN SECTION  1981, 1990   [facility]OSCOPY  2012   [facility]- [facility]    COLONOSCOPY WITH PROPOFOL   03/31/2023   S[facility]ann Crea at [facility]   NO PAST SURGERIES     TUBAL LIGATION      SOCIAL HISTORY: Social History   Socioeconomic History   Marital status: Widowed    Spouse name: Not on file   Number of children: 2   Years of education: Not on file   Highest education level: Master's degree (e.g., MA, MS, MEng, MEd, MSW, MBA)  Occupational History   Not on file  Tobacco Use   Smoking status: Never   Smokeless tobacco: Never  Vaping Use   Vaping status: Never Used  Substance and Sexual Activity   Alcohol use: Not Currently    Alcohol/week: 0.0 standard drinks of alcohol   Drug use: No   Sexual activity: Not Currently    Birth control/protection: Post-menopausal  Other Topics Concern   Not on file  Social History Narrative   Retired from working in Education officer, environmental at a [employer]   Lives with daughter (who has mental health issues)   Has two children   Widowed   Social Drivers of Corporate investment banker Strain: Low Risk  (09/28/2023)   Overall Financial Resource Strain (CARDIA)    Difficulty of Paying Living Expenses: Not hard at all  Food Insecurity: No Food Insecurity (09/28/2023)   Hunger Vital Sign    Worried About Running Out of Food in the Last Year: Never true    Ran Out of Food in the Last Year: Never true  Transportation Needs: No Transportation Needs (09/28/2023)   PRAPARE - Administrator, Civil Service (Medical): No    Lack of Transportation (Non-Medical): No  Physical Activity: Insufficiently Active (09/28/2023)   Exercise Vital Sign    Days of Exercise per Week: 2 days    Minutes of Exercise per Session: 30 min  Stress: No Stress Concern Present (09/28/2023)   [employer] of Occupational Health - Occupational Stress Questionnaire    Feeling of Stress : Only a little  Social Connections: Moderately Integrated (09/28/2023)   Social Connection and Isolation Panel    Frequency of Communication with Friends and Family:  More than three times a week    Frequency of Social Gatherings with Friends and Family: More than three times a week    Attends Religious Services: More than 4 times per year    Active Member of [employer] or Organizations: Yes    Attends Banker Meetings: More than 4 times per year    Marital Status: Widowed  Intimate Partner Violence: Not At Risk (05/16/2023)   Humiliation, Afraid, Rape, and Kick questionnaire    Fear of Current or Ex-Partner: No    Emotionally Abused: No    Physically Abused: No    Sexually Abused: No    FAMILY HISTORY: Family History  Problem Relation Age of Onset   Neuropathy Mother    Anuerysm Father    Leukemia Sister 16   Alzheimer's disease Sister 43   Dementia Brother    Heart attack Maternal Grandmother    CVA Maternal Grandfather    Dementia Paternal Grandmother    Seizures Daughter    Other Daughter        Psychological disorder   Colon cancer Neg Hx    Colon polyps Neg Hx    Esophageal cancer Neg Hx    Rectal cancer Neg Hx    Stomach cancer Neg Hx     ALLERGIES:  has no known allergies.  MEDICATIONS:  Current Outpatient Medications  Medication Sig Dispense Refill   atorvastatin  (LIPITOR) 40 MG tablet TAKE 1 TABLET BY MOUTH EVERY DAY 90 tablet 0   B Complex-C (VITAMIN B + C COMPLEX) TABS      cetirizine (ZYRTEC) 10 MG tablet Take 10 mg by mouth daily.     LamoTRIgine  300 MG TB24 24 hour tablet Take 1 tablet (300 mg total) by mouth daily. 90 tablet 1   mupirocin  ointment (BACTROBAN ) 2 % Apply 1 Application topically 2 (two) times daily. 22 g 0   Omega-3 Fatty Acids (FISH OIL) 1000 MG CAPS Take 1 capsule by mouth daily in the afternoon.     Vitamin D , Cholecalciferol, 25 MCG (1000 UT) CAPS Take 1 capsule by mouth daily in the afternoon.     No current facility-administered medications for this visit.    10 Point review of Systems was done is negative except as noted above.   PHYSICAL EXAMINATION: ECOG PERFORMANCE STATUS: 1 -  Symptomatic but completely ambulatory  Vitals:   11/23/23 1248  BP: 125/72  Pulse: 74  Resp: 18  Temp: 97.9 F (36.6 C)  SpO2: 98%     Filed Weights   11/23/23 1248  Weight: 179 lb 12.8 oz (81.6 kg)    .Body mass index is 29.02 kg/m.  Aaron Aas GENERAL:alert, in no acute distress and comfortable SKIN: no acute rashes, no significant lesions EYES: conjunctiva are pink and non-injected, sclera anicteric OROPHARYNX: MMM, no exudates, no oropharyngeal erythema or ulceration NECK: supple,  no JVD LYMPH:  unchanged small b/l  LUNGS: clear to auscultation b/l with normal respiratory effort HEART: regular rate & rhythm ABDOMEN:  normoactive bowel sounds , non tender, not distended. Extremity: no pedal edema PSYCH: alert & oriented x 3 with fluent speech NEURO: no focal motor/sensory deficits  LABORATORY DATA:  I have reviewed the data as listed  .    Latest Ref Rng & Units 11/23/2023   12:04 PM 05/25/2023   12:37 PM 12/27/2022   12:33 PM  CBC  WBC 4.0 - 10.5 K/uL 3.9  4.5  5.2   Hemoglobin 12.0 - 15.0 g/dL 40.9  81.1  91.4   Hematocrit 36.0 - 46.0 % 36.1  36.2  34.6   Platelets 150 - 400 K/uL 262  248  237    .CBC    Component Value Date/Time   WBC 3.9 (L) 11/23/2023 1204   WBC 5.3 12/03/2021 0927   RBC 4.20 11/23/2023 1204   HGB 11.7 (L) 11/23/2023 1204   HGB 11.3 12/14/2018 1017   HCT 36.1 11/23/2023 1204   HCT 31.5 (L) 04/02/2021 1304   PLT 262 11/23/2023 1204   PLT 289 12/14/2018 1017   MCV 86.0 11/23/2023 1204   MCV 87 12/14/2018 1017   MCH 27.9 11/23/2023 1204   MCHC 32.4 11/23/2023 1204   RDW 13.7 11/23/2023 1204   RDW 13.6 12/14/2018 1017   LYMPHSABS 1.2 11/23/2023 1204   LYMPHSABS 1.3 12/14/2018 1017   MONOABS 0.5 11/23/2023 1204   EOSABS 0.2 11/23/2023 1204   EOSABS 0.3 12/14/2018 1017   BASOSABS 0.1 11/23/2023 1204   BASOSABS 0.0 12/14/2018 1017       Latest Ref Rng & Units 11/23/2023   12:04 PM 05/25/2023   12:37 PM 12/27/2022   12:33 PM  CMP   Glucose 70 - 99 mg/dL 99  90  87   BUN 8 - 23 mg/dL 10  9  11    Creatinine 0.44 - 1.00 mg/dL 7.82  9.56  2.13   Sodium 135 - 145 mmol/L 142  141  141   Potassium 3.5 - 5.1 mmol/L 4.0  4.1  4.1   Chloride 98 - 111 mmol/L 109  109  109   CO2 22 - 32 mmol/L 28  27  25    Calcium  8.9 - 10.3 mg/dL 9.3  9.4  9.4   Total Protein 6.5 - 8.1 g/dL 6.9  7.1  7.0   Total Bilirubin 0.0 - 1.2 mg/dL 0.6  0.7  0.8   Alkaline Phos 38 - 126 U/L 87  79  76   AST 15 - 41 U/L 19  21  24    ALT 0 - 44 U/L 16  17  19     . Lab Results  Component Value Date   LDH 139 11/23/2023   Component     Latest Ref Rng & Units 04/02/2021  Iron     28 - 170 ug/dL 48  TIBC     086 - 578 ug/dL 469 (L)  Saturation Ratios     10.4 - 31.8 % 20  UIBC     ug/dL 629  Folate, Hemolysate     Not Estab. ng/mL 287.0  HCT     34.0 - 46.6 % 31.5 (L)  Folate, RBC     >498 ng/mL 911  DAT, complement      NEG  DAT, IgG      NEG . . .  Hep B Core Total Ab  NON REACTIVE NON REACTIVE  Hepatitis B Surface Ag     NON REACTIVE NON REACTIVE  HCV Ab     NON REACTIVE NON REACTIVE  Haptoglobin     37 - 355 mg/dL 161  Ferritin     11 - 307 ng/mL 131  Vitamin B12     180 - 914 pg/mL 789  LDH     98 - 192 U/L 181    SURGICAL PATHOLOGY  CASE: [id number]  PATIENT: Aliyah Pavlich  Surgical Pathology Report      Clinical History: diffuse LAN, no primary, R/O lymphoma (cm)    FINAL MICROSCOPIC DIAGNOSIS:   A. LYMPH NODE, RIGHT INGUINAL, NEEDLE CORE BIOPSY:  -  Non-Hodgkin B-cell lymphoma  -  See comment   COMMENT:   The biopsy consists of three small lymph node cores composed of a  monotonous proliferation of small to medium sized lymphocytes without  well-formed, back to back follicles.  By immunohistochemistry there is a  slight predominance of B cells by CD20 and CD3 immunohistochemistry.  The B cells appear to co-express CD79a, CD10, BCL6 and Bcl-2 both within  follicles and within the  interfollicular spaces. CD23 shows expanded  dendritic meshworks. The B-cells are negative for CD5 and cyclin D1.  Flow cytometry identified a kappa-restricted monoclonal B-cell  population expressing CD10 (42% of all lymphocytes; See [id number]).  Overall, the findings are consistent with a non-Hodgkin B-cell lymphoma,  most likely follicular lymphoma.  FISH for t(14;18) is pending and will  be reported in an addendum along with ki-67.  Unfortunately, given the  limited and fragmented nature of the sample accurate grading of the  lymphoma is not possible on the sample provided.   SURGICAL PATHOLOGY Surgical Pathology   THIS IS AN ADDENDUM REPORT   CASE: [id number]  PATIENT: Neva Bail  Bone Marrow Report  Addendum    Reason for Addendum #1:  Molecular Add-on   Clinical History: Follicular lymphoma of lymphoma nodes of multiple  regions, unspecified grade (HCC)  ,( BH)    DIAGNOSIS:   BONE MARROW, ASPIRATE, CLOT, CORE:  -  Hypercellular marrow involved by follicular lymphoma  -  See microscopic description and comment below   PERIPHERAL BLOOD:  -  Normocytic anemia  -  See complete blood cell count   COMMENT:   The combined morphologic and flow cytometric features are consistent  with bone marrow involvement by the patient's previously diagnosed  follicular lymphoma.  Correlation with cytogenetics is recommended.    ADDENDUM:   ** Please note this testing was performed and interpreted by an outside  facility ([facility]).  This addendum is only being added to provide a  summary of the results for report completeness.  Please see electronic  medical record for a copy of the full report. **   Karyotype:  46,X,t(X;18)(q13;q11.2),+der(X)t(X;18),add(6)(q12),-11,t(12;13)(q24.1;q1  2),t(14;18)(q32;q21)[2]/46,XX[18]   Interpretation:  ABNORMAL FEMALE KARYOTYPE   The abnormalities include loss of material from the long arm of  chromosome 6 and the t(14;18)(q32;q21)  - both recurring abnormalities in  lymphomas. The t(14;18)(q32;q21.3), involving the IGH gene at 14q32.3  and the BCL2 gene at 18q21.3, is observed in 90% of follicular  lymphomas.    RADIOGRAPHIC STUDIES: I have personally reviewed the radiological images as listed and agreed with the findings in the report. No results found.  ASSESSMENT & PLAN:   73 year old female with history of seizure disorder with  1) Recently diagnosed stage IVa follicular lymphoma PET/CT reviewed as noted above  2)  history of seizure disorder on Lamictal   3) normocytic anemia could be from lymphoma plus her recent pneumonia hemoglobin is improved to 11.7 from 11.2.  PLAN:  -Discussed lab results on 11/23/2023 in detail with patient. CBC showed WBC of 3.9K, hemoglobin of 11.7, and platelets of 262K. -Minimal anemia -WBCS normal -platelets normal -CMP normal -iron labs from today are pending -her iron labs with PCP on 09/28/2023 showed that her ferritin had gradually decreased to 20.  -her iron saturation was noted to be 50% one month ago despite ferritin 20. I discussed that we typically would not expect iron saturation 50% with ferritin 20. Discussed that sometimes, blood samples from an outside clinic could be sitting for a while, and hemolysis may occur during that time which releases iron and may affect lab results. Discussed importance of evaluating both ferritin and iron saturation parameters when evaluating iron.  -discussed that given her fatigue, and iron level is slightly low, it would be reasonable to take OTC iron replacement twice a week to optimize ferritin closer to 50-100 ng/mL -CT chest/abdomen/pelvis scan from 08/10/2023 showed no concerns. Her previous lymph nodes were noted to have decreased on their own without treatment -discussed that it is not uncommon in low-grade follicular lymphomas for there to be some fluctuation in lymph nodes -upon physical examination, her cervical lymph nodes  which were noted to be previously small last time, appear to have further decreased in size and are more difficult to palpate. She has no other enlarged lymph nodes.  -did not feel an enlarged liver or spleen during physical examination -there is no clinical sign or lab evidence suggestive of lymphoma progression at this time -No acute new indications to treat her low-grade follicular lymphoma at this time  -recommend eating well, sleeping well, staying physically active, and staying stress-free to optimize the immune system.  -recommend continuing to take vitamin B complex at least 3 days a week, or daily I she chooses to -recommend taking vitamin D  daily -continue to stay UTD with age-appropriate cancer screenings -discussed option of alternating visits with PCP every 6 months for continued monitoring since her follicular lymphoma is behaving well, or to continue following-up with us  every 6 months -Patient is agreeable to alternate visits with her PCP, Dr. Roark Chick, to monitor her CBC, CMP, and LDH -discussed that if patient has any concerns from a lymphoma standpoint, she shall let us  know immediately -answered all of patient's questions in detail  FOLLOW-UP: RTC with Wolfgang Hawthorn with labs in 6 months RTC with Dr Salomon Cree with labs in 12 months  The total time spent in the appointment was 30 minutes* .  All of the patient's questions were answered with apparent satisfaction. The patient knows to call the clinic with any problems, questions or concerns.   Jacquelyn Matt MD MS AAHIVMS [facility] Hematology/Oncology Physician [facility]  .*Total Encounter Time as defined by the Centers for Medicare and Medicaid Services includes, in addition to the face-to-face time of a patient visit (documented in the note above) non-face-to-face time: obtaining and reviewing outside history, ordering and reviewing medications, tests or procedures, care coordination (communications with other  health care professionals or caregivers) and documentation in the medical record.    I,Mitra Faeizi,acting as a Neurosurgeon for Jacquelyn Matt, MD.,have documented all relevant documentation on the behalf of Jacquelyn Matt, MD,as directed by  Jacquelyn Matt, MD while in the presence of Jacquelyn Matt, MD.  .I have reviewed the above documentation for accuracy and  completeness, and I agree with the above. .Kendal Ghazarian Kishore Janai Maudlin MD

## 2023-11-23 ENCOUNTER — Inpatient Hospital Stay: Payer: Medicare Other | Attending: Hematology

## 2023-11-23 ENCOUNTER — Inpatient Hospital Stay (HOSPITAL_BASED_OUTPATIENT_CLINIC_OR_DEPARTMENT_OTHER): Payer: Medicare Other | Admitting: Hematology

## 2023-11-23 VITALS — BP 125/72 | HR 74 | Temp 97.9°F | Resp 18 | Wt 179.8 lb

## 2023-11-23 DIAGNOSIS — Z79899 Other long term (current) drug therapy: Secondary | ICD-10-CM | POA: Diagnosis not present

## 2023-11-23 DIAGNOSIS — G40909 Epilepsy, unspecified, not intractable, without status epilepticus: Secondary | ICD-10-CM | POA: Diagnosis not present

## 2023-11-23 DIAGNOSIS — C8298 Follicular lymphoma, unspecified, lymph nodes of multiple sites: Secondary | ICD-10-CM

## 2023-11-23 DIAGNOSIS — D649 Anemia, unspecified: Secondary | ICD-10-CM | POA: Insufficient documentation

## 2023-11-23 DIAGNOSIS — C8290 Follicular lymphoma, unspecified, unspecified site: Secondary | ICD-10-CM | POA: Insufficient documentation

## 2023-11-23 LAB — CMP (CANCER CENTER ONLY)
ALT: 16 U/L (ref 0–44)
AST: 19 U/L (ref 15–41)
Albumin: 4.5 g/dL (ref 3.5–5.0)
Alkaline Phosphatase: 87 U/L (ref 38–126)
Anion gap: 5 (ref 5–15)
BUN: 10 mg/dL (ref 8–23)
CO2: 28 mmol/L (ref 22–32)
Calcium: 9.3 mg/dL (ref 8.9–10.3)
Chloride: 109 mmol/L (ref 98–111)
Creatinine: 0.66 mg/dL (ref 0.44–1.00)
GFR, Estimated: >60 mL/min (ref >60)
Glucose, Bld: 99 mg/dL (ref 70–99)
Potassium: 4 mmol/L (ref 3.5–5.1)
Sodium: 142 mmol/L (ref 135–145)
Total Bilirubin: 0.6 mg/dL (ref 0.0–1.2)
Total Protein: 6.9 g/dL (ref 6.5–8.1)

## 2023-11-23 LAB — IRON AND IRON BINDING CAPACITY (CC-WL,HP ONLY)
Iron: 79 ug/dL (ref 28–170)
Saturation Ratios: 29 % (ref 10.4–31.8)
TIBC: 270 ug/dL (ref 250–450)
UIBC: 191 ug/dL (ref 148–442)

## 2023-11-23 LAB — CBC WITH DIFFERENTIAL (CANCER CENTER ONLY)
Abs Immature Granulocytes: 0.01 10*3/uL (ref 0.00–0.07)
Basophils Absolute: 0.1 10*3/uL (ref 0.0–0.1)
Basophils Relative: 1 %
Eosinophils Absolute: 0.2 10*3/uL (ref 0.0–0.5)
Eosinophils Relative: 4 %
HCT: 36.1 % (ref 36.0–46.0)
Hemoglobin: 11.7 g/dL — ABNORMAL LOW (ref 12.0–15.0)
Immature Granulocytes: 0 %
Lymphocytes Relative: 30 %
Lymphs Abs: 1.2 10*3/uL (ref 0.7–4.0)
MCH: 27.9 pg (ref 26.0–34.0)
MCHC: 32.4 g/dL (ref 30.0–36.0)
MCV: 86 fL (ref 80.0–100.0)
Monocytes Absolute: 0.5 10*3/uL (ref 0.1–1.0)
Monocytes Relative: 13 %
Neutro Abs: 2 10*3/uL (ref 1.7–7.7)
Neutrophils Relative %: 52 %
Platelet Count: 262 10*3/uL (ref 150–400)
RBC: 4.2 MIL/uL (ref 3.87–5.11)
RDW: 13.7 % (ref 11.5–15.5)
WBC Count: 3.9 10*3/uL — ABNORMAL LOW (ref 4.0–10.5)
nRBC: 0 % (ref 0.0–0.2)

## 2023-11-23 LAB — FERRITIN: Ferritin: 43 ng/mL (ref 11–307)

## 2023-11-23 LAB — LACTATE DEHYDROGENASE: LDH: 139 U/L (ref 98–192)

## 2023-11-27 ENCOUNTER — Encounter: Payer: Self-pay | Admitting: Internal Medicine

## 2023-11-30 ENCOUNTER — Ambulatory Visit

## 2023-12-05 ENCOUNTER — Ambulatory Visit (INDEPENDENT_AMBULATORY_CARE_PROVIDER_SITE_OTHER)

## 2023-12-05 VITALS — BP 118/75 | HR 67 | Temp 98.6°F | Resp 20 | Ht 66.0 in | Wt 179.4 lb

## 2023-12-05 DIAGNOSIS — E7849 Other hyperlipidemia: Secondary | ICD-10-CM

## 2023-12-05 MED ORDER — INCLISIRAN SODIUM 284 MG/1.5ML ~~LOC~~ SOSY
284.0000 mg | PREFILLED_SYRINGE | Freq: Once | SUBCUTANEOUS | Status: AC
  Administered 2023-12-05: 284 mg via SUBCUTANEOUS
  Filled 2023-12-05: qty 1.5

## 2023-12-05 NOTE — Progress Notes (Signed)
 Diagnosis: Hyperlipidemia  Provider:  Mannam, Praveen MD  Procedure: Injection  Leqvio  (inclisiran), Dose: 284 mg, Site: subcutaneous, Number of injections: 1  Injection Site(s): Right arm  Post Care: Patient declined observation  Discharge: Condition: Good, Destination: Home . AVS Declined  Performed by:  Eleanor DELENA Bloch, RN

## 2023-12-15 ENCOUNTER — Ambulatory Visit: Admitting: Psychology

## 2024-02-15 ENCOUNTER — Telehealth (INDEPENDENT_AMBULATORY_CARE_PROVIDER_SITE_OTHER): Payer: Medicare Other | Admitting: Adult Health

## 2024-02-15 DIAGNOSIS — R569 Unspecified convulsions: Secondary | ICD-10-CM | POA: Diagnosis not present

## 2024-02-15 MED ORDER — LAMOTRIGINE ER 300 MG PO TB24
1.0000 | ORAL_TABLET | Freq: Every day | ORAL | 3 refills | Status: AC
Start: 2024-02-15 — End: ?

## 2024-02-15 NOTE — Progress Notes (Signed)
 PATIENT: Katie Mcneil DOB: September 20, 1950  REASON FOR VISIT: follow up HISTORY FROM: patient Primary Neurologist: Dr. Gregg  Virtual Visit via Video Note  I connected with Katie Mcneil on 02/15/24 at 10:30 AM EDT by a video enabled telemedicine application located remotely at [facility] and verified that I am speaking with the correct person using two identifiers who was located at their own home in [place]   I discussed the limitations of evaluation and management by telemedicine and the availability of in person appointments. The patient expressed understanding and agreed to proceed.   PATIENT: [name] DOB: 04-07-1951  REASON FOR VISIT: follow up HISTORY FROM: patient  HISTORY OF PRESENT ILLNESS: Today 02/15/24  Katie Mcneil is a 73 y.o. female with a history of seizures. Returns today for follow-up.  She reports that overall she has been doing well.  No seizure events.  Remains on Lamictal  extended release 30 mg daily.  Tolerates the medication well.  Denies any side effects.  She continues to operate a motor vehicle without difficulty.  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  No change in mood or behavior.  Last seizure was in 2006. She returns today for an evaluation.  HISTORY 02/14/23:   Katie Mcneil is a 73 y.o. female with a history of seizures. Returns today for follow-up. Denies any seizure events. Remains on Lamictal  ER 300 mg daily.  Denies any seizure events.  Overall she feels that she is doing well.  No change in her gait or balance.  No change in mood or behavior.  She returns today for an evaluation.     01/20/22: Katie Mcneil is a 73 year old female with a history of seizures.  She returns today for follow-up.  She remains on Lamictal  extended release 300 mg daily.  Denies any seizure events.Reports that retired in [place]. No changes with her gait or balance. Clemens last September 2022 but thinks it was because she was  moving too fast trying to get ready.  Reports that she has been diagnosed with lymphoma.  Currently they are just monitoring.  She returns today for an evaluation.   01/14/21: Katie Mcneil is a 73 year female with a history of seizures.  She returns today for follow-up.  She continues on Lamictal  extended release 300 mg daily.  She denies any seizure events.  Overall she feels that she is tolerating the medication well.  Denies any new symptoms.  No changes with her gait or balance.  Recently had blood work with her PCP   01/15/20: Katie Mcneil Is a 73 year old female with a hist4ory of seizures.  She returns today for follow-up.  She remains on Lamictal  extended release 300 mg daily.  She denies any seizure events.  Denies any changes with her gait or balance.  She does report that she is under a lot of stress right now with her daughter who is suffering from mental illness.  She also feels that she may have ADD but this has not been formally diagnosed.   HISTORY 12/14/18:   Katie Mcneil is a 73 year old female with a history of seizures.  She returns today for follow-up.  She remains on Lamictal  150 mg twice a day.  She is able to complete all ADLs independently.  She operates a Librarian, academic without difficulty.  She continues to work full-time.  She states that when she takes the full dose of Lamictal  at bedtime she feels drowsy the next morning.  She states that there is been times that she only took half a dose and she felt better the next morning.  She has not had any seizure events.  She reports that she has headaches but she feels that this is sinus related.  Reports that she has nasal congestion daily.  She returns today for follow-up  REVIEW OF SYSTEMS: Out of a complete 14 system review of symptoms, the patient complains only of the following symptoms, and all other reviewed systems are negative.  ALLERGIES: No Known Allergies  HOME MEDICATIONS: Outpatient Medications Prior to Visit  Medication Sig  Dispense Refill   atorvastatin  (LIPITOR) 40 MG tablet TAKE 1 TABLET BY MOUTH EVERY DAY 90 tablet 0   B Complex-C (VITAMIN B + C COMPLEX) TABS      cetirizine (ZYRTEC) 10 MG tablet Take 10 mg by mouth daily.     Omega-3 Fatty Acids (FISH OIL) 1000 MG CAPS Take 1 capsule by mouth daily in the afternoon.     Vitamin D , Cholecalciferol, 25 MCG (1000 UT) CAPS Take 1 capsule by mouth daily in the afternoon.     LamoTRIgine  300 MG TB24 24 hour tablet Take 1 tablet (300 mg total) by mouth daily. 90 tablet 1   mupirocin  ointment (BACTROBAN ) 2 % Apply 1 Application topically 2 (two) times daily. 22 g 0   No facility-administered medications prior to visit.    PAST MEDICAL HISTORY: Past Medical History:  Diagnosis Date   Dyslipidemia    Lymphoma (HCC) 03/2021   Seizures (HCC) 2007   2007-last    PAST SURGICAL HISTORY: Past Surgical History:  Procedure Laterality Date   CATARACT EXTRACTION W/ INTRAOCULAR LENS IMPLANT Bilateral 2019   CESAREAN SECTION  1981, 1990   [facility]OSCOPY  2012   [facility]- [facility]   COLONOSCOPY WITH PROPOFOL   03/31/2023   [facility]il at [facility]   NO PAST SURGERIES     TUBAL LIGATION      FAMILY HISTORY: Family History  Problem Relation Age of Onset   Neuropathy Mother    Anuerysm Father    Leukemia Sister 41   Alzheimer's disease Sister 46   Dementia Brother    Heart attack Maternal Grandmother    CVA Maternal Grandfather    Dementia Paternal Grandmother    Seizures Daughter    Other Daughter        Psychological disorder   Colon cancer Neg Hx    Colon polyps Neg Hx    Esophageal cancer Neg Hx    Rectal cancer Neg Hx    Stomach cancer Neg Hx     SOCIAL HISTORY: Social History   Socioeconomic History   Marital status: Widowed    Spouse name: Not on file   Number of children: 2   Years of education: Not on file   Highest education level: Master's degree (e.g., MA, MS, MEng, MEd, MSW, MBA)  Occupational History   Not on file  Tobacco Use   Smoking  status: Never   Smokeless tobacco: Never  Vaping Use   Vaping status: Never Used  Substance and Sexual Activity   Alcohol use: Not Currently    Alcohol/week: 0.0 standard drinks of alcohol   Drug use: No   Sexual activity: Not Currently    Birth control/protection: Post-menopausal  Other Topics Concern   Not on file  Social History Narrative   Retired from working in Education officer, environmental at a [employer]   Lives with daughter (who has mental health issues)  Has two children   Widowed   Social Drivers of Corporate investment banker Strain: Low Risk  (09/28/2023)   Overall Financial Resource Strain (CARDIA)    Difficulty of Paying Living Expenses: Not hard at all  Food Insecurity: No Food Insecurity (09/28/2023)   Hunger Vital Sign    Worried About Running Out of Food in the Last Year: Never true    Ran Out of Food in the Last Year: Never true  Transportation Needs: No Transportation Needs (09/28/2023)   PRAPARE - Administrator, Civil Service (Medical): No    Lack of Transportation (Non-Medical): No  Physical Activity: Insufficiently Active (09/28/2023)   Exercise Vital Sign    Days of Exercise per Week: 2 days    Minutes of Exercise per Session: 30 min  Stress: No Stress Concern Present (09/28/2023)   [employer] of Occupational Health - Occupational Stress Questionnaire    Feeling of Stress : Only a little  Social Connections: Moderately Integrated (09/28/2023)   Social Connection and Isolation Panel    Frequency of Communication with Friends and Family: More than three times a week    Frequency of Social Gatherings with Friends and Family: More than three times a week    Attends Religious Services: More than 4 times per year    Active Member of [employer] or Organizations: Yes    Attends Banker Meetings: More than 4 times per year    Marital Status: Widowed  Intimate Partner Violence: Not At Risk (05/16/2023)   Humiliation, Afraid, Rape, and Kick  questionnaire    Fear of Current or Ex-Partner: No    Emotionally Abused: No    Physically Abused: No    Sexually Abused: No      PHYSICAL EXAM Generalized: Well developed, in no acute distress   Neurological examination  Mentation: Alert oriented to time, place, history taking. Follows all commands speech and language fluent Cranial nerve II-XII: Facial symmetry noted.   DIAGNOSTIC DATA (LABS, IMAGING, TESTING) - I reviewed patient records, labs, notes, testing and imaging myself where available.  Lab Results  Component Value Date   WBC 3.9 (L) 11/23/2023   HGB 11.7 (L) 11/23/2023   HCT 36.1 11/23/2023   MCV 86.0 11/23/2023   PLT 262 11/23/2023      Component Value Date/Time   NA 142 11/23/2023 1204   NA 146 (H) 12/14/2018 1017   K 4.0 11/23/2023 1204   CL 109 11/23/2023 1204   CO2 28 11/23/2023 1204   GLUCOSE 99 11/23/2023 1204   BUN 10 11/23/2023 1204   BUN 9 12/14/2018 1017   CREATININE 0.66 11/23/2023 1204   CALCIUM  9.3 11/23/2023 1204   PROT 6.9 11/23/2023 1204   PROT 7.0 12/14/2018 1017   ALBUMIN 4.5 11/23/2023 1204   ALBUMIN 4.6 12/14/2018 1017   AST 19 11/23/2023 1204   ALT 16 11/23/2023 1204   ALKPHOS 87 11/23/2023 1204   BILITOT 0.6 11/23/2023 1204   GFRNONAA >60 11/23/2023 1204   GFRAA 92 12/14/2018 1017   Lab Results  Component Value Date   CHOL 292 (H) 12/03/2021   HDL 68.10 12/03/2021   LDLCALC 203 (H) 12/03/2021   TRIG 107.0 12/03/2021   CHOLHDL 4 12/03/2021   No results found for: HGBA1C Lab Results  Component Value Date   VITAMINB12 635 09/28/2023   Lab Results  Component Value Date   TSH 1.32 09/28/2023      ASSESSMENT AND PLAN 73 y.o. year old  female  has a past medical history of Dyslipidemia, Lymphoma (HCC) (03/2021), and Seizures (HCC) (2007). here with:  Seizures  Continue Lamictal  extended release 300 mg daily Patient's last seizure was in 2006.  We did discuss the possibility of weaning off medication however she  cannot restrict her driving right now.  She will continue on Lamictal . Lamotrigine  level was checked last year and in normal range.  She will be getting blood work through her PCP.  Advised that she can request that they check a Lamictal  level and send it to our office Did advise that if she has any seizure like events she should let us  know Patient agreed with plan- all questions were answered Follow-up in 1 year or sooner if needed.  Patient preferred to do another virtual visit  Meds ordered this encounter  Medications   LamoTRIgine  300 MG TB24 24 hour tablet    Sig: Take 1 tablet (300 mg total) by mouth daily.    Dispense:  90 tablet    Refill:  3    Supervising Provider:   INES ONETHA NOVAK [[id number]]     Duwaine Russell, MSN, NP-C 02/15/2024, 10:50 AM [facility] [address], [address] [place], [place] [address] [phone]  The patient's condition requires frequent monitoring and adjustments in the treatment plan, reflecting the ongoing complexity of care.  This provider is the continuing focal point for all needed services for this condition.

## 2024-02-15 NOTE — Patient Instructions (Addendum)
 Your Plan:  Continue Lamictal  extended release 300 mg daily Have your PCP check a lamotrigine  level and send to our office on your next blood draw Please call if you have any seizure like events.  SEIZURE PRECAUTIONS Per [place]  DMV statutes, patients with seizures are not allowed to drive until they have been seizure-free for six months.    Use caution when using heavy equipment or power tools. Avoid working on ladders or at heights. Take showers instead of baths. Ensure the water temperature is not too high on the home water heater. Do not go swimming alone. Do not lock yourself in a room alone (i.e. bathroom). When caring for infants or small children, sit down when holding, feeding, or changing them to minimize risk of injury to the child in the event you have a seizure. Maintain good sleep hygiene. Avoid alcohol.    If patient has another seizure, call 911 and bring them back to the ED if: A.  The seizure lasts longer than 5 minutes.      B.  The patient doesn't wake shortly after the seizure or has new problems such as difficulty seeing, speaking or moving following the seizure C.  The patient was injured during the seizure D.  The patient has a temperature over 102 F (39C) E.  The patient vomited during the seizure and now is having trouble breathing       Thank you for coming to see us  at [facility]. I hope we have been able to provide you high quality care today.  You may receive a patient satisfaction survey over the next few weeks. We would appreciate your feedback and comments so that we may continue to improve ourselves and the health of our patients.

## 2024-02-24 ENCOUNTER — Encounter (HOSPITAL_BASED_OUTPATIENT_CLINIC_OR_DEPARTMENT_OTHER): Payer: Self-pay | Admitting: Internal Medicine

## 2024-02-28 ENCOUNTER — Ambulatory Visit: Admitting: Physician Assistant

## 2024-02-28 VITALS — BP 120/70 | HR 75 | Temp 97.7°F | Ht 66.0 in | Wt 179.0 lb

## 2024-02-28 DIAGNOSIS — Z0184 Encounter for antibody response examination: Secondary | ICD-10-CM

## 2024-02-28 DIAGNOSIS — Z23 Encounter for immunization: Secondary | ICD-10-CM

## 2024-02-28 DIAGNOSIS — Z111 Encounter for screening for respiratory tuberculosis: Secondary | ICD-10-CM

## 2024-02-28 NOTE — Progress Notes (Signed)
 Katie Mcneil is a 73 y.o. female here for a follow up of a pre-existing problem.  History of Present Illness:   Chief Complaint  Patient presents with   Form Completion    Pt needs form filled out for work, and TB, MMR and Hep B testing.    Discussed the use of AI scribe software for clinical note transcription with the patient, who gave verbal consent to proceed.  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 She recalls receiving the hepatitis B vaccine over five years ago, but lacks documentation.  She has received her flu shot recently. 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    Past Medical History:  Diagnosis Date   Dyslipidemia    Lymphoma (HCC) 03/2021   Seizures (HCC) 2007   2007-last     Social History   Tobacco Use   Smoking status: Never   Smokeless tobacco: Never  Vaping Use   Vaping status: Never Used  Substance Use Topics   Alcohol use: Not Currently    Alcohol/week: 0.0 standard drinks of alcohol   Drug use: No    Past Surgical History:  Procedure Laterality Date   CATARACT EXTRACTION W/ INTRAOCULAR LENS IMPLANT Bilateral 2019   CESAREAN SECTION  1981, 1990   [facility]OSCOPY  2012   [facility]- normal   COLONOSCOPY WITH PROPOFOL   03/31/2023   Gwendlyn Buddy at [facility]   NO PAST SURGERIES     TUBAL LIGATION      Family History  Problem Relation Age of Onset   Neuropathy Mother    Anuerysm Father    Leukemia Sister 38   Alzheimer's disease Sister 29   Dementia Brother    Heart attack Maternal Grandmother    CVA Maternal Grandfather    Dementia Paternal Grandmother    Seizures Daughter    Other Daughter        Psychological disorder   Colon cancer Neg  Hx    Colon polyps Neg Hx    Esophageal cancer Neg Hx    Rectal cancer Neg Hx    Stomach cancer Neg Hx     No Known Allergies  Current Medications:   Current Outpatient Medications:    atorvastatin  (LIPITOR) 40 MG tablet, TAKE 1 TABLET BY MOUTH EVERY DAY, Disp: 90 tablet, Rfl: 0   B Complex-C (VITAMIN B + C COMPLEX) TABS, , Disp: , Rfl:    cetirizine (ZYRTEC) 10 MG tablet, Take 10 mg by mouth daily., Disp: , Rfl:    LamoTRIgine  300 MG TB24 24 hour tablet, Take 1 tablet (300 mg total) by mouth daily., Disp: 90 tablet, Rfl: 3   Omega-3 Fatty Acids (FISH OIL) 1000 MG CAPS, Take 1 capsule by mouth daily in the afternoon., Disp: , Rfl:    Vitamin D , Cholecalciferol, 25 MCG (1000 UT) CAPS, Take 1 capsule by mouth daily in the afternoon., Disp: , Rfl:    Review of Systems:   Negative unless otherwise specified per HPI.  Vitals:   Vitals:   02/28/24 0859  BP: 120/70  Pulse: 75  Temp: 97.7 F (36.5 C)  TempSrc: Temporal  SpO2: 96%  Weight: 179 lb (81.2 kg)  Height: 5' 6 (1.676 m)  Body mass index is 28.89 kg/m.  Physical Exam:   Physical Exam Vitals and nursing note reviewed.  Constitutional:      General: She is not in acute distress.    Appearance: She is well-developed. She is not ill-appearing or toxic-appearing.  Cardiovascular:     Rate and Rhythm: Normal rate and regular rhythm.     Pulses: Normal pulses.     Heart sounds: Normal heart sounds, S1 normal and S2 normal.  Pulmonary:     Effort: Pulmonary effort is normal.     Breath sounds: Normal breath sounds.  Skin:    General: Skin is warm and dry.  Neurological:     Mental Status: She is alert.     GCS: GCS eye subscore is 4. GCS verbal subscore is 5. GCS motor subscore is 6.  Psychiatric:        Speech: Speech normal.        Behavior: Behavior normal. Behavior is cooperative.     Assessment and Plan:   Assessment and Plan Assessment & Plan Encounter for screening for respiratory tuberculosis -  Order blood test for TB.  Immunity status testing Hepatitis B and MMR vaccination status needs confirmation for substitute teacher application. Flu vaccine received. - Order blood test to confirm hepatitis B and MMR immunity.   Received flu shot today  Lucie Buttner, PA-C

## 2024-03-01 ENCOUNTER — Ambulatory Visit: Payer: Self-pay | Admitting: Physician Assistant

## 2024-03-02 LAB — MEASLES/MUMPS/RUBELLA IMMUNITY
Mumps IgG: 140 [AU]/ml
Rubella: 9.06 {index}
Rubeola IgG: >300 [AU]/ml

## 2024-03-02 LAB — QUANTIFERON-TB GOLD PLUS
Mitogen-NIL: 7.23 [IU]/mL
NIL: 0.04 [IU]/mL
QuantiFERON-TB Gold Plus: NEGATIVE
TB1-NIL: 0 [IU]/mL
TB2-NIL: <0 [IU]/mL

## 2024-03-02 LAB — HEPATITIS B SURFACE ANTIBODY,QUALITATIVE: Hep B S Ab: REACTIVE — AB

## 2024-05-22 ENCOUNTER — Ambulatory Visit: Payer: Medicare Other | Admitting: Physician Assistant

## 2024-05-22 ENCOUNTER — Encounter: Payer: Self-pay | Admitting: Physician Assistant

## 2024-05-22 VITALS — BP 110/66 | HR 82 | Temp 97.3°F | Ht 65.0 in | Wt 175.2 lb

## 2024-05-22 DIAGNOSIS — C8298 Follicular lymphoma, unspecified, lymph nodes of multiple sites: Secondary | ICD-10-CM

## 2024-05-22 DIAGNOSIS — M25571 Pain in right ankle and joints of right foot: Secondary | ICD-10-CM

## 2024-05-22 DIAGNOSIS — M8580 Other specified disorders of bone density and structure, unspecified site: Secondary | ICD-10-CM | POA: Diagnosis not present

## 2024-05-22 DIAGNOSIS — E7849 Other hyperlipidemia: Secondary | ICD-10-CM

## 2024-05-22 DIAGNOSIS — R591 Generalized enlarged lymph nodes: Secondary | ICD-10-CM

## 2024-05-22 DIAGNOSIS — Z0000 Encounter for general adult medical examination without abnormal findings: Secondary | ICD-10-CM

## 2024-05-22 LAB — CBC WITH DIFFERENTIAL/PLATELET
Basophils Absolute: 0 K/uL (ref 0.0–0.1)
Basophils Relative: 1 % (ref 0.0–3.0)
Eosinophils Absolute: 0.2 K/uL (ref 0.0–0.7)
Eosinophils Relative: 4.6 % (ref 0.0–5.0)
HCT: 35.7 % — ABNORMAL LOW (ref 36.0–46.0)
Hemoglobin: 11.7 g/dL — ABNORMAL LOW (ref 12.0–15.0)
Lymphocytes Relative: 27.3 % (ref 12.0–46.0)
Lymphs Abs: 1 K/uL (ref 0.7–4.0)
MCHC: 32.9 g/dL (ref 30.0–36.0)
MCV: 85.6 fl (ref 78.0–100.0)
Monocytes Absolute: 0.5 K/uL (ref 0.1–1.0)
Monocytes Relative: 13.5 % — ABNORMAL HIGH (ref 3.0–12.0)
Neutro Abs: 1.9 K/uL (ref 1.4–7.7)
Neutrophils Relative %: 53.6 % (ref 43.0–77.0)
Platelets: 257 K/uL (ref 150.0–400.0)
RBC: 4.17 Mil/uL (ref 3.87–5.11)
RDW: 14.3 % (ref 11.5–15.5)
WBC: 3.6 K/uL — ABNORMAL LOW (ref 4.0–10.5)

## 2024-05-22 LAB — COMPREHENSIVE METABOLIC PANEL WITH GFR
ALT: 14 U/L (ref 0–35)
AST: 21 U/L (ref 0–37)
Albumin: 4.5 g/dL (ref 3.5–5.2)
Alkaline Phosphatase: 77 U/L (ref 39–117)
BUN: 12 mg/dL (ref 6–23)
CO2: 27 meq/L (ref 19–32)
Calcium: 9 mg/dL (ref 8.4–10.5)
Chloride: 107 meq/L (ref 96–112)
Creatinine, Ser: 0.58 mg/dL (ref 0.40–1.20)
GFR: 90.39 mL/min (ref >60.00)
Glucose, Bld: 89 mg/dL (ref 70–99)
Potassium: 4.1 meq/L (ref 3.5–5.1)
Sodium: 142 meq/L (ref 135–145)
Total Bilirubin: 0.8 mg/dL (ref 0.2–1.2)
Total Protein: 6.8 g/dL (ref 6.0–8.3)

## 2024-05-22 LAB — LIPID PANEL
Cholesterol: 153 mg/dL (ref 0–200)
HDL: 58.5 mg/dL (ref >39.00)
LDL Cholesterol: 75 mg/dL (ref 0–99)
NonHDL: 94.39
Total CHOL/HDL Ratio: 3
Triglycerides: 99 mg/dL (ref 0.0–149.0)
VLDL: 19.8 mg/dL (ref 0.0–40.0)

## 2024-05-22 NOTE — Progress Notes (Signed)
 Subjective:    Katie Mcneil is a 73 y.o. female and is here for a follow up of chronic medical issues.  HPI  Health Maintenance Due  Topic Date Due   Bone Density Scan  10/25/2021   Medicare Annual Wellness (AWV)  05/15/2024   Mammogram  05/08/2024    Discussed the use of AI scribe software for clinical note transcription with the patient, who gave verbal consent to proceed.  History of Present Illness   Katie Mcneil is a 73 year old female who presents with swollen lymph nodes and ankle swelling.  She has had swollen lymph nodes on her neck and face for about a month. The swelling seemed to decrease at first but has persisted. She denies recent dental work or ear pain.  She noticed ankle swelling a couple of weeks ago with mild pain when walking. She denies recent ankle injury. She has occasional leg numbness that she associates with low back pain.  She reports chronic abdominal bloating with a tight, uncomfortable feeling and early fullness, without nausea. She feels her abdomen has looked larger than expected for a long time. She recalls prior imaging where lymph nodes were described as smaller.  Her brother's worsening dementia and her daughter's anxiety and depression, along with her daughter's difficulty maintaining employment, cause significant stress, poor sleep, and financial strain. She is trying to eat healthfully but feels she needs to cook more vegetables.       Health Maintenance: Immunizations -- UpToDate  Colonoscopy -- UpToDate; due in 2027 Mammogram -- overdue PAP -- N/A  Bone Density -- overdue  Diet -- overall healthy Exercise -- limited  Sleep habits -- overall doing well  Mood --  no major concerns  UTD with dentist? - yes UTD with eye doctor? - yes  Weight history: Wt Readings from Last 10 Encounters:  05/22/24 175 lb 4 oz (79.5 kg)  02/28/24 179 lb (81.2 kg)  12/05/23 179 lb 6.4 oz (81.4 kg)  11/23/23 179 lb 12.8 oz (81.6 kg)  09/28/23  181 lb (82.1 kg)  08/24/23 178 lb 6.4 oz (80.9 kg)  05/25/23 174 lb 12.8 oz (79.3 kg)  05/19/23 174 lb 12.8 oz (79.3 kg)  05/16/23 175 lb (79.4 kg)  03/31/23 175 lb (79.4 kg)   Body mass index is 29.16 kg/m. No LMP recorded. Patient is postmenopausal.  Alcohol use:  reports that she does not currently use alcohol.  Tobacco use:  Tobacco Use: Low Risk  (05/22/2024)   Patient History    Smoking Tobacco Use: Never    Smokeless Tobacco Use: Never    Passive Exposure: Not on file   Eligible for lung cancer screening? no     05/19/2023   10:48 AM  Depression screen PHQ 2/9  Decreased Interest 0  Down, Depressed, Hopeless 0  PHQ - 2 Score 0  Altered sleeping 0  Tired, decreased energy 0  Change in appetite 0  Feeling bad or failure about yourself  0  Trouble concentrating 0  Moving slowly or fidgety/restless 0  Suicidal thoughts 0  PHQ-9 Score 0   Difficult doing work/chores Not difficult at all     Data saved with a previous flowsheet row definition     Other providers/specialists: Patient Care Team: Job Lukes, [name] as PCP - General (Physician Assistant)    PMHx, SurgHx, SocialHx, Medications, and Allergies were reviewed in the Visit Navigator and updated as appropriate.   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   Seizures (HCC) 2007   2007-last     Past Surgical History:  Procedure Laterality Date   CATARACT EXTRACTION W/ INTRAOCULAR LENS IMPLANT Bilateral 2019   CESAREAN SECTION  1981, 1990   [facility]OSCOPY  2012   [facility]- [facility]   COLONOSCOPY WITH PROPOFOL   03/31/2023   [facility]   NO PAST SURGERIES     TUBAL LIGATION       Family History  Problem Relation Age of Onset   Neuropathy Mother    Anuerysm Father    Leukemia Sister 40   Alzheimer's disease Sister 65   Dementia Brother    Heart attack Maternal Grandmother    CVA Maternal Grandfather    Dementia Paternal Grandmother    Seizures  Daughter    Other Daughter        Psychological disorder   Colon cancer Neg Hx    Colon polyps Neg Hx    Esophageal cancer Neg Hx    Rectal cancer Neg Hx    Stomach cancer Neg Hx     Social History   Tobacco Use   Smoking status: Never   Smokeless tobacco: Never  Vaping Use   Vaping status: Never Used  Substance Use Topics   Alcohol use: Not Currently    Alcohol/week: 0.0 standard drinks of alcohol   Drug use: No    Review of Systems:   Review of Systems  Constitutional:  Negative for chills, fever, malaise/fatigue and weight loss.  HENT:  Negative for hearing loss, sinus pain and sore throat.   Respiratory:  Negative for cough and hemoptysis.   Cardiovascular:  Negative for chest pain, palpitations, leg swelling and PND.  Gastrointestinal:  Negative for abdominal pain, constipation, diarrhea, heartburn, nausea and vomiting.  Genitourinary:  Negative for dysuria, frequency and urgency.  Musculoskeletal:  Negative for back pain, myalgias and neck pain.  Skin:  Negative for itching and rash.  Neurological:  Negative for dizziness, tingling, seizures and headaches.  Endo/Heme/Allergies:  Negative for polydipsia.  Psychiatric/Behavioral:  Negative for depression. The patient is not nervous/anxious.     Objective:   BP 110/66 (BP Location: Left Arm, Patient Position: Sitting, Cuff Size: Large)   Pulse 82   Temp (!) 97.3 F (36.3 C) (Temporal)   Ht 5' 5 (1.651 m)   Wt 175 lb 4 oz (79.5 kg)   SpO2 98%   BMI 29.16 kg/m  Body mass index is 29.16 kg/m.   General Appearance:    Alert, cooperative, no distress, appears stated age  Head:    Normocephalic, without obvious abnormality, atraumatic  Eyes:    PERRL, conjunctiva/corneas clear, EOM's intact, fundi    benign, both eyes  Ears:    Normal TM's and external ear canals, both ears  Nose:   Nares normal, septum midline, mucosa normal, no drainage    or sinus tenderness  Throat:   Lips, mucosa, and tongue normal;  teeth and gums normal  Neck:   Supple, symmetrical, trachea midline, no adenopathy;    thyroid :  no enlargement/tenderness/nodules; no carotid   bruit or JVD  Back:     Symmetric, no curvature, ROM normal, no CVA tenderness  Lungs:     Clear to auscultation bilaterally, respirations unlabored  Chest Wall:    No tenderness or deformity   Heart:    Regular rate and rhythm, S1 and S2 normal, no murmur, rub or gallop  Breast Exam:    Deferred  Abdomen:  Soft, non-tender, bowel sounds active all four quadrants,    no masses, no organomegaly  Genitalia:    Deferred   Extremities:   Extremities normal, atraumatic, no cyanosis or edema  Pulses:   2+ and symmetric all extremities  Skin:   Skin color, texture, turgor normal, no rashes or lesions Slight swelling to R lateral malleolus  Lymph nodes:   Cervical, supraclavicular, and axillary nodes normal Prominent R preauricular lymph node  Neurologic:   CNII-XII intact, normal strength, sensation and reflexes    throughout    Assessment/Plan:   Assessment and Plan    General Health Maintenance Mammogram not completed this year. Bone density scan overdue. Flu vaccine received. - Encouraged completion of mammogram. - Ensured colonoscopy is scheduled for 2027. - Confirmed flu vaccine received.   Generalized lymphadenopathy Swollen lymph nodes in neck and face for a month.  - Messaged Dr. Onesimo regarding lymphadenopathy. - Encouraged her to call Dr. Burnard for follow-up.  Ankle pain and swelling Swelling and pain in ankle for weeks, likely strain or inflammation. Differential includes joint fluid accumulation. - Referred to orthopedic sports medicine for evaluation and possible ultrasound. - Consider x-ray and ultrasound at bedside for immediate assessment.  Osteopenia Overdue for bone density scan. Last scan in 2020. - She plans to update DEXA with gynecology   Hyperlipidemia Cholesterol panel not checked recently. Due for annual  panel. - Ordered cholesterol panel.  Abbye Lao PA-C

## 2024-05-22 NOTE — Patient Instructions (Signed)
 It was great to see you!  Please try to get your mammogram and bone density scan  Call Dr Onesimo about your lymph node  A referral has been placed for you to see one of our fantastic providers at [facility]. Someone from their office will be in touch soon regarding scheduling your appointment.  Their location:  [facility] at [facility]  [address] on the 1st floor Phone number [phone] Fax [phone].   This location is across the street from the entrance to [employer] and in the same complex as the [facility]  Let's follow-up in 1 year, sooner if you have concerns.  Take care,  Lucie Buttner PA-C

## 2024-05-23 ENCOUNTER — Ambulatory Visit: Payer: Medicare Other

## 2024-05-23 ENCOUNTER — Ambulatory Visit: Payer: Self-pay | Admitting: Physician Assistant

## 2024-05-23 VITALS — BP 110/66 | Ht 65.0 in | Wt 175.0 lb

## 2024-05-23 DIAGNOSIS — Z1231 Encounter for screening mammogram for malignant neoplasm of breast: Secondary | ICD-10-CM

## 2024-05-23 DIAGNOSIS — E2839 Other primary ovarian failure: Secondary | ICD-10-CM

## 2024-05-23 DIAGNOSIS — Z0000 Encounter for general adult medical examination without abnormal findings: Secondary | ICD-10-CM | POA: Diagnosis not present

## 2024-05-23 NOTE — Progress Notes (Signed)
 Chief Complaint  Patient presents with   Medicare Wellness     Subjective:   Katie Mcneil is a 73 y.o. female who presents for a Medicare Annual Wellness Visit.  Visit info / Clinical Intake: Medicare Wellness Visit Type:: Subsequent Annual Wellness Visit Persons participating in visit and providing information:: patient Medicare Wellness Visit Mode:: Telephone If telephone:: video declined Since this visit was completed virtually, some vitals may be partially provided or unavailable. Missing vitals are due to the limitations of the virtual format.: Documented vitals are patient reported If Telephone or Video please confirm:: I connected with patient using audio/video enable telemedicine. I verified patient identity with two identifiers, discussed telehealth limitations, and patient agreed to proceed. Patient Location:: Home Provider Location:: Home office Interpreter Needed?: No Pre-visit prep was completed: yes AWV questionnaire completed by patient prior to visit?: yes Date:: 05/21/24 Living arrangements:: (Patient-Rptd) with family/others Patient's Overall Health Status Rating: (Patient-Rptd) good Typical amount of pain: (Patient-Rptd) none Does pain affect daily life?: (Patient-Rptd) no Are you currently prescribed opioids?: no  Dietary Habits and Nutritional Risks How many meals a day?: (Patient-Rptd) 3 Eats fruit and vegetables daily?: (Patient-Rptd) yes Most meals are obtained by: (Patient-Rptd) preparing own meals In the last 2 weeks, have you had any of the following?: none Diabetic:: no  Functional Status Activities of Daily Living (to include ambulation/medication): (Patient-Rptd) Independent Ambulation: Independent with device- listed below Home Assistive Devices/Equipment: Eyeglasses Medication Administration: (Patient-Rptd) Independent Home Management (perform basic housework or laundry): (Patient-Rptd) Independent Manage your own finances?: (Patient-Rptd)  yes Primary transportation is: (Patient-Rptd) driving Concerns about vision?: no *vision screening is required for [facility]* Concerns about hearing?: no  Fall Screening Falls in the past year?: 0 Number of falls in past year: 0 Was there an injury with Fall?: 0 Fall Risk Category Calculator: 0 Patient Fall Risk Level: Low Fall Risk  Fall Risk Patient at Risk for Falls Due to: No Fall Risks Fall risk Follow up: Falls evaluation completed  Home and Transportation Safety: All rugs have non-skid backing?: (Patient-Rptd) N/A, no rugs All stairs or steps have railings?: (Patient-Rptd) yes Grab bars in the bathtub or shower?: (!) (Patient-Rptd) no Have non-skid surface in bathtub or shower?: (!) (Patient-Rptd) no Good home lighting?: (Patient-Rptd) yes Regular seat belt use?: (Patient-Rptd) yes Hospital stays in the last year:: (Patient-Rptd) no  Cognitive Assessment Difficulty concentrating, remembering, or making decisions? : (Patient-Rptd) no Will 6CIT or Mini Cog be Completed: yes What year is it?: 0 points What month is it?: 0 points Give patient an address phrase to remember (5 components): [address] [place]  About what time is it?: 0 points Count backwards from 20 to 1: 0 points Say the months of the year in reverse: 0 points Repeat the address phrase from earlier: 0 points 6 CIT Score: 0 points  Advance Directives (For Healthcare) Does Patient Have a Medical Advance Directive?: Yes Type of Advance Directive: Healthcare Power of Attorney Copy of Healthcare Power of Attorney in Chart?: No - copy requested  Reviewed/Updated  Reviewed/Updated: Reviewed All (Medical, Surgical, Family, Medications, Allergies, Care Teams, Patient Goals)    Allergies (verified) Patient has no known allergies.   Current Medications (verified) Outpatient Encounter Medications as of 05/23/2024  Medication Sig   atorvastatin  (LIPITOR) 40 MG tablet TAKE 1 TABLET BY MOUTH EVERY DAY   B  Complex-C (VITAMIN B + C COMPLEX) TABS    cetirizine (ZYRTEC) 10 MG tablet Take 10 mg by mouth daily.   LamoTRIgine  300  MG TB24 24 hour tablet Take 1 tablet (300 mg total) by mouth daily.   Omega-3 Fatty Acids (FISH OIL) 1000 MG CAPS Take 1 capsule by mouth daily in the afternoon.   Vitamin D , Cholecalciferol, 25 MCG (1000 UT) CAPS Take 1 capsule by mouth daily in the afternoon.   No facility-administered encounter medications on file as of 05/23/2024.    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   Seizures (HCC) 2007   2007-last   Past Surgical History:  Procedure Laterality Date   CATARACT EXTRACTION W/ INTRAOCULAR LENS IMPLANT Bilateral 2019   CESAREAN SECTION  1981, 1990   [facility]COPY  2012   [facility]- [facility]   COLONOSCOPY WITH PROPOFOL   03/31/2023   [facility]   NO PAST SURGERIES     TUBAL LIGATION     Family History  Problem Relation Age of Onset   Neuropathy Mother    Anuerysm Father    Leukemia Sister 10   Alzheimer's disease Sister 41   Dementia Brother    Heart attack Maternal Grandmother    CVA Maternal Grandfather    Dementia Paternal Grandmother    Seizures Daughter    Other Daughter        Psychological disorder   Colon cancer Neg Hx    Colon polyps Neg Hx    Esophageal cancer Neg Hx    Rectal cancer Neg Hx    Stomach cancer Neg Hx    Social History   Occupational History   Not on file  Tobacco Use   Smoking status: Never   Smokeless tobacco: Never  Vaping Use   Vaping status: Never Used  Substance and Sexual Activity   Alcohol use: Not Currently    Alcohol/week: 0.0 standard drinks of alcohol   Drug use: No   Sexual activity: Not Currently    Birth control/protection: Post-menopausal   Tobacco Counseling Counseling given: Not Answered  SDOH Screenings   Food Insecurity: No Food Insecurity (05/21/2024)  Housing: Low Risk  (05/21/2024)  Transportation Needs: No Transportation Needs  (05/21/2024)  Utilities: Not At Risk (05/23/2024)  Depression (PHQ2-9): Low Risk  (05/23/2024)  Financial Resource Strain: Low Risk  (05/21/2024)  Physical Activity: Insufficiently Active (05/21/2024)  Social Connections: Moderately Integrated (05/21/2024)  Stress: Stress Concern Present (05/21/2024)  Tobacco Use: Low Risk  (05/23/2024)  Health Literacy: Adequate Health Literacy (05/23/2024)   See flowsheets for full screening details  Depression Screen PHQ 2 & 9 Depression Scale- Over the past 2 weeks, how often have you been bothered by any of the following problems? Little interest or pleasure in doing things: 0 Feeling down, depressed, or hopeless (PHQ Adolescent also includes...irritable): 0 PHQ-2 Total Score: 0     Goals Addressed               This Visit's Progress     lose weight (pt-stated)        Lose weight 10 lbs             Objective:    Today's Vitals   05/23/24 1403  BP: 110/66  Weight: 175 lb (79.4 kg)  Height: 5' 5 (1.651 m)   Body mass index is 29.12 kg/m.  Hearing/Vision screen Hearing Screening - Comments:: Pt denies any hearing issues  Vision Screening - Comments:: Wears rx glasses - up to date with routine eye exams with Dr Thom Hamilton Immunizations and Health Maintenance Health Maintenance  Topic Date  Due   Bone Density Scan  10/25/2021   Mammogram  05/08/2024   Zoster Vaccines- Shingrix (1 of 2) 09/27/2024 (Originally 12/11/1969)   Medicare Annual Wellness (AWV)  05/23/2025   Colonoscopy  03/30/2026   DTaP/Tdap/Td (3 - Td or Tdap) 09/11/2030   Pneumococcal Vaccine: 50+ Years  Completed   Influenza Vaccine  Completed   Hepatitis C Screening  Completed   Meningococcal B Vaccine  Aged Out   COVID-19 Vaccine  Discontinued        Assessment/Plan:  This is a routine wellness examination for Katie Mcneil.  Patient Care Team: Job Lukes, [name] as PCP - General (Physician Assistant) Mat Browning, MD as Consulting Physician (Obstetrics  and Gynecology)  I have personally reviewed and noted the following in the patients chart:   Medical and social history Use of alcohol, tobacco or illicit drugs  Current medications and supplements including opioid prescriptions. Functional ability and status Nutritional status Physical activity Advanced directives List of other physicians Hospitalizations, surgeries, and ER visits in previous 12 months Vitals Screenings to include cognitive, depression, and falls Referrals and appointments  Orders Placed This Encounter  Procedures   DG Bone Density    Standing Status:   Future    Expiration Date:   08/21/2024    Reason for Exam (SYMPTOM  OR DIAGNOSIS REQUIRED):   Estrogen deficiency    Preferred imaging location?:   External   MM Digital Screening Bilateral    Standing Status:   Future    Expiration Date:   08/21/2024    Reason for Exam (SYMPTOM  OR DIAGNOSIS REQUIRED):   Breast Cancer Screening    Preferred imaging location?:   External   In addition, I have reviewed and discussed with patient certain preventive protocols, quality metrics, and best practice recommendations. A written personalized care plan for preventive services as well as general preventive health recommendations were provided to patient.   Ellouise VEAR Haws, LPN   [id number]/[id number]   Return in about 1 year (around 05/27/2025).  After Visit Summary: (MyChart) Due to this being a telephonic visit, the after visit summary with patients personalized plan was offered to patient via MyChart   Nurse Notes: No voiced or noted concerns at this time

## 2024-05-23 NOTE — Patient Instructions (Signed)
 Katie Mcneil,  Thank you for taking the time for your Medicare Wellness Visit. I appreciate your continued commitment to your health goals. Please review the care plan we discussed, and feel free to reach out if I can assist you further.  Please note that Annual Wellness Visits do not include a physical exam. Some assessments may be limited, especially if the visit was conducted virtually. If needed, we may recommend an in-person follow-up with your provider.  Ongoing Care Seeing your primary care provider every 3 to 6 months helps us  monitor your health and provide consistent, personalized care.   Referrals If a referral was made during today's visit and you haven't received any updates within two weeks, please contact the referred provider directly to check on the status.  Recommended Screenings:  Health Maintenance  Topic Date Due   Osteoporosis screening with Bone Density Scan  10/25/2021   Breast Cancer Screening  05/08/2024   Zoster (Shingles) Vaccine (1 of 2) 09/27/2024*   Medicare Annual Wellness Visit  05/23/2025   Colon Cancer Screening  03/30/2026   DTaP/Tdap/Td vaccine (3 - Td or Tdap) 09/11/2030   Pneumococcal Vaccine for age over 34  Completed   Flu Shot  Completed   Hepatitis C Screening  Completed   Meningitis B Vaccine  Aged Out   COVID-19 Vaccine  Discontinued  *Topic was postponed. The date shown is not the original due date.       05/21/2024    9:54 PM  Advanced Directives  Does Patient Have a Medical Advance Directive? Yes  Type of Advance Directive Healthcare Power of Attorney  Copy of Healthcare Power of Attorney in Chart? No - copy requested    Vision: Annual vision screenings are recommended for early detection of glaucoma, cataracts, and diabetic retinopathy. These exams can also reveal signs of chronic conditions such as diabetes and high blood pressure.  Dental: Annual dental screenings help detect early signs of oral cancer, gum disease, and other  conditions linked to overall health, including heart disease and diabetes.  Please see the attached documents for additional preventive care recommendations.

## 2024-05-30 NOTE — Progress Notes (Unsigned)
 [facility] [address] [place] [address] Phone: [phone]   Assessment and Plan:     1. Acute right ankle pain (Primary) 2. Peroneal tendinitis, right -Acute, initial sports medicine visit - Most consistent with peroneal tendinitis likely flared by increased physical activity - Start Celebrex  200 mg twice daily x2 weeks.  If still having pain after 2 weeks, complete 3rd-week of NSAID. May use remaining NSAID as needed once daily for pain control.  Do not to use additional over-the-counter NSAIDs (ibuprofen, naproxen, Advil, Aleve, etc.) while taking prescription NSAIDs.  May use Tylenol  [phone] mg 2 to 3 times a day for breakthrough pain.  If patient cannot tolerate Celebrex  due to GI symptoms, could discontinue NSAIDs and prescribe prednisone course - Start HEP for ankle - Recommend wearing comfortable and supportive footwear --ray obtained in clinic.  My interpretation: No acute fracture or dislocation.  Pes planus.  Degenerative changes in medial and lateral ankle  15 additional minutes spent for educating Therapeutic Home Exercise Program.  This included exercises focusing on stretching, strengthening, with focus on eccentric aspects.   Long term goals include an improvement in range of motion, strength, endurance as well as avoiding reinjury. Patient's frequency would include in 1-2 times a day, 3-5 times a week for a duration of 6-12 weeks. Proper technique shown and discussed handout in great detail with ATC.  All questions were discussed and answered.    Pertinent previous records reviewed include none   Follow Up: 4 to 6 weeks for reevaluation.  Could discuss prednisone course versus physical therapy versus CSI versus lace up ankle brace   Subjective:   I, Katie Mcneil, am serving as a neurosurgeon for Katie Mcneil  Chief Complaint: right ankle pain   HPI:   05/31/2024 Patient is a 73 year old female  with right ankle pain. Patient states pain started 3 weeks ago. Pain is intermittent when she walks. Note she has some swelling. No MOI. Pain radiates up the leg. Decreased ROM. No numbness or tingling. No meds for the pain .    Relevant Historical Information: Epilepsy, osteopenia, follicular lymphoma  Additional pertinent review of systems negative.  Current Medications[1]   Objective:     Vitals:   05/31/24 1102  Pulse: 71  SpO2: 100%  Weight: 174 lb (78.9 kg)  Height: 5' 5 (1.651 m)      Body mass index is 28.96 kg/m.    Physical Exam:    Gen: Appears well, nad, nontoxic and pleasant Psych: Alert and oriented, appropriate mood and affect Neuro: sensation intact, strength is 5/5 with df/pf/inv/ev, muscle tone wnl Skin: no susupicious lesions or rashes  Right ankle:  No deformity, no swelling or effusion TTP posterior to lateral malleolus, ATFL, CFL NTTP over fibular head, lat mal, medial mal, achilles, navicular, base of 5th, deltoid, calcaneous or midfoot ROM DF 30, PF 45, inv/ev intact Negative ant drawer, talar tilt, rotation test, squeeze test. Neg thompson No pain with resisted inversion or eversion    Electronically signed by:  Katie [facility]neil [facility] 11:18 AM 05/31/2024     [1]  Current Outpatient Medications:    celecoxib  (CELEBREX ) 200 MG capsule, Take 1 capsule (200 mg total) by mouth 2 (two) times daily., Disp: 60 capsule, Rfl: 0   atorvastatin  (LIPITOR) 40 MG tablet, TAKE 1 TABLET BY MOUTH EVERY DAY, Disp: 90 tablet, Rfl: 0   B Complex-C (VITAMIN B + C  COMPLEX) TABS, , Disp: , Rfl:    cetirizine (ZYRTEC) 10 MG tablet, Take 10 mg by mouth daily., Disp: , Rfl:    LamoTRIgine  300 MG TB24 24 hour tablet, Take 1 tablet (300 mg total) by mouth daily., Disp: 90 tablet, Rfl: 3   Omega-3 Fatty Acids (FISH OIL) 1000 MG CAPS, Take 1 capsule by mouth daily in the afternoon., Disp: , Rfl:    Vitamin D , Cholecalciferol, 25 MCG (1000 UT)  CAPS, Take 1 capsule by mouth daily in the afternoon., Disp: , Rfl:

## 2024-05-31 ENCOUNTER — Ambulatory Visit

## 2024-05-31 ENCOUNTER — Ambulatory Visit: Admitting: Sports Medicine

## 2024-05-31 VITALS — HR 71 | Ht 65.0 in | Wt 174.0 lb

## 2024-05-31 DIAGNOSIS — M25571 Pain in right ankle and joints of right foot: Secondary | ICD-10-CM | POA: Diagnosis not present

## 2024-05-31 DIAGNOSIS — M7671 Peroneal tendinitis, right leg: Secondary | ICD-10-CM | POA: Diagnosis not present

## 2024-05-31 MED ORDER — CELECOXIB 200 MG PO CAPS: 200.0000 mg | ORAL_CAPSULE | Freq: Two times a day (BID) | ORAL | 0 refills | Status: AC

## 2024-05-31 NOTE — Patient Instructions (Signed)
 Ankle HEP   - Start Celebrex  200  mg  2x daily  x2 weeks.  If still having pain after 2 weeks, complete 3rd-week of NSAID. May use remaining NSAID as needed once daily for pain control.  Do not to use additional over-the-counter NSAIDs (ibuprofen, naproxen, Advil, Aleve, etc.) while taking prescription NSAIDs.  May use Tylenol  [phone] mg 2 to 3 times a day for breakthrough pain.  4 week follow up

## 2024-06-12 ENCOUNTER — Ambulatory Visit (INDEPENDENT_AMBULATORY_CARE_PROVIDER_SITE_OTHER)

## 2024-06-12 VITALS — BP 120/75 | HR 71 | Temp 98.0°F | Resp 18 | Ht 66.0 in | Wt 178.6 lb

## 2024-06-12 DIAGNOSIS — E7849 Other hyperlipidemia: Secondary | ICD-10-CM | POA: Diagnosis not present

## 2024-06-12 MED ORDER — INCLISIRAN SODIUM 284 MG/1.5ML ~~LOC~~ SOSY
284.0000 mg | PREFILLED_SYRINGE | Freq: Once | SUBCUTANEOUS | Status: AC
  Administered 2024-06-12: 284 mg via SUBCUTANEOUS
  Filled 2024-06-12: qty 1.5

## 2024-06-12 NOTE — Progress Notes (Signed)
 Diagnosis: Hyperlipidemia  Provider:  Chilton Greathouse MD  Procedure: Injection  Leqvio (inclisiran), Dose: 284 mg, Site: subcutaneous, Number of injections: 1  Injection Site(s): Left arm  Post Care:  left arm injection  Discharge: Condition: Good, Destination: Home . AVS Declined  Performed by:  Rico Ala, LPN

## 2024-06-18 ENCOUNTER — Ambulatory Visit: Payer: Self-pay | Admitting: Sports Medicine

## 2024-06-19 ENCOUNTER — Telehealth: Payer: Self-pay

## 2024-06-19 NOTE — Telephone Encounter (Signed)
 Auth Submission: NO AUTH NEEDED Site of care: Site of care: [facility] Payer: Medicare A/B with AARP supplement Medication & CPT/J Code(s) submitted: Leqvio  (Inclisiran) J1306 Diagnosis Code:  Route of submission (phone, fax, portal):  Phone # Fax # Auth type: Buy/Bill PB Units/visits requested: 284mg  x 2 doses Reference number:  Approval from: 06/19/24 to 07/14/25

## 2024-06-27 NOTE — Progress Notes (Signed)
 "               Odis Mace D.[facility]MENTEEN [facility] [address] [place] [address] Phone: [phone]   Assessment and Plan:     1. Chronic right-sided low back pain with right-sided sciatica (Primary) 2. Numbness and tingling of right lower extremity -Chronic with exacerbation, subsequent visit - No change in patient's right ankle discomfort.  After further evaluation, symptoms are more consistent with low back pain with lumbar radiculopathy.  The symptoms are chronic and intermittent in nature with a recent flare over the past 2 months -No significant change in symptoms with Celebrex  course.  Use Celebrex  200 mg daily as needed for breakthrough pain.  Recommend limiting chronic NSAIDs to 1-2 doses per week to prevent long-term side effects. Use Tylenol  500 to 1000 mg tablets 2-3 times a day as needed for day-to-day pain relief. - Offered prednisone course which patient declined at today's visit.  If pain worsens, patient can call and asked for prescription - Continue HEP and activity as tolerated - X-ray of lumbar spine obtained at today's visit - Recommend further evaluation with lumbar MRI due to failure to improve despite >6 weeks of conservative therapy, pain with day-to-day activities, pain >6/10    Pertinent previous records reviewed include none   Follow Up: 1 week after MRI to review results and discuss treatment plan.  Could consider epidural CSI if appropriate based on imaging   Subjective:   I, Moenique Parris, am serving as a neurosurgeon for Doctor [employer]   Chief Complaint: right ankle pain    HPI:    05/31/2024 Patient is a 74 year old female with right ankle pain. Patient states pain started 3 weeks ago. Pain is intermittent when she walks. Note she has some swelling. No MOI. Pain radiates up the leg. Decreased ROM. No numbness or tingling. No meds for the pain .    06/28/2024 Patient states she is the same.   Relevant Historical  Information: Epilepsy, osteopenia, follicular lymphoma  Additional pertinent review of systems negative.  Current Medications[1]   Objective:     Vitals:   06/28/24 1036  Pulse: 76  SpO2: 90%  Weight: 178 lb (80.7 kg)  Height: 5' 6 (1.676 m)      Body mass index is 28.73 kg/m.    Physical Exam:    Gen: Appears well, nad, nontoxic and pleasant Psych: Alert and oriented, appropriate mood and affect Neuro: Decreased sensation over right lateral leg distal to knee.  Sensation intact, strength is 5/5 in upper and lower extremities, muscle tone wnl Skin: no susupicious lesions or rashes  Back - Normal skin, Spine with normal alignment and no deformity.   No tenderness to vertebral process palpation.   Right lumbar paraspinous muscles are   tender and without spasm  TTP gluteal musculature Straight leg raise positive right Trendelenberg negative Piriformis Test negative Gait normal    Electronically signed by:  Odis Mace D.[facility] 10:59 AM 06/28/24     [1]  Current Outpatient Medications:    atorvastatin  (LIPITOR) 40 MG tablet, TAKE 1 TABLET BY MOUTH EVERY DAY, Disp: 90 tablet, Rfl: 0   B Complex-C (VITAMIN B + C COMPLEX) TABS, , Disp: , Rfl:    celecoxib  (CELEBREX ) 200 MG capsule, Take 1 capsule (200 mg total) by mouth 2 (two) times daily., Disp: 60 capsule, Rfl: 0   cetirizine (ZYRTEC) 10 MG tablet, Take 10 mg by mouth  daily., Disp: , Rfl:    LamoTRIgine  300 MG TB24 24 hour tablet, Take 1 tablet (300 mg total) by mouth daily., Disp: 90 tablet, Rfl: 3   Omega-3 Fatty Acids (FISH OIL) 1000 MG CAPS, Take 1 capsule by mouth daily in the afternoon., Disp: , Rfl:    Vitamin D , Cholecalciferol, 25 MCG (1000 UT) CAPS, Take 1 capsule by mouth daily in the afternoon., Disp: , Rfl:   "

## 2024-06-28 ENCOUNTER — Ambulatory Visit

## 2024-06-28 ENCOUNTER — Ambulatory Visit: Admitting: Sports Medicine

## 2024-06-28 VITALS — HR 76 | Ht 66.0 in | Wt 178.0 lb

## 2024-06-28 DIAGNOSIS — G8929 Other chronic pain: Secondary | ICD-10-CM | POA: Diagnosis not present

## 2024-06-28 DIAGNOSIS — R200 Anesthesia of skin: Secondary | ICD-10-CM | POA: Diagnosis not present

## 2024-06-28 DIAGNOSIS — R202 Paresthesia of skin: Secondary | ICD-10-CM

## 2024-06-28 DIAGNOSIS — M5441 Lumbago with sciatica, right side: Secondary | ICD-10-CM

## 2024-06-28 NOTE — Patient Instructions (Signed)
 Xrays on the way out   MRI lumbar   Follow up 1 week after MRI to discuss results  Call and ask for a prednisone course if you would like

## 2024-06-29 ENCOUNTER — Telehealth: Payer: Self-pay | Admitting: Sports Medicine

## 2024-06-29 ENCOUNTER — Other Ambulatory Visit: Payer: Self-pay | Admitting: Sports Medicine

## 2024-06-29 MED ORDER — METHYLPREDNISOLONE 4 MG PO TBPK: ORAL_TABLET | ORAL | 0 refills | Status: DC

## 2024-06-29 NOTE — Telephone Encounter (Signed)
 Patient called stating that she would like for Dr Leonce to send in Prednisone for her as discussed yesterday.  [facility] [place]

## 2024-06-29 NOTE — Progress Notes (Signed)
 Prednisone  dos pak placed

## 2024-06-29 NOTE — Telephone Encounter (Signed)
 Prednisone  dos pak placed

## 2024-07-03 ENCOUNTER — Other Ambulatory Visit: Payer: Self-pay

## 2024-07-03 ENCOUNTER — Ambulatory Visit: Payer: Self-pay | Admitting: Sports Medicine

## 2024-07-03 DIAGNOSIS — C8298 Follicular lymphoma, unspecified, lymph nodes of multiple sites: Secondary | ICD-10-CM

## 2024-07-04 ENCOUNTER — Inpatient Hospital Stay (HOSPITAL_BASED_OUTPATIENT_CLINIC_OR_DEPARTMENT_OTHER): Admitting: Hematology

## 2024-07-04 ENCOUNTER — Inpatient Hospital Stay: Attending: Hematology

## 2024-07-04 VITALS — BP 136/66 | HR 67 | Temp 97.3°F | Resp 20 | Wt 176.3 lb

## 2024-07-04 DIAGNOSIS — C8298 Follicular lymphoma, unspecified, lymph nodes of multiple sites: Secondary | ICD-10-CM

## 2024-07-04 LAB — IRON AND IRON BINDING CAPACITY (CC-WL,HP ONLY)
Iron: 52 ug/dL (ref 28–170)
Saturation Ratios: 19 % (ref 10.4–31.8)
TIBC: 269 ug/dL (ref 250–450)
UIBC: 217 ug/dL

## 2024-07-04 LAB — CBC WITH DIFFERENTIAL (CANCER CENTER ONLY)
Abs Immature Granulocytes: 0.02 K/uL (ref 0.00–0.07)
Basophils Absolute: 0.1 K/uL (ref 0.0–0.1)
Basophils Relative: 1 %
Eosinophils Absolute: 0.1 K/uL (ref 0.0–0.5)
Eosinophils Relative: 2 %
HCT: 36.8 % (ref 36.0–46.0)
Hemoglobin: 11.9 g/dL — ABNORMAL LOW (ref 12.0–15.0)
Immature Granulocytes: 0 %
Lymphocytes Relative: 34 %
Lymphs Abs: 2.2 K/uL (ref 0.7–4.0)
MCH: 27.7 pg (ref 26.0–34.0)
MCHC: 32.3 g/dL (ref 30.0–36.0)
MCV: 85.6 fL (ref 80.0–100.0)
Monocytes Absolute: 0.7 K/uL (ref 0.1–1.0)
Monocytes Relative: 11 %
Neutro Abs: 3.3 K/uL (ref 1.7–7.7)
Neutrophils Relative %: 52 %
Platelet Count: 281 K/uL (ref 150–400)
RBC: 4.3 MIL/uL (ref 3.87–5.11)
RDW: 14.1 % (ref 11.5–15.5)
WBC Count: 6.4 K/uL (ref 4.0–10.5)
nRBC: 0 % (ref 0.0–0.2)

## 2024-07-04 LAB — CMP (CANCER CENTER ONLY)
ALT: 21 U/L (ref 0–44)
AST: 22 U/L (ref 15–41)
Albumin: 4.6 g/dL (ref 3.5–5.0)
Alkaline Phosphatase: 85 U/L (ref 38–126)
Anion gap: 12 (ref 5–15)
BUN: 15 mg/dL (ref 8–23)
CO2: 25 mmol/L (ref 22–32)
Calcium: 9.2 mg/dL (ref 8.9–10.3)
Chloride: 105 mmol/L (ref 98–111)
Creatinine: 0.66 mg/dL (ref 0.44–1.00)
GFR, Estimated: >60 mL/min
Glucose, Bld: 95 mg/dL (ref 70–99)
Potassium: 4.2 mmol/L (ref 3.5–5.1)
Sodium: 141 mmol/L (ref 135–145)
Total Bilirubin: 0.4 mg/dL (ref 0.0–1.2)
Total Protein: 7.2 g/dL (ref 6.5–8.1)

## 2024-07-04 LAB — FERRITIN: Ferritin: 58 ng/mL (ref 11–307)

## 2024-07-04 LAB — LACTATE DEHYDROGENASE: LDH: 153 U/L (ref 105–235)

## 2024-07-04 NOTE — Progress Notes (Signed)
 " HEMATOLOGY ONCOLOGY PROGRESS NOTE  Date of service: 07/04/2024  Patient Care Team: Job Lukes, [name] as PCP - General (Physician Assistant) Mat Browning, MD as Consulting Physician (Obstetrics and Gynecology)  CHIEF COMPLAINT/PURPOSE OF CONSULTATION: Follow-up for continued evaluation and management of Follicular Lymphoma  HISTORY OF PRESENTING ILLNESS: Plz see previous notes for details of initial presentation.  INTERVAL HISTORY: Katie Mcneil is a 74 y.o. female who is here today for continued evaluation and management of follicular lymphoma. she was last seen by me on 11/23/2023; at the time she mentioned experiencing fatigue, otherwise doing well.   Today, she says that she has been doing well. She was recently placed on a medrol  tapering dose.  She notes the appearance of a new lymph node in the right side of her neck. She is reportedly UTD with all of her cancer screenings per guidelines.   She denies taking an acid suppressant.   Denies any new infection issues, fevers/chills, drenching night sweats, abdominal pain/distention, new lumps/bumps, bowel/urinary changes.  REVIEW OF SYSTEMS:   10 Point review of systems of done and is negative except as noted above.  MEDICAL HISTORY Past Medical History:  Diagnosis Date   Dyslipidemia    Lymphoma (HCC) 03/2021   Seizures (HCC) 2007   2007-last    IMMUNIZATION HISTORY Immunization History  Administered Date(s) Administered   Fluad Quad(high Dose 65+) 03/17/2021, 03/16/2022   Fluad Trivalent(High Dose 65+) 04/05/2023   Fluzone Influenza virus vaccine,trivalent (IIV3), split virus 04/16/2014   INFLUENZA, HIGH DOSE SEASONAL PF 02/28/2024   Influenza,inj,Quad PF,6+ Mos 03/28/2017   Influenza-Unspecified 04/05/2013   Moderna Covid-19 Vaccine Bivalent Booster 8yrs & up 03/09/2021   Moderna Sars-Covid-2 Vaccination 08/24/2019   PFIZER(Purple Top)SARS-COV-2 Vaccination 07/30/2019, 08/27/2019   Pneumococcal  Conjugate-13 04/07/2017   Pneumococcal Polysaccharide-23 09/10/2020   Tdap 07/29/2008, 09/10/2020   Unspecified SARS-COV-2 Vaccination 05/24/2020, 02/23/2021    SURGICAL HISTORY Past Surgical History:  Procedure Laterality Date   CATARACT EXTRACTION W/ INTRAOCULAR LENS IMPLANT Bilateral 2019   CESAREAN SECTION  1981, 1990   COLONOSCOPY  2012   [facility]- normal   COLONOSCOPY WITH PROPOFOL   03/31/2023   [facility]wendlyn Buddy [facility]   NO PAST SURGERIES     TUBAL LIGATION      SOCIAL HISTORY Social History[1]  Social History   Social History Narrative   Retired from working in education officer, environmental at a [employer]   Lives with daughter (who has mental health issues)   Has two children   Widowed    SOCIAL DRIVERS OF HEALTH SDOH Screenings   Food Insecurity: No Food Insecurity (05/21/2024)  Housing: Low Risk (05/21/2024)  Transportation Needs: No Transportation Needs (05/21/2024)  Utilities: Not At Risk (05/23/2024)  Alcohol Screen: Low Risk (05/23/2024)  Depression (PHQ2-9): Low Risk (05/23/2024)  Financial Resource Strain: Low Risk (05/21/2024)  Physical Activity: Insufficiently Active (05/21/2024)  Social Connections: Moderately Integrated (05/21/2024)  Stress: Stress Concern Present (05/21/2024)  Tobacco Use: Low Risk (05/23/2024)  Health Literacy: Adequate Health Literacy (05/23/2024)     FAMILY HISTORY Family History  Problem Relation Age of Onset   Neuropathy Mother    Anuerysm Father    Leukemia Sister 41   Alzheimer's disease Sister 31   Dementia Brother    Heart attack Maternal Grandmother    CVA Maternal Grandfather    Dementia Paternal Grandmother    Seizures Daughter    Other Daughter        Psychological disorder   Colon cancer Neg Hx  Colon polyps Neg Hx    Esophageal cancer Neg Hx    Rectal cancer Neg Hx    Stomach cancer Neg Hx      ALLERGIES: has no known allergies.  MEDICATIONS  Current Outpatient Medications  Medication Sig Dispense Refill    atorvastatin  (LIPITOR) 40 MG tablet TAKE 1 TABLET BY MOUTH EVERY DAY 90 tablet 0   B Complex-C (VITAMIN B + C COMPLEX) TABS      celecoxib  (CELEBREX ) 200 MG capsule Take 1 capsule (200 mg total) by mouth 2 (two) times daily. 60 capsule 0   cetirizine (ZYRTEC) 10 MG tablet Take 10 mg by mouth daily.     LamoTRIgine  300 MG TB24 24 hour tablet Take 1 tablet (300 mg total) by mouth daily. 90 tablet 3   methylPREDNISolone  (MEDROL  DOSEPAK) 4 MG TBPK tablet Take 6 tablets on day 1.  Take 5 tablets on day 2.  Take 4 tablets on day 3.  Take 3 tablets on day 4.  Take 2 tablets on day 5.  Take 1 tablet on day 6. 21 tablet 0   Omega-3 Fatty Acids (FISH OIL) 1000 MG CAPS Take 1 capsule by mouth daily in the afternoon.     Vitamin D , Cholecalciferol, 25 MCG (1000 UT) CAPS Take 1 capsule by mouth daily in the afternoon.     No current facility-administered medications for this visit.    PHYSICAL EXAMINATION: ECOG PERFORMANCE STATUS: 0 - Asymptomatic VITALS: Vitals:   07/04/24 0941  BP: 136/66  Pulse: 67  Resp: 20  Temp: (!) 97.3 F (36.3 C)  SpO2: 98%   Filed Weights   07/04/24 0941  Weight: 176 lb 4.8 oz (80 kg)   Body mass index is 28.46 kg/m.  GENERAL: alert, in no acute distress and comfortable SKIN: no acute rashes, no significant lesions EYES: conjunctiva are pink and non-injected, sclera anicteric OROPHARYNX: MMM, no exudates, no oropharyngeal erythema or ulceration NECK: supple, no JVD LYMPH:  no palpable lymphadenopathy in the cervical, axillary or inguinal regions. (-) Small Cervical Lymph Node measuring 1 cm (WNL) LUNGS: clear to auscultation b/l with normal respiratory effort HEART: regular rate & rhythm ABDOMEN:  normoactive bowel sounds , non tender, not distended, no hepatosplenomegaly Extremity: no pedal edema PSYCH: alert & oriented x 3 with fluent speech NEURO: no focal motor/sensory deficits  LABORATORY DATA:   I have reviewed the data as listed     Latest Ref Rng  & Units 07/04/2024    8:59 AM 05/22/2024   11:22 AM 11/23/2023   12:04 PM  CBC EXTENDED  WBC 4.0 - 10.5 K/uL 6.4  3.6  3.9   RBC 3.87 - 5.11 MIL/uL 4.30  4.17  4.20   Hemoglobin 12.0 - 15.0 g/dL 88.0  88.2  88.2   HCT 36.0 - 46.0 % 36.8  35.7  36.1   Platelets 150 - 400 K/uL 281  257.0  262   NEUT# 1.7 - 7.7 K/uL 3.3  1.9  2.0   Lymph# 0.7 - 4.0 K/uL 2.2  1.0  1.2    Iron Studies: IRON STUDIES Lab Results  Component Value Date   IRON 52 07/04/2024   IRON 79 11/23/2023    Lab Results  Component Value Date   UIBC 217 07/04/2024   UIBC 191 11/23/2023    Lab Results  Component Value Date   TIBC 269 07/04/2024   TIBC 270 11/23/2023    Lab Results  Component Value Date   IRONPCTSAT 19 07/04/2024  IRONPCTSAT 29 11/23/2023    Lab Results  Component Value Date   FERRITIN 58 07/04/2024   FERRITIN 43 11/23/2023     LDH:  Lab Results  Component Value Date   LDH 153 07/04/2024        Latest Ref Rng & Units 07/04/2024    8:59 AM 05/22/2024   11:22 AM 11/23/2023   12:04 PM  CMP  Glucose 70 - 99 mg/dL 95  89  99   BUN 8 - 23 mg/dL 15  12  10    Creatinine 0.44 - 1.00 mg/dL 9.33  9.41  9.33   Sodium 135 - 145 mmol/L 141  142  142   Potassium 3.5 - 5.1 mmol/L 4.2  4.1  4.0   Chloride 98 - 111 mmol/L 105  107  109   CO2 22 - 32 mmol/L 25  27  28    Calcium  8.9 - 10.3 mg/dL 9.2  9.0  9.3   Total Protein 6.5 - 8.1 g/dL 7.2  6.8  6.9   Total Bilirubin 0.0 - 1.2 mg/dL 0.4  0.8  0.6   Alkaline Phos 38 - 126 U/L 85  77  87   AST 15 - 41 U/L 22  21  19    ALT 0 - 44 U/L 21  14  16      PREVIOUS STUDIES:  Component     Latest Ref Rng & Units 04/02/2021  Iron     28 - 170 ug/dL 48  TIBC     749 - 549 ug/dL 765 (L)  Saturation Ratios     10.4 - 31.8 % 20  UIBC     ug/dL 813  Folate, Hemolysate     Not Estab. ng/mL 287.0  HCT     34.0 - 46.6 % 31.5 (L)  Folate, RBC     >498 ng/mL 911  DAT, complement      NEG  DAT, IgG      NEG . . .  Hep B Core Total Ab     NON  REACTIVE NON REACTIVE  Hepatitis B Surface Ag     NON REACTIVE NON REACTIVE  HCV Ab     NON REACTIVE NON REACTIVE  Haptoglobin     37 - 355 mg/dL 825  Ferritin     11 - 307 ng/mL 131  Vitamin B12     180 - 914 pg/mL 789  LDH     98 - 192 U/L 181      SURGICAL PATHOLOGY  CASE: [id number]  PATIENT: Stevee Pilot  Surgical Pathology Report      Clinical History: diffuse LAN, no primary, R/O lymphoma (cm)    FINAL MICROSCOPIC DIAGNOSIS:   A. LYMPH NODE, RIGHT INGUINAL, NEEDLE CORE BIOPSY:  -  Non-Hodgkin B-cell lymphoma  -  See comment   COMMENT:   The biopsy consists of three small lymph node cores composed of a  monotonous proliferation of small to medium sized lymphocytes without  well-formed, back to back follicles.  By immunohistochemistry there is a  slight predominance of B cells by CD20 and CD3 immunohistochemistry.  The B cells appear to co-express CD79a, CD10, BCL6 and Bcl-2 both within  follicles and within the interfollicular spaces. CD23 shows expanded  dendritic meshworks. The B-cells are negative for CD5 and cyclin D1.  Flow cytometry identified a kappa-restricted monoclonal B-cell  population expressing CD10 (42% of all lymphocytes; See [id number]).  Overall, the findings are consistent with a non-Hodgkin B-cell lymphoma,  most likely follicular lymphoma.  FISH for t(14;18) is pending and will  be reported in an addendum along with ki-67.  Unfortunately, given the  limited and fragmented nature of the sample accurate grading of the  lymphoma is not possible on the sample provided.    SURGICAL PATHOLOGY Surgical Pathology   THIS IS AN ADDENDUM REPORT   CASE: [id number]  PATIENT: Savahna Korman  Bone Marrow Report  Addendum    Reason for Addendum #1:  Molecular Add-on   Clinical History: Follicular lymphoma of lymphoma nodes of multiple  regions, unspecified grade (HCC)  ,( BH)    DIAGNOSIS:   BONE MARROW, ASPIRATE, CLOT, CORE:  -   Hypercellular marrow involved by follicular lymphoma  -  See microscopic description and comment below   PERIPHERAL BLOOD:  -  Normocytic anemia  -  See complete blood cell count   COMMENT:   The combined morphologic and flow cytometric features are consistent  with bone marrow involvement by the patient's previously diagnosed  follicular lymphoma.  Correlation with cytogenetics is recommended.     ADDENDUM:   ** Please note this testing was performed and interpreted by an outside  facility ([facility]).  This addendum is only being added to provide a  summary of the results for report completeness.  Please see electronic  medical record for a copy of the full report. **   Karyotype:  46,X,t(X;18)(q13;q11.2),+der(X)t(X;18),add(6)(q12),-11,t(12;13)(q24.1;q1  2),t(14;18)(q32;q21)[2]/46,XX[18]   Interpretation:  ABNORMAL FEMALE KARYOTYPE    The abnormalities include loss of material from the long arm of  chromosome 6 and the t(14;18)(q32;q21) - both recurring abnormalities in  lymphomas. The t(14;18)(q32;q21.3), involving the IGH gene at 14q32.3  and the BCL2 gene at 18q21.3, is observed in 90% of follicular  lymphomas.   RADIOGRAPHIC STUDIES: I have personally reviewed the radiological images as listed and agreed with the findings in the report. DG Lumbar Spine 2-3 Views Result Date: 07/03/2024 CLINICAL DATA:  Intermittent low back pain for several months. EXAM: LUMBAR SPINE - 2-3 VIEW COMPARISON:  None Available. FINDINGS: There is no evidence of lumbar spine fracture. Scoliosis. Mild narrow intervertebral spaces at L2-3, L3-4. Moderate narrow intervertebral space at L4-5. Mild facet joint sclerosis noted throughout the lumbar spine. Mild anterior spurring noted at L4 and L5. 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 Electronically Signed   By: Craig Farr M.D.   On: 07/03/2024 11:04   DG Ankle Complete Right Result Date: 06/14/2024 EXAM: 3 OR  MORE VIEW(S) XRAY OF THE RIGHT ANKLE 05/31/2024 10:56:51 AM CLINICAL HISTORY: right ankle pain COMPARISON: Right foot radiograph 05/05/2018. FINDINGS: BONES AND JOINTS: No acute fracture. No malalignment. SOFT TISSUES: The soft tissues are unremarkable. IMPRESSION: 1. No acute findings. Electronically signed by: Donnice Mania MD 06/14/2024 10:57 PM EST RP Workstation: [id number]    ASSESSMENT & PLAN:   74 y.o. female with  1) Recently diagnosed stage IVa follicular lymphoma PET/CT reviewed as noted above   2) history of seizure disorder on Lamictal    3) normocytic anemia could be from lymphoma hemoglobin is improved to 11.7 from 11.2.  PLAN: - Discussed lab results on 07/04/2024 in detail with patient: CBC showed WBC of 6.4K increased from 3.6K, Hemoglobin of 11.9 increased from 11.7, and PLTs of 281K. CMP stable.  Iron Panel and Ferritin: Iron% 19 and Ferritin 58 LDH 153  - Reviewed 08/10/23 CTA/P:  No aggressive lytic or blastic lesion of bone. Multilevel degenerative changes spine.   Degenerative changes likely contributing to back  pain.  -patient has no lab or clinical indication to initiate treatment of patients follicular lymphoma at this time.   - Provided health maintenance counseling: recommended staying updated with age-recommended vaccinations and cancer screenings  FOLLOW-UP in 6 months for labs and follow-up with Dr. Onesimo.  The total time spent in the appointment was 25 minutes* .  All of the patient's questions were answered and the patient knows to call the clinic with any problems, questions, or concerns.  Emaline Onesimo MD MS AAHIVMS [facility] Hematology/Oncology Physician [facility]  *Total Encounter Time as defined by the Centers for Medicare and Medicaid Services includes, in addition to the face-to-face time of a patient visit (documented in the note above) non-face-to-face time: obtaining and reviewing outside history, ordering and reviewing  medications, tests or procedures, care coordination (communications with other health care professionals or caregivers) and documentation in the medical record.  I,Emily Lagle,acting as a neurosurgeon for Emaline Onesimo, MD.,have documented all relevant documentation on the behalf of Emaline Onesimo, MD,as directed by  Emaline Onesimo, MD while in the presence of Emaline Onesimo, MD.  I have reviewed the above documentation for accuracy and completeness, and I agree with the above.  Emaline Onesimo, MD      [1]  Social History Tobacco Use   Smoking status: Never   Smokeless tobacco: Never  Vaping Use   Vaping status: Never Used  Substance Use Topics   Alcohol use: Not Currently    Alcohol/week: 0.0 standard drinks of alcohol   Drug use: No   "

## 2024-07-11 ENCOUNTER — Encounter: Payer: Self-pay | Admitting: Internal Medicine

## 2024-07-12 ENCOUNTER — Telehealth: Payer: Self-pay | Admitting: Internal Medicine

## 2024-07-12 MED ORDER — ATORVASTATIN CALCIUM 40 MG PO TABS: 40.0000 mg | ORAL_TABLET | Freq: Every day | ORAL | 0 refills | Status: AC

## 2024-07-12 NOTE — Telephone Encounter (Signed)
?*  STAT* If patient is at the pharmacy, call can be transferred to refill team. ? ? ?1. Which medications need to be refilled? (please list name of each medication and dose if known) atorvastatin (LIPITOR) 40 MG tablet ? ?2. Which pharmacy/location (including street and city if local pharmacy) is medication to be sent to? [id number] [place], [place] - [address] ? ?3. Do they need a 30 day or 90 day supply? 90 ? ?

## 2024-07-16 NOTE — Progress Notes (Unsigned)
 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 Mona on 05/14/2022.  She history of high cholesterol but had not been on lipid-lowering therapy.  Her mother also had high cholesterol and suffered from neuropathy and had side effects related to statins therefore patient has been hesitant to take them.  Lipid panel at that time revealed total cholesterol 292, triglycerides 107, HDL 68, and LDL 203.  CT pulmonary angiogram showed coronary artery calcification and aortic atherosclerosis, therefore LDL goal is 70 or lower.  She was advised to start atorvastatin  40 mg daily. Was taking lamotrigine  but no interaction with atorvastatin  was found.  LP(a) found to be significantly elevated at 286.8 nmol/L.  Seen in follow-up by Dr. Mona on 12/17/2022 with good response to atorvastatin  with LDL down to 93, particle #[id number], HDL 66, and triglycerides 69.  She was tolerating atorvastatin  without concerning side effects.  Recommendation to add PCSK9 inhibitor therapy.  Felt to be a good candidate for Leqvio .  NMR 07/11/2023 with LDL particle #1439, LDL-C 878, HDL-C 69, triglycerides 69, total cholesterol 202 and small LDL-P 410.  She reported missing a few doses of atorvastatin  and had not started Leqvio .  Orders submitted to start Leqvio  therapy.  Lipid panel 05/22/24 with total cholesterol 153, triglycerides 99, HDL 58.50, LDL 75.   History of Present Illness Discussed the use of AI scribe software for clinical note transcription with the patient, who gave verbal consent to proceed.  History of Present Illness Katie Mcneil is a very pleasant 74 year old female who is here today  for follow-up of hyperlipidemia.  She received her last Leqvio  injection on 06/19/2024.  She has been on Leqvio  for approximately 1 year and has had no adverse side effects.  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  She walks outside of her home around her neighborhood for exercise when the weather allows.  She denies chest pain, palpitations, edema, orthopnea, PND, presyncope, syncope. 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 No recent concerns with blood pressure or heart rate. She has coronary artery calcification on prior CT scans.  She generally eats a healthy diet and takes over-the-counter fish oil and also uses turmeric and ginger root with honey.   ROS: See HPI  Studies Reviewed EKG Interpretation Date/Time:  Wednesday July 18 2024 09:03:50 EST Ventricular Rate:  73 PR Interval:  140 QRS Duration:  86 QT Interval:  386 QTC Calculation: 425 R Axis:   22  Text Interpretation: Normal sinus rhythm Normal ECG When compared with ECG of 23-Mar-2021 15:29, ST no longer depressed in Anterior leads Nonspecific T wave abnormality no longer evident in Inferior leads Nonspecific T wave abnormality, improved in Anterolateral leads Confirmed by Percy Browning [phone]) on 07/18/2024 9:11:40 AM     Lipoprotein (a)  Date/Time Value Ref Range Status  05/19/2023 11:43 AM 314 (H) <75 nmol/L Final    Comment:    . Risk Category   Optimal        < 75 nmol/L   Moderate   75 - 125 nmol/L   High          >  125 nmol/L . Cardiovascular event risk category cut points (optimal, moderate, high) are based on Tsimika S. JACC 2017;69:692-711. .   05/14/2022 11:58 AM 286.8 (H) <75.0 nmol/L Final    Comment:    **Results verified by repeat testing** Note:  Values greater than or equal to 75.0 nmol/L may        indicate an independent risk factor for CHD,        but must be  evaluated with caution when applied        to non-Caucasian populations due to the        influence of genetic factors on Lp(a) across        ethnicities.     Risk Assessment/Calculations           Physical Exam VS:  BP 124/70 (BP Location: Right Arm, Patient Position: Sitting, Cuff Size: Normal)   Pulse 73   Ht 5' 6 (1.676 m)   Wt 177 lb 11.2 oz (80.6 kg)   SpO2 96%   BMI 28.68 kg/m    Wt Readings from Last 3 Encounters:  07/18/24 177 lb 11.2 oz (80.6 kg)  07/04/24 176 lb 4.8 oz (80 kg)  06/28/24 178 lb (80.7 kg)    GEN: Well nourished, well developed in no acute distress NECK: No JVD; No carotid bruits CARDIAC: RRR, no murmurs, rubs, gallops RESPIRATORY:  Clear to auscultation without rales, wheezing or rhonchi  ABDOMEN: Soft, non-tender, non-distended EXTREMITIES:  No edema; No deformity   Assessment & Plan Elevated lipoprotein(a)   Familial hyperlipidemia Hyperlipidemia LDL goal < 70 Lipoprotein(a) levels remain elevated, though Inclisiran is well-tolerated and slightly reduces lipoprotein(a).  LDL cholesterol has improved significantly since starting Leqvio  with most recent LDL at 75 mg/dL. LDL goal < 70 given history of coronary calcification, however she is already on high dose statin therapy. No concerning side effects on lipid lowering therapy.   - Continue Leqvio  - Continue atorvastatin  daily - Encourage regular exercise aiming for at least 150 minutes of moderate intensity exercise each week - Continue heart healthy diet to limit saturated fat intake to 10% of total calories or less, minimize sugar, and consume whole foods  Coronary artery calcification  Aortic atherosclerosis Cardiac risk Previous CT scans noted coronary artery calcification, aortic atherosclerosis. No symptoms concerning for angina. EKG today shows no ST/T abnormality.  No indication for further ischemia evaluation at this time. - Heart healthy lifestyle including diet and exercise  encouraged         Dispo: 1 year with Dr. Mona or me  Signed, Rosaline Bane, NP-C "

## 2024-07-17 ENCOUNTER — Ambulatory Visit
Admission: RE | Admit: 2024-07-17 | Discharge: 2024-07-17 | Disposition: A | Source: Ambulatory Visit | Attending: Sports Medicine

## 2024-07-17 DIAGNOSIS — R200 Anesthesia of skin: Secondary | ICD-10-CM

## 2024-07-17 DIAGNOSIS — G8929 Other chronic pain: Secondary | ICD-10-CM

## 2024-07-18 ENCOUNTER — Ambulatory Visit (INDEPENDENT_AMBULATORY_CARE_PROVIDER_SITE_OTHER): Admitting: Nurse Practitioner

## 2024-07-18 ENCOUNTER — Encounter (HOSPITAL_BASED_OUTPATIENT_CLINIC_OR_DEPARTMENT_OTHER): Payer: Self-pay | Admitting: Nurse Practitioner

## 2024-07-18 VITALS — BP 124/70 | HR 73 | Ht 66.0 in | Wt 177.7 lb

## 2024-07-18 DIAGNOSIS — I700 Atherosclerosis of aorta: Secondary | ICD-10-CM

## 2024-07-18 DIAGNOSIS — Z7189 Other specified counseling: Secondary | ICD-10-CM | POA: Diagnosis not present

## 2024-07-18 DIAGNOSIS — E7841 Elevated Lipoprotein(a): Secondary | ICD-10-CM

## 2024-07-18 DIAGNOSIS — E7849 Other hyperlipidemia: Secondary | ICD-10-CM

## 2024-07-18 DIAGNOSIS — E785 Hyperlipidemia, unspecified: Secondary | ICD-10-CM

## 2024-07-18 DIAGNOSIS — I2510 Atherosclerotic heart disease of native coronary artery without angina pectoris: Secondary | ICD-10-CM | POA: Diagnosis not present

## 2024-07-18 NOTE — Patient Instructions (Signed)
 Medication Instructions:   Your physician recommends that you continue on your current medications as directed. Please refer to the Current Medication list given to you today.   *If you need a refill on your cardiac medications before your next appointment, please call your pharmacy*  Lab Work:  None ordered.  If you have labs (blood work) drawn today and your tests are completely normal, you will receive your results only by: MyChart Message (if you have MyChart) OR A paper copy in the mail If you have any lab test that is abnormal or we need to change your treatment, we will call you to review the results.  Testing/Procedures:  None ordered.  Follow-Up: At [facility], you and your health needs are our priority.  As part of our continuing mission to provide you with exceptional heart care, our providers are all part of one team.  This team includes your primary Cardiologist (physician) and Advanced Practice Providers or APPs (Physician Assistants and Nurse Practitioners) who all work together to provide you with the care you need, when you need it.  Your next appointment:   1 year(s)  Provider:   Slater Duncan, NP    We recommend signing up for the patient portal called "MyChart".  Sign up information is provided on this After Visit Summary.  MyChart is used to connect with patients for Virtual Visits (Telemedicine).  Patients are able to view lab/test results, encounter notes, upcoming appointments, etc.  Non-urgent messages can be sent to your provider as well.   To learn more about what you can do with MyChart, go to [email or web address].   Other Instructions  Your physician wants you to follow-up in: 1 year.  You will receive a reminder letter in the mail two months in advance. If you don't receive a letter, please call our office to schedule the follow-up appointment.

## 2024-11-28 ENCOUNTER — Other Ambulatory Visit

## 2024-11-28 ENCOUNTER — Ambulatory Visit: Admitting: Hematology

## 2024-12-11 ENCOUNTER — Ambulatory Visit

## 2025-01-02 ENCOUNTER — Inpatient Hospital Stay: Admitting: Hematology

## 2025-01-02 ENCOUNTER — Inpatient Hospital Stay

## 2025-02-14 ENCOUNTER — Telehealth: Admitting: Adult Health

## 2025-05-24 ENCOUNTER — Ambulatory Visit: Admitting: Physician Assistant
